# Patient Record
Sex: Female | Born: 1950
Health system: Southern US, Community
[De-identification: ages and names within clinical notes are randomized; demographics above are authoritative.]

## PROBLEM LIST (undated history)

## (undated) DIAGNOSIS — F419 Anxiety disorder, unspecified: Secondary | ICD-10-CM

## (undated) DIAGNOSIS — I639 Cerebral infarction, unspecified: Secondary | ICD-10-CM

## (undated) DIAGNOSIS — M1990 Unspecified osteoarthritis, unspecified site: Secondary | ICD-10-CM

## (undated) DIAGNOSIS — N6019 Diffuse cystic mastopathy of unspecified breast: Secondary | ICD-10-CM

## (undated) DIAGNOSIS — K219 Gastro-esophageal reflux disease without esophagitis: Secondary | ICD-10-CM

## (undated) DIAGNOSIS — F32A Depression, unspecified: Secondary | ICD-10-CM

## (undated) DIAGNOSIS — E785 Hyperlipidemia, unspecified: Secondary | ICD-10-CM

## (undated) DIAGNOSIS — S82209A Unspecified fracture of shaft of unspecified tibia, initial encounter for closed fracture: Secondary | ICD-10-CM

## (undated) DIAGNOSIS — M8580 Other specified disorders of bone density and structure, unspecified site: Secondary | ICD-10-CM

## (undated) DIAGNOSIS — T7840XA Allergy, unspecified, initial encounter: Secondary | ICD-10-CM

## (undated) DIAGNOSIS — E669 Obesity, unspecified: Secondary | ICD-10-CM

## (undated) HISTORY — DX: Unspecified osteoarthritis, unspecified site: M19.90

## (undated) HISTORY — PX: BREAST CYST ASPIRATION: SHX578

## (undated) HISTORY — DX: Depression, unspecified: F32.A

## (undated) HISTORY — DX: Unspecified fracture of shaft of unspecified tibia, initial encounter for closed fracture: S82.209A

## (undated) HISTORY — DX: Obesity, unspecified: E66.9

## (undated) HISTORY — PX: TUBAL LIGATION: SHX77

## (undated) HISTORY — DX: Diffuse cystic mastopathy of unspecified breast: N60.19

## (undated) HISTORY — DX: Allergy, unspecified, initial encounter: T78.40XA

## (undated) HISTORY — DX: Cerebral infarction, unspecified: I63.9

## (undated) HISTORY — DX: Hyperlipidemia, unspecified: E78.5

## (undated) HISTORY — DX: Anxiety disorder, unspecified: F41.9

## (undated) HISTORY — DX: Other specified disorders of bone density and structure, unspecified site: M85.80

## (undated) HISTORY — DX: Gastro-esophageal reflux disease without esophagitis: K21.9

---
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
## 1974-08-03 HISTORY — PX: TUBAL LIGATION: SHX77

## 2000-10-21 ENCOUNTER — Encounter: Admission: RE | Admit: 2000-10-21 | Discharge: 2000-10-21 | Payer: Self-pay | Admitting: Obstetrics and Gynecology

## 2000-10-21 ENCOUNTER — Encounter: Payer: Self-pay | Admitting: Obstetrics and Gynecology

## 2005-09-16 ENCOUNTER — Ambulatory Visit: Payer: Self-pay | Admitting: Family Medicine

## 2005-10-26 ENCOUNTER — Encounter: Admission: RE | Admit: 2005-10-26 | Discharge: 2005-10-26 | Payer: Self-pay | Admitting: Family Medicine

## 2005-11-24 ENCOUNTER — Ambulatory Visit: Payer: Self-pay | Admitting: Family Medicine

## 2005-11-24 ENCOUNTER — Encounter: Payer: Self-pay | Admitting: Family Medicine

## 2005-11-24 ENCOUNTER — Other Ambulatory Visit: Admission: RE | Admit: 2005-11-24 | Discharge: 2005-11-24 | Payer: Self-pay | Admitting: Family Medicine

## 2005-12-08 ENCOUNTER — Ambulatory Visit: Payer: Self-pay | Admitting: Family Medicine

## 2006-03-25 ENCOUNTER — Ambulatory Visit: Payer: Self-pay | Admitting: Family Medicine

## 2006-08-24 ENCOUNTER — Ambulatory Visit: Payer: Self-pay | Admitting: Family Medicine

## 2006-09-24 ENCOUNTER — Ambulatory Visit: Payer: Self-pay | Admitting: Family Medicine

## 2006-12-13 ENCOUNTER — Encounter: Payer: Self-pay | Admitting: Family Medicine

## 2006-12-13 DIAGNOSIS — E785 Hyperlipidemia, unspecified: Secondary | ICD-10-CM | POA: Insufficient documentation

## 2006-12-13 DIAGNOSIS — J309 Allergic rhinitis, unspecified: Secondary | ICD-10-CM | POA: Insufficient documentation

## 2006-12-13 DIAGNOSIS — K219 Gastro-esophageal reflux disease without esophagitis: Secondary | ICD-10-CM | POA: Insufficient documentation

## 2006-12-13 DIAGNOSIS — M1990 Unspecified osteoarthritis, unspecified site: Secondary | ICD-10-CM | POA: Insufficient documentation

## 2006-12-22 ENCOUNTER — Ambulatory Visit: Payer: Self-pay | Admitting: Family Medicine

## 2006-12-23 ENCOUNTER — Encounter: Payer: Self-pay | Admitting: Family Medicine

## 2006-12-24 LAB — CONVERTED CEMR LAB
ALT: 17 units/L (ref 0–35)
Albumin: 4.5 g/dL (ref 3.5–5.2)
Basophils Absolute: 0.1 10*3/uL (ref 0.0–0.1)
Basophils Relative: 1 % (ref 0–1)
Chloride: 102 meq/L (ref 96–112)
Cholesterol: 194 mg/dL (ref 0–200)
Eosinophils Absolute: 0.1 10*3/uL (ref 0.0–0.7)
HDL: 47 mg/dL (ref >39)
MCHC: 33.8 g/dL (ref 30.0–36.0)
MCV: 90 fL (ref 78.0–100.0)
Neutro Abs: 5 10*3/uL (ref 1.7–7.7)
Neutrophils Relative %: 66 % (ref 43–77)
Platelets: 245 10*3/uL (ref 150–400)
Potassium: 4.3 meq/L (ref 3.5–5.3)
Sodium: 138 meq/L (ref 135–145)
Total CHOL/HDL Ratio: 4.1
Total Protein: 7.5 g/dL (ref 6.0–8.3)
Triglycerides: 83 mg/dL (ref <150)
VLDL: 17 mg/dL (ref 0–40)

## 2006-12-25 LAB — CONVERTED CEMR LAB: OCCULT 2: NEGATIVE

## 2007-01-03 ENCOUNTER — Ambulatory Visit: Payer: Self-pay | Admitting: Family Medicine

## 2007-01-05 ENCOUNTER — Encounter (INDEPENDENT_AMBULATORY_CARE_PROVIDER_SITE_OTHER): Payer: Self-pay | Admitting: *Deleted

## 2007-01-07 ENCOUNTER — Encounter: Admission: RE | Admit: 2007-01-07 | Discharge: 2007-01-07 | Payer: Self-pay | Admitting: Family Medicine

## 2007-01-14 ENCOUNTER — Encounter (INDEPENDENT_AMBULATORY_CARE_PROVIDER_SITE_OTHER): Payer: Self-pay | Admitting: *Deleted

## 2007-06-28 ENCOUNTER — Ambulatory Visit: Payer: Self-pay | Admitting: Family Medicine

## 2007-09-30 ENCOUNTER — Ambulatory Visit: Payer: Self-pay | Admitting: Family Medicine

## 2007-12-27 ENCOUNTER — Ambulatory Visit: Payer: Self-pay | Admitting: Family Medicine

## 2007-12-28 LAB — CONVERTED CEMR LAB
ALT: 14 units/L (ref 0–35)
Alkaline Phosphatase: 82 units/L (ref 39–117)
BUN: 17 mg/dL (ref 6–23)
Basophils Relative: 1 % (ref 0–1)
Cholesterol: 186 mg/dL (ref 0–200)
Creatinine, Ser: 0.8 mg/dL (ref 0.40–1.20)
Eosinophils Absolute: 0.1 10*3/uL (ref 0.0–0.7)
Indirect Bilirubin: 0.3 mg/dL (ref 0.0–0.9)
MCHC: 34 g/dL (ref 30.0–36.0)
MCV: 90.1 fL (ref 78.0–100.0)
Monocytes Absolute: 0.4 10*3/uL (ref 0.1–1.0)
Monocytes Relative: 5 % (ref 3–12)
Neutrophils Relative %: 70 % (ref 43–77)
Potassium: 4.9 meq/L (ref 3.5–5.3)
RBC: 4.76 M/uL (ref 3.87–5.11)
RDW: 13.5 % (ref 11.5–15.5)
Total Protein: 7.1 g/dL (ref 6.0–8.3)
Triglycerides: 56 mg/dL (ref <150)

## 2008-01-11 ENCOUNTER — Encounter: Admission: RE | Admit: 2008-01-11 | Discharge: 2008-01-11 | Payer: Self-pay | Admitting: Family Medicine

## 2008-01-13 ENCOUNTER — Encounter (INDEPENDENT_AMBULATORY_CARE_PROVIDER_SITE_OTHER): Payer: Self-pay | Admitting: *Deleted

## 2008-01-16 ENCOUNTER — Encounter (INDEPENDENT_AMBULATORY_CARE_PROVIDER_SITE_OTHER): Payer: Self-pay | Admitting: *Deleted

## 2008-01-20 ENCOUNTER — Ambulatory Visit: Payer: Self-pay | Admitting: Family Medicine

## 2008-07-17 ENCOUNTER — Ambulatory Visit: Payer: Self-pay | Admitting: Family Medicine

## 2008-07-17 LAB — CONVERTED CEMR LAB
Bilirubin Urine: NEGATIVE
Epithelial cells, urine: 1 /lpf
Ketones, urine, test strip: NEGATIVE
Protein, U semiquant: NEGATIVE
RBC / HPF: 0
Urobilinogen, UA: 0.2
WBC, UA: 0 cells/hpf

## 2008-07-19 ENCOUNTER — Encounter: Admission: RE | Admit: 2008-07-19 | Discharge: 2008-07-19 | Payer: Self-pay | Admitting: Family Medicine

## 2008-07-20 ENCOUNTER — Telehealth (INDEPENDENT_AMBULATORY_CARE_PROVIDER_SITE_OTHER): Payer: Self-pay | Admitting: *Deleted

## 2009-02-08 ENCOUNTER — Encounter: Admission: RE | Admit: 2009-02-08 | Discharge: 2009-02-08 | Payer: Self-pay | Admitting: Family Medicine

## 2009-02-11 ENCOUNTER — Encounter (INDEPENDENT_AMBULATORY_CARE_PROVIDER_SITE_OTHER): Payer: Self-pay | Admitting: *Deleted

## 2009-03-12 ENCOUNTER — Encounter: Payer: Self-pay | Admitting: Family Medicine

## 2009-03-12 ENCOUNTER — Other Ambulatory Visit: Admission: RE | Admit: 2009-03-12 | Discharge: 2009-03-12 | Payer: Self-pay | Admitting: Family Medicine

## 2009-03-12 ENCOUNTER — Ambulatory Visit: Payer: Self-pay | Admitting: Family Medicine

## 2009-03-12 DIAGNOSIS — M8580 Other specified disorders of bone density and structure, unspecified site: Secondary | ICD-10-CM | POA: Insufficient documentation

## 2009-03-12 LAB — HM PAP SMEAR

## 2009-03-14 LAB — CONVERTED CEMR LAB
AST: 17 units/L (ref 0–37)
BUN: 16 mg/dL (ref 6–23)
CO2: 24 meq/L (ref 19–32)
Calcium: 9.4 mg/dL (ref 8.4–10.5)
Chloride: 106 meq/L (ref 96–112)
Cholesterol: 202 mg/dL — ABNORMAL HIGH (ref 0–200)
Creatinine, Ser: 0.86 mg/dL (ref 0.40–1.20)
Eosinophils Absolute: 0.1 10*3/uL (ref 0.0–0.7)
Eosinophils Relative: 2 % (ref 0–5)
Glucose, Bld: 95 mg/dL (ref 70–99)
HCT: 41.3 % (ref 36.0–46.0)
HDL: 49 mg/dL (ref >39)
Lymphs Abs: 1.8 10*3/uL (ref 0.7–4.0)
MCV: 89.4 fL (ref 78.0–100.0)
Monocytes Absolute: 0.4 10*3/uL (ref 0.1–1.0)
Platelets: 238 10*3/uL (ref 150–400)
RDW: 13.2 % (ref 11.5–15.5)
Total CHOL/HDL Ratio: 4.1
Triglycerides: 88 mg/dL (ref <150)
WBC: 7 10*3/uL (ref 4.0–10.5)

## 2009-03-15 ENCOUNTER — Encounter (INDEPENDENT_AMBULATORY_CARE_PROVIDER_SITE_OTHER): Payer: Self-pay | Admitting: *Deleted

## 2009-03-21 ENCOUNTER — Encounter (INDEPENDENT_AMBULATORY_CARE_PROVIDER_SITE_OTHER): Payer: Self-pay | Admitting: *Deleted

## 2009-03-27 ENCOUNTER — Ambulatory Visit: Payer: Self-pay | Admitting: Family Medicine

## 2009-03-27 LAB — CONVERTED CEMR LAB
OCCULT 2: NEGATIVE
OCCULT 3: NEGATIVE

## 2009-03-28 ENCOUNTER — Encounter (INDEPENDENT_AMBULATORY_CARE_PROVIDER_SITE_OTHER): Payer: Self-pay | Admitting: *Deleted

## 2009-03-28 LAB — FECAL OCCULT BLOOD, GUAIAC: Fecal Occult Blood: NEGATIVE

## 2010-02-14 ENCOUNTER — Encounter: Admission: RE | Admit: 2010-02-14 | Discharge: 2010-02-14 | Payer: Self-pay | Admitting: Family Medicine

## 2010-02-19 ENCOUNTER — Encounter: Payer: Self-pay | Admitting: Family Medicine

## 2010-03-13 ENCOUNTER — Ambulatory Visit: Payer: Self-pay | Admitting: Family Medicine

## 2010-03-14 ENCOUNTER — Telehealth (INDEPENDENT_AMBULATORY_CARE_PROVIDER_SITE_OTHER): Payer: Self-pay | Admitting: *Deleted

## 2010-03-14 LAB — CONVERTED CEMR LAB
CO2: 26 meq/L (ref 19–32)
Calcium: 9.7 mg/dL (ref 8.4–10.5)
Chloride: 105 meq/L (ref 96–112)
Cholesterol: 189 mg/dL (ref 0–200)
Creatinine, Ser: 0.82 mg/dL (ref 0.40–1.20)
Eosinophils Relative: 2 % (ref 0–5)
Glucose, Bld: 92 mg/dL (ref 70–99)
HCT: 41.7 % (ref 36.0–46.0)
Hemoglobin: 14.2 g/dL (ref 12.0–15.0)
Lymphocytes Relative: 33 % (ref 12–46)
Lymphs Abs: 2.2 10*3/uL (ref 0.7–4.0)
Monocytes Absolute: 0.5 10*3/uL (ref 0.1–1.0)
Neutro Abs: 3.8 10*3/uL (ref 1.7–7.7)
RBC: 4.61 M/uL (ref 3.87–5.11)
Total Bilirubin: 0.6 mg/dL (ref 0.3–1.2)
Total CHOL/HDL Ratio: 3.7
Total Protein: 6.5 g/dL (ref 6.0–8.3)
Triglycerides: 63 mg/dL (ref <150)
VLDL: 13 mg/dL (ref 0–40)
Vit D, 25-Hydroxy: 42 ng/mL (ref 30–89)
WBC: 6.7 10*3/uL (ref 4.0–10.5)

## 2010-03-24 ENCOUNTER — Ambulatory Visit: Payer: Self-pay | Admitting: Family Medicine

## 2010-03-26 ENCOUNTER — Encounter: Payer: Self-pay | Admitting: Family Medicine

## 2010-03-31 ENCOUNTER — Ambulatory Visit: Payer: Self-pay | Admitting: Family Medicine

## 2010-03-31 ENCOUNTER — Encounter: Admission: RE | Admit: 2010-03-31 | Discharge: 2010-03-31 | Payer: Self-pay | Admitting: Family Medicine

## 2010-04-01 ENCOUNTER — Encounter (INDEPENDENT_AMBULATORY_CARE_PROVIDER_SITE_OTHER): Payer: Self-pay | Admitting: *Deleted

## 2010-04-01 LAB — CONVERTED CEMR LAB: Fecal Occult Bld: NEGATIVE

## 2010-07-02 ENCOUNTER — Ambulatory Visit: Payer: Self-pay | Admitting: Family Medicine

## 2010-08-03 DIAGNOSIS — S82209A Unspecified fracture of shaft of unspecified tibia, initial encounter for closed fracture: Secondary | ICD-10-CM

## 2010-08-03 HISTORY — DX: Unspecified fracture of shaft of unspecified tibia, initial encounter for closed fracture: S82.209A

## 2010-08-03 HISTORY — PX: COLONOSCOPY: SHX174

## 2010-09-02 NOTE — Letter (Signed)
Summary: [facility]: Immunoassay Fecal Occult Blood (iFOB) Order Form  [facility]  [address] [place], [place] [address]   Phone: [phone]  Fax: [phone]      [facility]: Immunoassay Fecal Occult Blood (iFOB) Order Form   March 24, 2010 MRN: [id number]   Brianna Espinoza 15-Jul-1951   Physicican Name:______Tower___________________  Diagnosis Code:__________V76.51________________      Judith Part MD

## 2010-09-02 NOTE — Assessment & Plan Note (Signed)
Summary: F/U DISCUSS BONE DENSITY RESULTS/RI   Vital Signs:  Patient profile:   60 year old female Height:      61 inches Weight:      168.25 pounds BMI:     31.91 Temp:     98.3 degrees F oral Pulse rate:   68 / minute Pulse rhythm:   regular BP sitting:   132 / 84  (left arm) Cuff size:   regular  Vitals Entered By: Lewanda Rife LPN (July 02, 2010 8:05 AM) CC: discuss bone density   History of Present Illness: here to disc dexa showing osteopenia  LS and FN both showed T scores of -1.6   does have OP in family- her mother   she herself broke wrist on ice in 1985  no other breaks   exercises every day -- which is good     on ca and D vit D level is 42-- went up from year before  4 calcium gummies per day -- ? mg    suspect 1200 mg ca per day and also vit D 2000 international units   she thinks her insurance would cover fosamax    bp is labile at home but mostly below 140/90 disc checking only when relaxed- not just after exercise    Allergies: 1)  ! * Estrogen Cream 2)  * Tetanus 3)  Tylenol  Past History:  Past Medical History: Last updated: 03/24/2010 Allergic rhinitis GERD Hyperlipidemia Osteoarthritis fibrocystic breasts  obesity osteopenia    dermPurcell Nails   Past Surgical History: Last updated: 09/30/2007 Tubal ligation/ Lap (1976) Right hernia repair - very young Aspriated breast lump (1980"s)  Family History: Last updated: 03/24/2010 Father: CVA, prostate cancer, CAD- died of MI Mother: Arthritis, increased chol, HTN, OP (died of pneumonia) Siblings:  both grandparents lived to be 67  Social History: Last updated: 12/27/2007 Marital Status: Married Children: 1 Occupation: works at [employer] for exercise, also walks and gardens   Risk Factors: Smoking Status: never (12/13/2006)  Review of Systems General:  Denies fatigue, loss of appetite, and malaise. Eyes:  Denies blurring and eye irritation. CV:  Denies  chest pain or discomfort, lightheadness, palpitations, and shortness of breath with exertion. Resp:  Denies cough. MS:  Denies joint pain, joint redness, joint swelling, and stiffness. Derm:  Denies itching, lesion(s), poor wound healing, and rash. Neuro:  Denies numbness and tingling. Psych:  mood is ok . Endo:  Denies cold intolerance, excessive thirst, excessive urination, and heat intolerance. Heme:  Denies abnormal bruising and bleeding.  Physical Exam  General:  overweight but generally well appearing  Head:  normocephalic, atraumatic, and no abnormalities observed.   Mouth:  pharynx pink and moist.   Neck:  supple with full rom and no masses or thyromegally, no JVD or carotid bruit  Lungs:  Normal respiratory effort, chest expands symmetrically. Lungs are clear to auscultation, no crackles or wheezes. Heart:  Normal rate and regular rhythm. S1 and S2 normal without gallop, murmur, click, rub or other extra sounds. Msk:  no kyphosis no acute joint changes  petite frame Extremities:  No clubbing, cyanosis, edema, or deformity noted with normal full range of motion of all joints.   Skin:  Intact without suspicious lesions or rashes Cervical Nodes:  No lymphadenopathy noted Psych:  normal affect, talkative and pleasant    Impression & Recommendations:  Problem # 1:  OSTEOPENIA (ICD-733.90) Assessment Deteriorated  this has worsened in light of petite frame/ mother with  OP and also T score under -1.5 will tx with bisphosphenate  if side eff- primarily GI- will call and update disc imp of exercise and ca and d-- and will re check dexa in 2 y recent dexa rev in detail Her updated medication list for this problem includes:    Fosamax 70 Mg Tabs (Alendronate sodium) .Marland Kitchen... 1 by mouth once daily  Orders: Prescription Created Electronically [phone])  Complete Medication List: 1)  Multiple Vitamins Tabs (Multiple vitamin) .... Take one by mouth as directed 2)  Prilosec 20 Mg Cpdr  (Omeprazole) .Marland Kitchen.. 1 by mouth every day as needed 3)  Vitamin D 1200 Mg  .... Take 1 tablet by mouth once a day 4)  Calcium With Vitamin D Gummies  .... Chew four gummies a day 5)  Chlor-trimeton 4 Mg Tabs (Chlorpheniramine maleate) .... Otc as directed. 6)  Fosamax 70 Mg Tabs (Alendronate sodium) .Marland Kitchen.. 1 by mouth once daily  Patient Instructions: 1)  the current recommendation for calcium intake is 1200-1500 mg daily with 2000  IU of vitamin D  2)  try the fosamax once weekly 3)  if any side effects like reflux/ chest pain or problems swallowing  4)  continue the exercise  5)  we will plan next dexa in 2 years Prescriptions: FOSAMAX 70 MG TABS (ALENDRONATE SODIUM) 1 by mouth once daily  #4 x 11   Entered and Authorized by:   Judith Part MD   Signed by:   Judith Part MD on 07/02/2010   Method used:   Electronically to        [facility] [phone]* (retail)       [address] [place], [place]  [address]       Ph: [phone]       Fax: [phone]   RxID:   [id number]    Orders Added: 1)  Prescription Created Electronically [G8553] 2)  Est. Patient Level III [40347]    Current Allergies (reviewed today): ! * ESTROGEN CREAM * TETANUS TYLENOL

## 2010-09-02 NOTE — Miscellaneous (Signed)
Summary: mammogram screening  Clinical Lists Changes  Observations: Added new observation of MAMMO DUE: 02/2011 (02/19/2010 16:55) Added new observation of MAMMOGRAM: normal (02/14/2010 16:55)      Preventive Care Screening  Mammogram:    Date:  02/14/2010    Next Due:  02/2011    Results:  normal

## 2010-09-02 NOTE — Progress Notes (Signed)
----   Converted from flag ---- ---- 03/14/2010 8:54 AM, Colon Flattery Tower MD wrote: please check lipid, wellness and vit D v70.0 and 733.0  ---- 03/12/2010 12:58 PM, Liane Comber CMA (AAMA) wrote: Pt is scheduled for cpx labs tomorrow, what labs to draw and dx codes? Thanks Tasha ------------------------------

## 2010-09-02 NOTE — Letter (Signed)
Summary: Results Follow up Letter  [facility]  [address] [place], [place] [address]   Phone: [phone]  Fax: [phone]    02/19/2010 MRN: [id number]    [facility] [place] [address] [place], [place]  [address]    Dear Ms. Coupland,  The following are the results of your recent test(s):  Test         Result    Pap Smear:        Normal _____  Not Normal _____ Comments: ______________________________________________________ Cholesterol: LDL(Bad cholesterol):         Your goal is less than:         HDL (Good cholesterol):       Your goal is more than: Comments:  ______________________________________________________ Mammogram:        Normal __X___  Not Normal _____ Comments:Repeat in one year.   ___________________________________________________________________ Hemoccult:        Normal _____  Not normal _______ Comments:    _____________________________________________________________________ Other Tests:    We routinely do not discuss normal results over the telephone.  If you desire a copy of the results, or you have any questions about this information we can discuss them at your next office visit.   Sincerely,    Idamae Schuller Dakoda [place],MD  MT/ri

## 2010-09-02 NOTE — Letter (Signed)
Summary: Results Follow up Letter  [facility]  [address] [place], [place] [address]   Phone: [phone]  Fax: [phone]    04/01/2010 MRN: [id number]    [facility] [place] [address] [place], [place]  [address]    Dear Ms. Forry,  The following are the results of your recent test(s):  Test         Result    Pap Smear:        Normal _____  Not Normal _____ Comments: ______________________________________________________ Cholesterol: LDL(Bad cholesterol):         Your goal is less than:         HDL (Good cholesterol):       Your goal is more than: Comments:  ______________________________________________________ Mammogram:        Normal _____  Not Normal _____ Comments:  ___________________________________________________________________ Hemoccult:        Normal __X___  Not normal _______ Comments:    _____________________________________________________________________ Other Tests:    We routinely do not discuss normal results over the telephone.  If you desire a copy of the results, or you have any questions about this information we can discuss them at your next office visit.   Sincerely,    Dwana Curd. Para March, M.D.  [facility]

## 2010-09-02 NOTE — Assessment & Plan Note (Signed)
Summary: CPX/CLE  R/S FROM 03/20/10   Vital Signs:  Patient profile:   60 year old female Height:      61 inches Weight:      168.25 pounds BMI:     31.91 Temp:     98.1 degrees F oral Pulse rate:   76 / minute Pulse rhythm:   regular BP sitting:   160 / 84  (left arm) Cuff size:   regular  Vitals Entered By: Lewanda Rife LPN (March 24, 2010 11:39 AM)  Serial Vital Signs/Assessments:  Time      Position  BP       Pulse  Resp  Temp     By                     130/80                         Judith Part MD  CC: 30 minute check up   History of Present Illness: here for wellness exam and to review chronic health problems feels good overall   had a rough year this year -- lost her mother - fall and pneumonia  hard on an only child  was in a funk- now getting back to nl  used hospice counselor  now has estate issues to deal with   bp is high today --160/84 has not checked outside the office - no symptoms at all  walks 5 days per week/ golf 3 d per week is thinking about retiring   wt is up 4lb (gained 13 and lost some)   lipids are ok trig 63 and HDL 51 and LDL 125- this is down a bit from last year  hx of fibrocystic breasts  mam was 7/11 self exam -- no new lumps or changes   hx of osteopenia  due dexa in this summer  on ca and vit D D level nl at 42 this check  Td allergic   colonosc - prev declined -- cannot tolerate prep    pap was 8/10 - normal  no hx of abn paps no symptoms or new partners       Allergies: 1)  ! * Estrogen Cream 2)  * Tetanus 3)  Tylenol  Past History:  Past Surgical History: Last updated: 09/30/2007 Tubal ligation/ Lap (1976) Right hernia repair - very young Aspriated breast lump (1980"s)  Family History: Last updated: 03/24/2010 Father: CVA, prostate cancer, CAD- died of MI Mother: Arthritis, increased chol, HTN, OP (died of pneumonia) Siblings:  both grandparents lived to be 4  Social History: Last updated:  12/27/2007 Marital Status: Married Children: 1 Occupation: works at [employer] for exercise, also walks and gardens   Risk Factors: Smoking Status: never (12/13/2006)  Past Medical History: Allergic rhinitis GERD Hyperlipidemia Osteoarthritis fibrocystic breasts  obesity osteopenia    dermPurcell Nails   Family History: Father: CVA, prostate cancer, CAD- died of MI Mother: Arthritis, increased chol, HTN, OP (died of pneumonia) Siblings:  both grandparents lived to be 76  Review of Systems General:  Complains of fatigue; denies fever, loss of appetite, and malaise. Eyes:  Denies blurring and eye irritation. CV:  Denies chest pain or discomfort, lightheadness, palpitations, and shortness of breath with exertion. Resp:  Denies cough and wheezing. GI:  Denies abdominal pain, change in bowel habits, indigestion, and nausea. GU:  Denies abnormal vaginal bleeding, discharge, dysuria, and urinary frequency. MS:  Denies  joint pain, muscle aches, and cramps. Derm:  Denies itching, lesion(s), poor wound healing, and rash. Neuro:  Denies numbness and tingling. Psych:  Denies panic attacks, sense of great danger, and suicidal thoughts/plans; still grieving- doing ok . Endo:  Denies cold intolerance, excessive thirst, excessive urination, and heat intolerance. Heme:  Denies abnormal bruising and bleeding.  Physical Exam  General:  overweight but generally well appearing  Head:  normocephalic, atraumatic, and no abnormalities observed.   Eyes:  vision grossly intact, pupils equal, pupils round, and pupils reactive to light.  no conjunctival pallor, injection or icterus  Ears:  R ear normal and L ear normal.   Nose:  no nasal discharge.   Mouth:  pharynx pink and moist.   Neck:  supple with full rom and no masses or thyromegally, no JVD or carotid bruit  Chest Wall:  No deformities, masses, or tenderness noted. Breasts:  No mass, nodules, thickening, tenderness, bulging,  retraction, inflamation, nipple discharge or skin changes noted.  (overall dense breast tissue) Lungs:  Normal respiratory effort, chest expands symmetrically. Lungs are clear to auscultation, no crackles or wheezes. Heart:  Normal rate and regular rhythm. S1 and S2 normal without gallop, murmur, click, rub or other extra sounds. Abdomen:  Bowel sounds positive,abdomen soft and non-tender without masses, organomegaly or hernias noted. no renal bruits  Msk:  No deformity or scoliosis noted of thoracic or lumbar spine.  no acute joint changes no kyphosis Pulses:  R and L carotid,radial,femoral,dorsalis pedis and posterior tibial pulses are full and equal bilaterally Extremities:  No clubbing, cyanosis, edema, or deformity noted with normal full range of motion of all joints.   Neurologic:  sensation intact to light touch, gait normal, and DTRs symmetrical and normal.   Skin:  Intact without suspicious lesions or rashes Cervical Nodes:  No lymphadenopathy noted Axillary Nodes:  No palpable lymphadenopathy Inguinal Nodes:  No significant adenopathy Psych:  normal affect, talkative and pleasant    Impression & Recommendations:  Problem # 1:  HEALTH MAINTENANCE EXAM (ICD-V70.0) Assessment Comment Only reviewed health habits including diet, exercise and skin cancer prevention reviewed health maintenance list and family history rev labs in detail today declines colonosc- stool immunoassay card given  Problem # 2:  OSTEOPENIA (ICD-733.90) Assessment: Unchanged  schedule 2 year dexa nl vit D level  continue ca and D and exercise   Orders: Radiology Referral (Radiology)  Problem # 3:  HYPERLIPIDEMIA (ICD-272.4) Assessment: Improved  this is improved with LDL of 125 -- commended disc good health habits - low sat fat diet   Labs Reviewed: SGOT: 17 (03/13/2010)   SGPT: 15 (03/13/2010)   HDL:51 (03/13/2010), 49 (03/12/2009)  LDL:125 (03/13/2010), 135 (03/12/2009)  Chol:189 (03/13/2010),  202 (03/12/2009)  Trig:63 (03/13/2010), 88 (03/12/2009)  Complete Medication List: 1)  Multiple Vitamins Tabs (Multiple vitamin) .... Take one by mouth as directed 2)  Prilosec 20 Mg Cpdr (Omeprazole) .Marland Kitchen.. 1 by mouth every day as needed 3)  Vitamin D 1200 Mg  .... Take 1 tablet by mouth once a day 4)  Calcium With Vitamin D Gummies  .... Chew four gummies a day  Other Orders: Prescription Created Electronically [phone])  Patient Instructions: 1)  we will schedule dexa at check out  2)  continue ca and vit D 3)  check blood pressure occasionally at home- it was better today on second check  Prescriptions: PRILOSEC 20 MG CPDR (OMEPRAZOLE) 1 by mouth every day as needed  #30 x 11   Entered  and Authorized by:   Judith Part MD   Signed by:   Judith Part MD on 03/24/2010   Method used:   Electronically to        [facility] [phone]* (retail)       [address] [place], [place]  [address]       Ph: [phone]       Fax: [phone]   RxID:   [id number]   Current Allergies (reviewed today): ! * ESTROGEN CREAM * TETANUS TYLENOL   Preventive Care Screening  Contraindications of Treatment or Deferment of Test/Procedure:    Test/Procedure: Colonoscopy    Reason for deferment: patient declined

## 2011-02-11 ENCOUNTER — Other Ambulatory Visit: Payer: Self-pay | Admitting: Family Medicine

## 2011-02-11 DIAGNOSIS — Z1231 Encounter for screening mammogram for malignant neoplasm of breast: Secondary | ICD-10-CM

## 2011-03-06 ENCOUNTER — Ambulatory Visit
Admission: RE | Admit: 2011-03-06 | Discharge: 2011-03-06 | Disposition: A | Discharge status: Home / Self Care | Payer: PRIVATE HEALTH INSURANCE | Source: Ambulatory Visit | Attending: Family Medicine | Admitting: Family Medicine

## 2011-03-06 DIAGNOSIS — Z1231 Encounter for screening mammogram for malignant neoplasm of breast: Secondary | ICD-10-CM

## 2011-03-07 IMAGING — MG MM SCREEN MAMMOGRAM BILATERAL
4 series · 4 of 4 positions shown · non-contrast
Comparison: none

Addendum Begins
Addendum
CORRECTION:  This exam was inadvertently entered under the wrong interpreting radiologist and is 
corrected to state Dr. Yuan Santo as the correct interpreting radiologist.
ANALYZED BY COMPUTER AIDED DETECTION. , THIS PROCEDURE WAS A DIGITAL MAMMOGRAM.
Addendum Ends
DG SCREEN MAMMOGRAM BILATERAL
Bilateral CC and MLO view(s) were taken.

DIGITAL SCREENING MAMMOGRAM WITH CAD:
There are scattered fibroglandular densities.  No masses or malignant type calcifications are 
identified.  Compared with prior studies.
Images were processed with CAD.

[R CC]
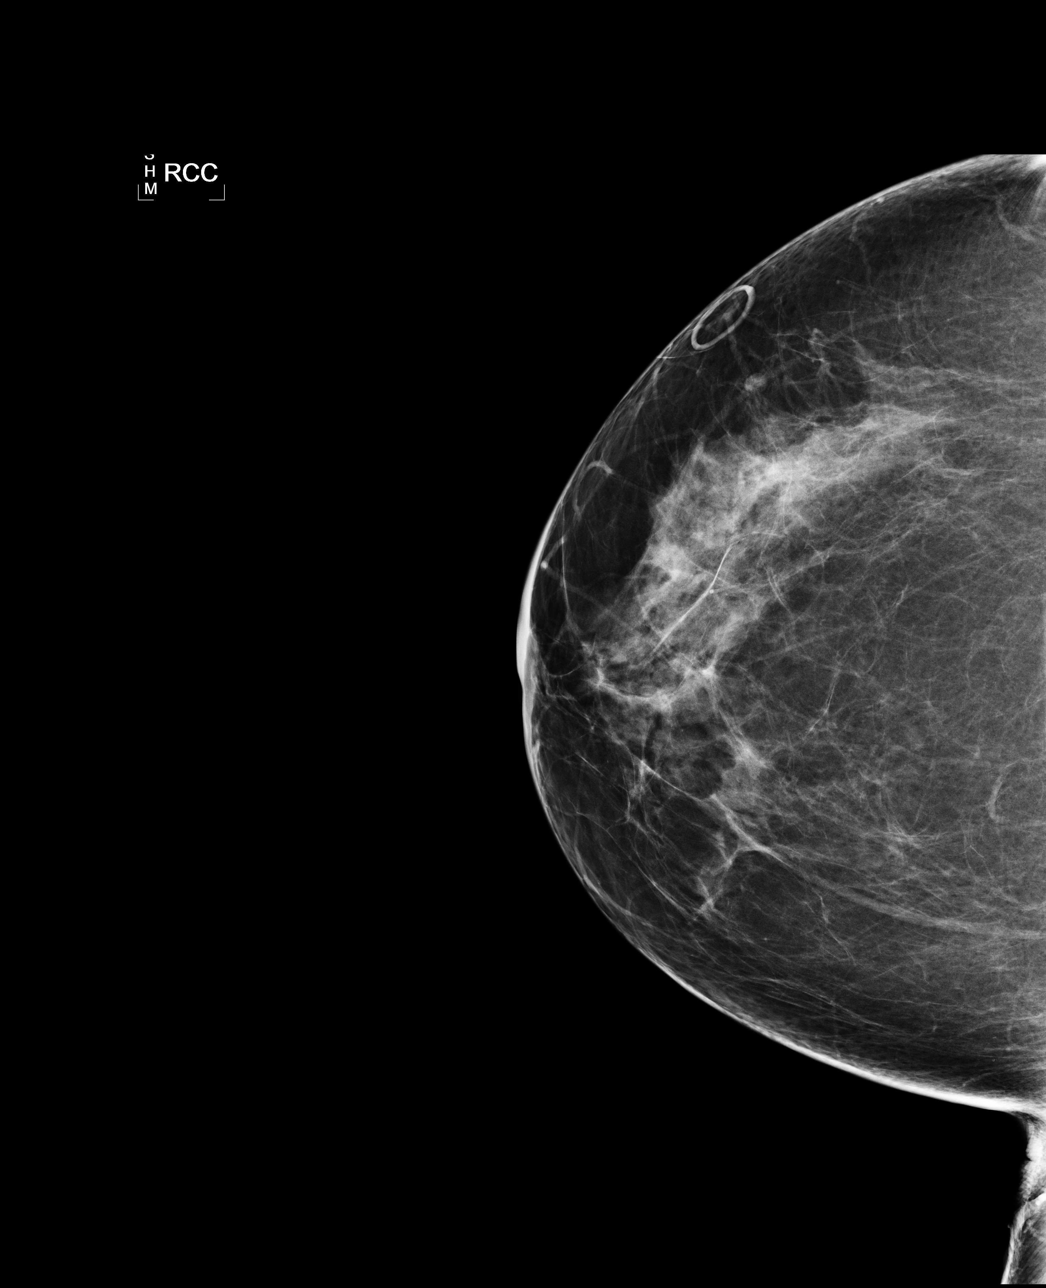

[L CC]
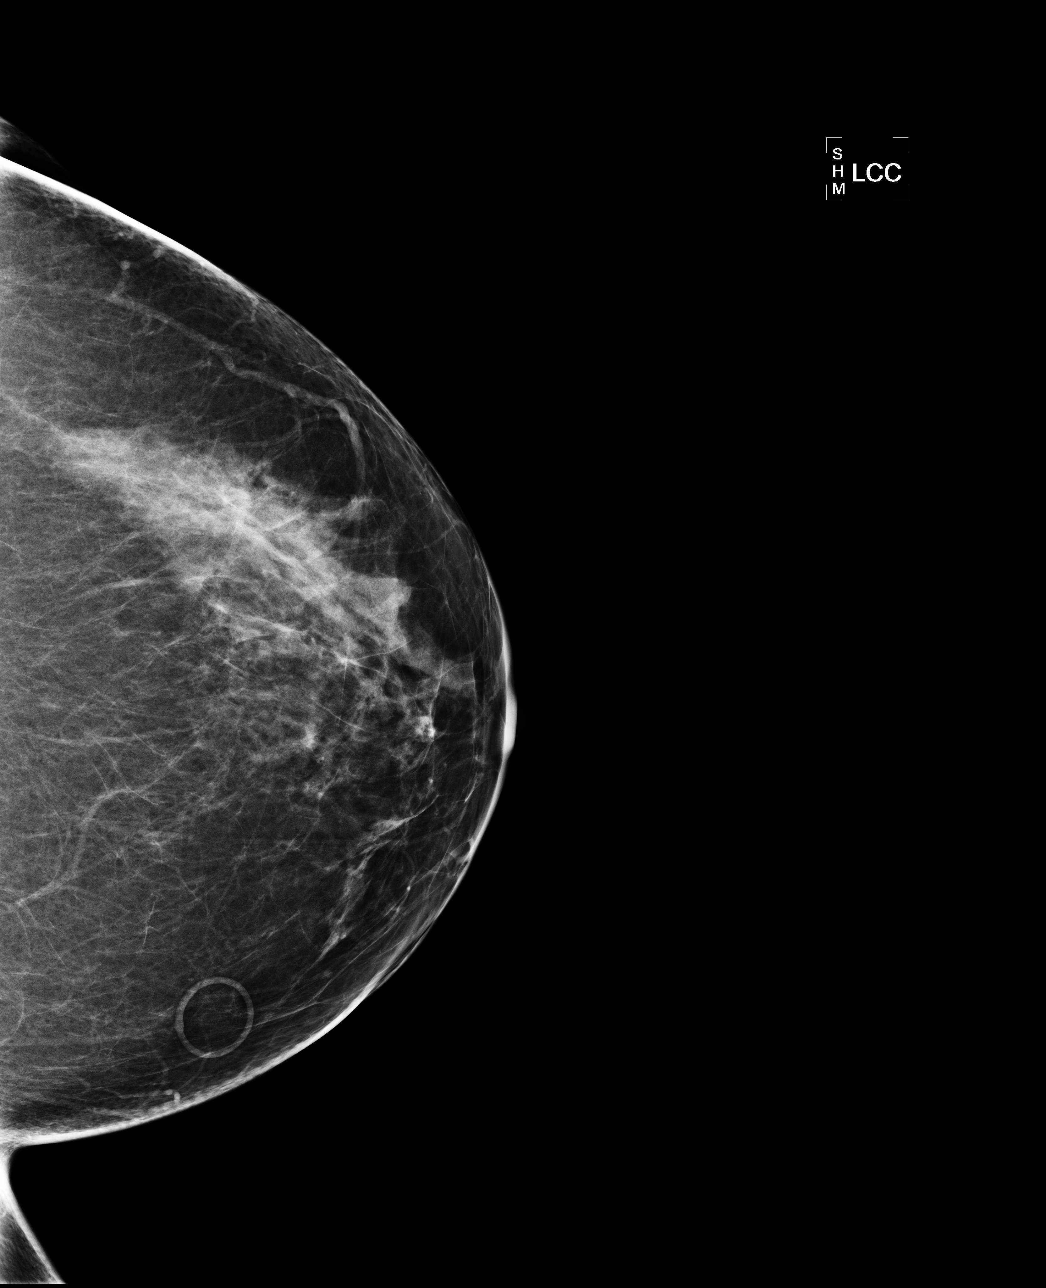

[L MLO]
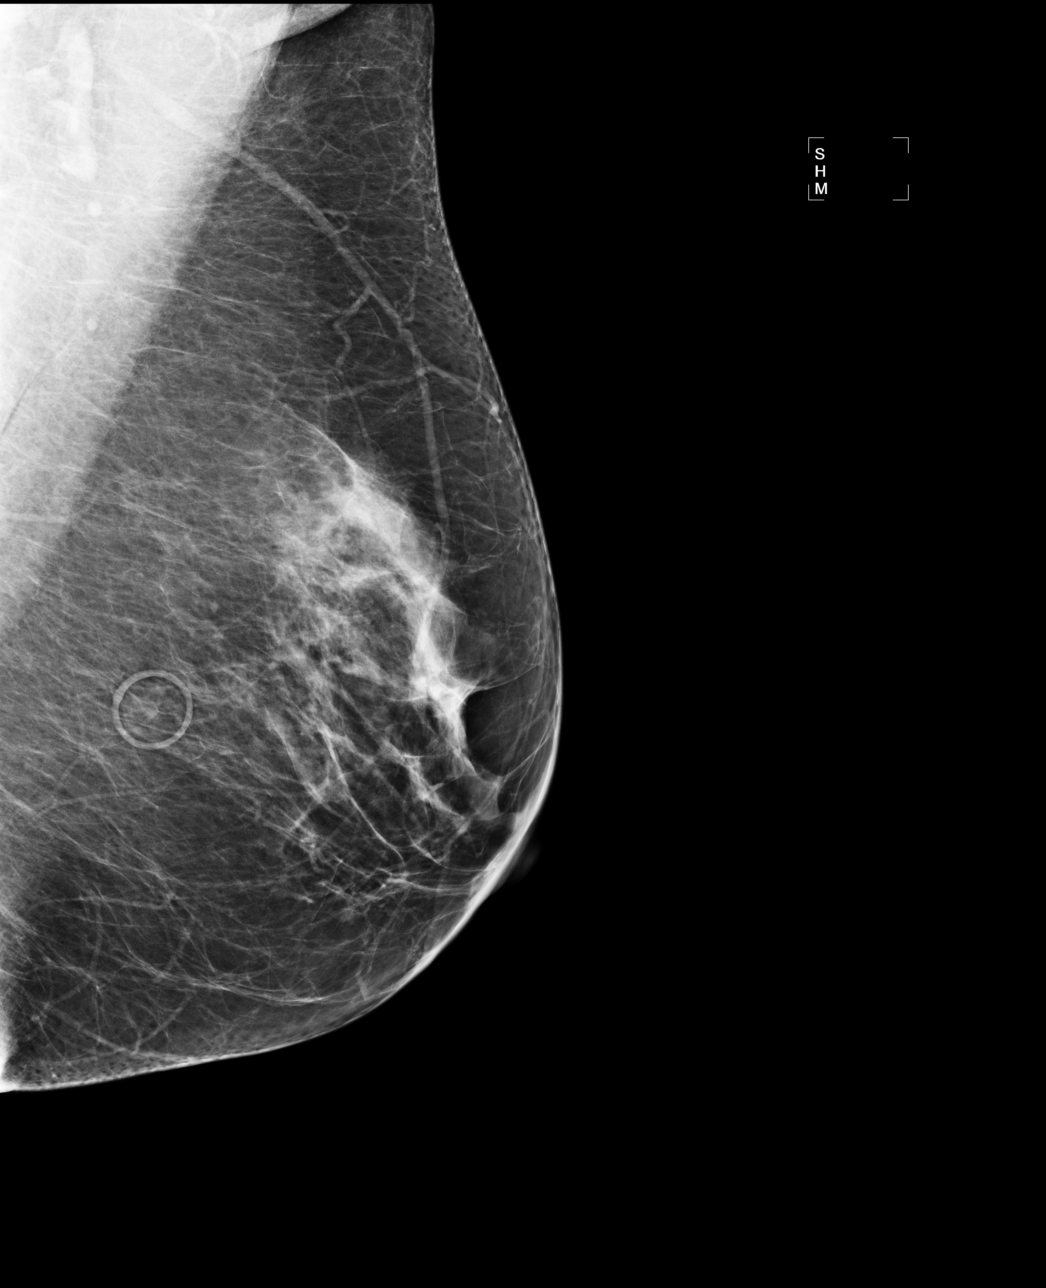

[R MLO]
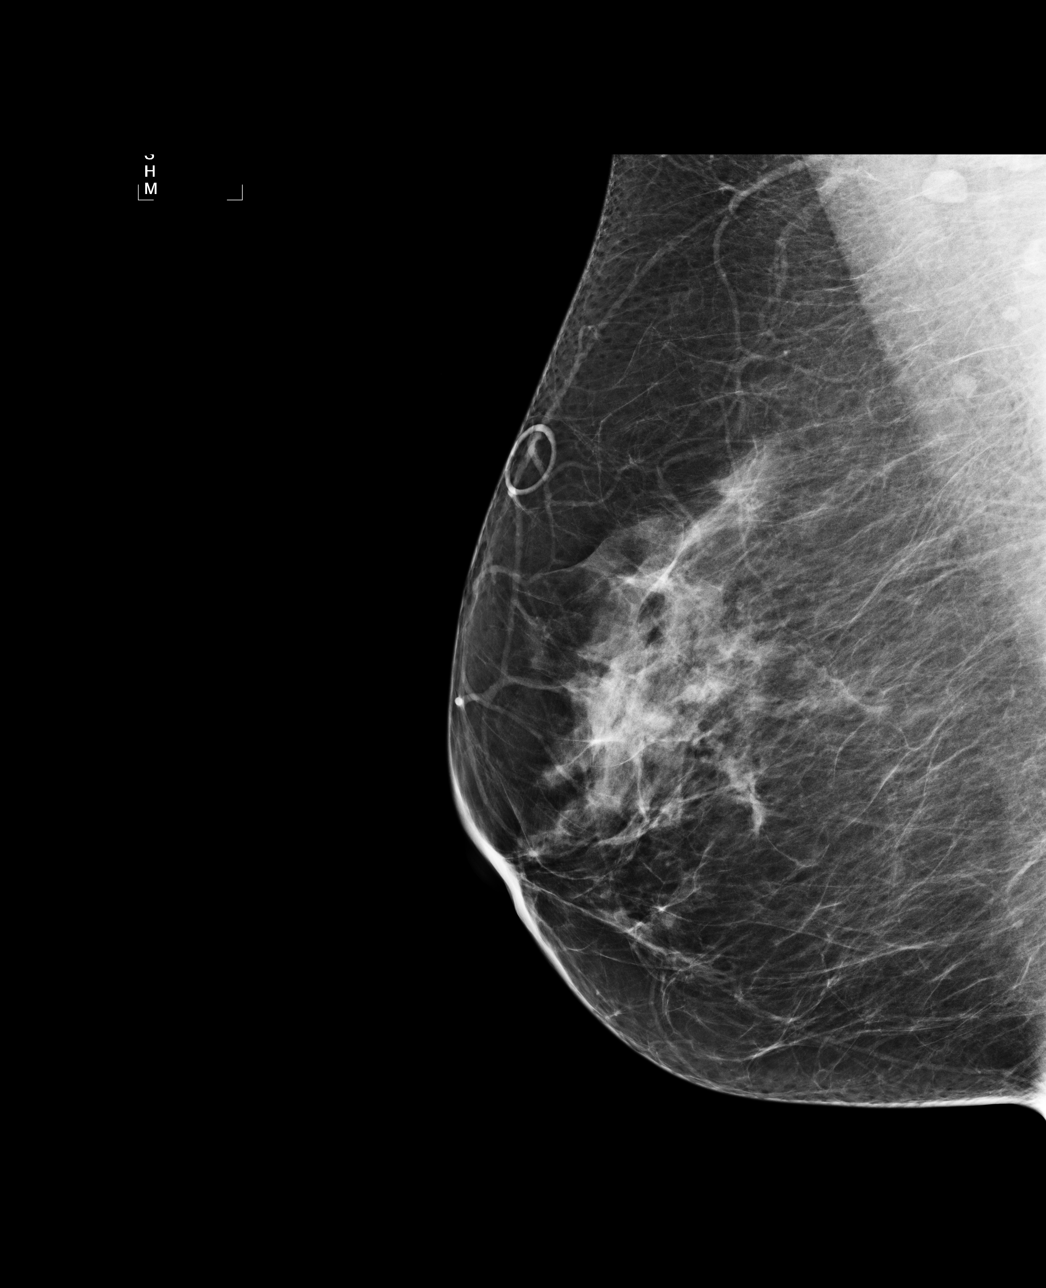

[4 of 4 positions shown; findings below may reference images not displayed]

IMPRESSION: No specific mammographic evidence of malignancy.  Next screening mammogram is recommended in one 
year.

A result letter of this screening mammogram will be mailed directly to the patient.

ASSESSMENT: Negative - BI-RADS 1

Screening mammogram in 1 year.
ANALYZED BY COMPUTER AIDED DETECTION. , THIS PROCEDURE WAS A DIGITAL MAMMOGRAM.

## 2011-04-13 ENCOUNTER — Telehealth: Payer: Self-pay | Admitting: *Deleted

## 2011-04-13 NOTE — Telephone Encounter (Signed)
Pt has appt to see you on 9/26 and she is asking for order to have labs done prior.  She can get the labs at work for free.  Please call when order is ready.

## 2011-04-14 NOTE — Telephone Encounter (Signed)
Orders done in IN box

## 2011-04-14 NOTE — Telephone Encounter (Signed)
Patient notified as instructed by telephone.lab order left at front desk.

## 2011-04-22 ENCOUNTER — Encounter: Payer: Self-pay | Admitting: Family Medicine

## 2011-04-27 ENCOUNTER — Encounter: Payer: Self-pay | Admitting: Family Medicine

## 2011-04-29 ENCOUNTER — Encounter: Payer: Self-pay | Admitting: Family Medicine

## 2011-04-29 ENCOUNTER — Encounter: Payer: Self-pay | Admitting: Gastroenterology

## 2011-04-29 ENCOUNTER — Ambulatory Visit (INDEPENDENT_AMBULATORY_CARE_PROVIDER_SITE_OTHER): Payer: PRIVATE HEALTH INSURANCE | Admitting: Family Medicine

## 2011-04-29 VITALS — BP 140/90 | HR 76 | Temp 98.3°F | Ht 61.0 in | Wt 167.5 lb

## 2011-04-29 DIAGNOSIS — Z23 Encounter for immunization: Secondary | ICD-10-CM

## 2011-04-29 DIAGNOSIS — M25559 Pain in unspecified hip: Secondary | ICD-10-CM

## 2011-04-29 DIAGNOSIS — M25551 Pain in right hip: Secondary | ICD-10-CM

## 2011-04-29 DIAGNOSIS — E785 Hyperlipidemia, unspecified: Secondary | ICD-10-CM

## 2011-04-29 DIAGNOSIS — Z1211 Encounter for screening for malignant neoplasm of colon: Secondary | ICD-10-CM

## 2011-04-29 DIAGNOSIS — M899 Disorder of bone, unspecified: Secondary | ICD-10-CM

## 2011-04-29 DIAGNOSIS — M949 Disorder of cartilage, unspecified: Secondary | ICD-10-CM

## 2011-04-29 DIAGNOSIS — Z0000 Encounter for general adult medical examination without abnormal findings: Secondary | ICD-10-CM

## 2011-04-29 MED ORDER — ALENDRONATE SODIUM 70 MG PO TABS: 70.0000 mg | ORAL_TABLET | ORAL | Status: AC

## 2011-04-29 MED ORDER — ESTROGENS, CONJUGATED 0.625 MG/GM VA CREA: TOPICAL_CREAM | VAGINAL | Status: DC

## 2011-04-29 MED ORDER — OMEPRAZOLE 20 MG PO CPDR: 20.0000 mg | DELAYED_RELEASE_CAPSULE | Freq: Every day | ORAL | Status: DC

## 2011-04-29 NOTE — Assessment & Plan Note (Signed)
Pain in R groin with activity Suspect hip OA Will f/u 2 wk for hip film and adv

## 2011-04-29 NOTE — Progress Notes (Signed)
Subjective:    Patient ID: Brianna Espinoza, female    DOB: Oct 12, 1950, 60 y.o.   MRN: [id number]  HPI Here for annual health mt exam and to disc chronic health problems  Is doing well in general   Thinks she pulled a muscle in her R side -- after lifting heavy boxes  Burning pain in R groin and at times sharp  Is worse if she is doing something  Is intermittent  Does have and old hip problem  Never had x ray of hip   Fractured tibia in spring -was playing golf- stepped in a hole  Saw ortho in [place] - and then dx   Did retire in march- loves it and keeps busy Trying to settle her mother's estate   [place] is stable with 31 bmi  Lipids were fair with trig of 65 and HDL of 56 and LDL 127 Other labs ok  Is eating a very healthy diet   Exercise - plays golf and walks on her treadmill (as soon as she got out of a cast)   Pap nl 8/10 Hx - no abn paps , no new sexual partner   Tdap- allergic Flu shot - decided to get  Zoster status- never had it   Mam 8/12 - normal  Self exam- no lumps  Hx fibrocystic breasts   Had her derm check up - some AKs frozen Had annual eye exam   bp today 140/90  Osteopenia 8/11 dexa with T -1.6 fx of tibia- ? If considered a fragility fracture  Vit D level is 49 Was on fosamax - and stopped it after her fx -- (on since aug of 2010)   Patient Active Problem List  Diagnoses  . HYPERLIPIDEMIA  . ALLERGIC RHINITIS  . GERD  . OSTEOARTHRITIS  . OSTEOPENIA  . Routine general medical examination at a health care facility  . Right hip pain  . Special screening for malignant neoplasms, colon   Past Medical History  Diagnosis Date  . Allergy     allergic rhinitis  . GERD (gastroesophageal reflux disease)   . Hyperlipidemia   . Arthritis     OA  . Obesity   . Fibrocystic breast   . Osteopenia    Past Surgical History  Procedure Date  . Tubal ligation 1976    lap  . Hernia repair     right  . Breast surgery 1980s    aspirated breast lump    History  Substance Use Topics  . Smoking status: Never Smoker   . Smokeless tobacco: Not on file  . Alcohol Use: Not on file   Family History  Problem Relation Age of Onset  . Arthritis Mother   . Hyperlipidemia Mother   . Hypertension Mother   . Stroke Father   . Cancer Father     prostate CA  . Heart disease Father    Allergies  Allergen Reactions  . Acetaminophen     REACTION: reaction not known  . Tetanus Toxoid     REACTION: reaction not known   No current outpatient prescriptions on file prior to visit.         Review of Systems Review of Systems  Constitutional: Negative for fever, appetite change, fatigue and unexpected weight change.  Eyes: Negative for pain and visual disturbance.  Respiratory: Negative for cough and shortness of breath.   Cardiovascular: Negative for cp or palpitations    Gastrointestinal: Negative for nausea, diarrhea and constipation.  Genitourinary: Negative  for urgency and frequency.  Skin: Negative for pallor or rash   MSK pos for groin/ hip pain  Neurological: Negative for weakness, light-headedness, numbness and headaches.  Hematological: Negative for adenopathy. Does not bruise/bleed easily.  Psychiatric/Behavioral: Negative for dysphoric mood. The patient is not nervous/anxious.          Objective:   Physical Exam  Constitutional: She appears well-developed and well-nourished. No distress.       overwt and well appearing   HENT:  Head: Normocephalic and atraumatic.  Right Ear: External ear normal.  Left Ear: External ear normal.  Nose: Nose normal.  Mouth/Throat: Oropharynx is clear and moist.  Eyes: Conjunctivae and EOM are normal. Pupils are equal, round, and reactive to light.  Neck: Normal range of motion. Neck supple. No JVD present. Carotid bruit is not present. No thyromegaly present.  Cardiovascular: Normal rate, regular rhythm, normal heart sounds and intact distal pulses.   Pulmonary/Chest: Effort normal  and breath sounds normal. No respiratory distress. She has no wheezes. She exhibits no tenderness.  Abdominal: Soft. Bowel sounds are normal. She exhibits no distension, no abdominal bruit and no mass. There is no tenderness.  Genitourinary: No breast swelling, tenderness, discharge or bleeding.  Musculoskeletal: Normal range of motion. She exhibits no edema and no tenderness.       No kyphosis Some pain with int/ext rot of R hip  Lymphadenopathy:    She has no cervical adenopathy.  Neurological: She is alert. She has normal reflexes. No cranial nerve deficit. Coordination normal.  Skin: Skin is warm and dry. No rash noted. No erythema. No pallor.  Psychiatric: She has a normal mood and affect.          Assessment & Plan:

## 2011-04-29 NOTE — Assessment & Plan Note (Signed)
Reviewed health habits including diet and exercise and skin cancer prevention Also reviewed health mt list, fam hx and immunizations  Rev wellness lab in detail Flu shot today Consider zoster vaccine  bp borderline- will watch that

## 2011-04-29 NOTE — Patient Instructions (Addendum)
Flu shot today If you are interested in shingles vaccine in future - call your insurance company to see how coverage is and call us to schedule in a month or more  Start back on fosamax Take your calcium and vitamin D Keep exercising  Follow up for R hip x ray in 2 weeks - please schedule

## 2011-04-29 NOTE — Assessment & Plan Note (Signed)
This is controlled by diet with HDL in 50s and LDL 120s Rev lab with pt  Rev low sat fat diet

## 2011-04-29 NOTE — Assessment & Plan Note (Signed)
Given poss fragility fx - will go back on fosamax Px this  Rev ca and D tx D level Good exercise Disc safety

## 2011-04-30 ENCOUNTER — Encounter: Payer: Self-pay | Admitting: Family Medicine

## 2011-05-18 ENCOUNTER — Ambulatory Visit (INDEPENDENT_AMBULATORY_CARE_PROVIDER_SITE_OTHER)
Admission: RE | Admit: 2011-05-18 | Discharge: 2011-05-18 | Disposition: A | Discharge status: Home / Self Care | Payer: PRIVATE HEALTH INSURANCE | Source: Ambulatory Visit | Attending: Family Medicine | Admitting: Family Medicine

## 2011-05-18 DIAGNOSIS — M25551 Pain in right hip: Secondary | ICD-10-CM

## 2011-05-18 DIAGNOSIS — M25559 Pain in unspecified hip: Secondary | ICD-10-CM

## 2011-06-12 ENCOUNTER — Ambulatory Visit (AMBULATORY_SURGERY_CENTER): Payer: PRIVATE HEALTH INSURANCE | Admitting: *Deleted

## 2011-06-12 VITALS — Ht 62.0 in | Wt 168.0 lb

## 2011-06-12 DIAGNOSIS — Z1211 Encounter for screening for malignant neoplasm of colon: Secondary | ICD-10-CM

## 2011-06-12 MED ORDER — PEG-KCL-NACL-NASULF-NA ASC-C 100 G PO SOLR: ORAL | Status: DC

## 2011-06-22 ENCOUNTER — Encounter: Payer: Self-pay | Admitting: *Deleted

## 2011-06-22 ENCOUNTER — Ambulatory Visit (AMBULATORY_SURGERY_CENTER): Payer: PRIVATE HEALTH INSURANCE | Admitting: Gastroenterology

## 2011-06-22 ENCOUNTER — Encounter: Payer: Self-pay | Admitting: Gastroenterology

## 2011-06-22 VITALS — BP 137/94 | HR 83 | Temp 97.5°F | Resp 19 | Ht 62.0 in | Wt 168.0 lb

## 2011-06-22 DIAGNOSIS — Z1211 Encounter for screening for malignant neoplasm of colon: Secondary | ICD-10-CM

## 2011-06-22 MED ORDER — SODIUM CHLORIDE 0.9 % IV SOLN: 500.0000 mL | INTRAVENOUS | Status: DC

## 2011-06-22 NOTE — Progress Notes (Signed)
Patient did not experience any of the following events: a burn prior to discharge; a fall within the facility; wrong site/side/patient/procedure/implant event; or a hospital transfer or hospital admission upon discharge from the facility. (G8907) Patient did not have preoperative order for IV antibiotic SSI prophylaxis. (G8918)  

## 2011-06-22 NOTE — Patient Instructions (Signed)
NORMAL EXAM TODAY. REPEAT  COLONOSCOPY IN 10 YEARS. PLEASE FOLLOW DISCHARGE INSTRUCTIONS GIVEN TODAY. SEE HANDOUTS. RESUME YOUR CURRENT MEDICATIONS TODAY. CALL us WITH ANY QUESTIONS OR CONCERNS. THANK YOU!!

## 2011-06-22 NOTE — Progress Notes (Signed)
Dr. Christella Hartigan made aware of B/P. Hr=69 skin warm and dry.

## 2011-06-23 ENCOUNTER — Telehealth: Payer: Self-pay

## 2011-06-23 NOTE — Telephone Encounter (Signed)

## 2011-09-21 ENCOUNTER — Encounter: Payer: Self-pay | Admitting: Family Medicine

## 2011-09-21 ENCOUNTER — Ambulatory Visit (INDEPENDENT_AMBULATORY_CARE_PROVIDER_SITE_OTHER): Payer: PRIVATE HEALTH INSURANCE | Admitting: Family Medicine

## 2011-09-21 VITALS — BP 140/82 | HR 80 | Temp 98.2°F | Ht 62.0 in | Wt 167.5 lb

## 2011-09-21 DIAGNOSIS — M542 Cervicalgia: Secondary | ICD-10-CM

## 2011-09-21 DIAGNOSIS — E669 Obesity, unspecified: Secondary | ICD-10-CM | POA: Insufficient documentation

## 2011-09-21 DIAGNOSIS — IMO0001 Reserved for inherently not codable concepts without codable children: Secondary | ICD-10-CM

## 2011-09-21 DIAGNOSIS — R030 Elevated blood-pressure reading, without diagnosis of hypertension: Secondary | ICD-10-CM

## 2011-09-21 MED ORDER — CYCLOBENZAPRINE HCL 10 MG PO TABS: 10.0000 mg | ORAL_TABLET | Freq: Three times a day (TID) | ORAL | Status: AC | PRN

## 2011-09-21 NOTE — Assessment & Plan Note (Signed)
Muscle spasm from working above her head Disc use of moist heat/ stretches and cervical support pillow  Given px flexeril to use with care Avoid nsaids (inc bp) and avoid tylenol(allergic) Update if not starting to improve in a week or if worsening

## 2011-09-21 NOTE — Assessment & Plan Note (Signed)
Without HTN at this time Inaccurate cuff/ use of nsaids and checking at the wrong time See inst for new directions Update if above 140/80 Disc lifestyle change and wt loss

## 2011-09-21 NOTE — Assessment & Plan Note (Signed)
Discussed how this problem influences overall health and the risks it imposes  Reviewed plan for weight loss with lower calorie diet (via better food choices and also portion control or program like weight watchers) and exercise building up to or more than 30 minutes 5 days per week including some aerobic activity    

## 2011-09-21 NOTE — Patient Instructions (Addendum)
Get a new bp cuff OMRON for the arm size regular  Stop anti inflammatories Only check bp after you have been resting or relaxed for at least 1 hour  Try flexeril when needed for muscle soreness - watch out for sedation Try a cervical support pillow made of foam Use heat on neck or other are sore Do stretching/ massage/ yoga Update if not starting to improve in a week or if worsening

## 2011-09-21 NOTE — Progress Notes (Signed)
Subjective:    Patient ID: Brianna Espinoza, female    DOB: 1951/05/27, 61 y.o.   MRN: [id number]  HPI Here for labile bp and also pain in R neck  140/82 here today (157/122 by her cuff)  bp at home were good since retired - then some are up -- a few 154/93/ 131/82/117/77-- all over the place   Never had HTN   Chemistry      Component Value Date/Time   NA 141 03/13/2010 2257   K 4.6 03/13/2010 2257   CL 105 03/13/2010 2257   CO2 26 03/13/2010 2257   BUN 16 03/13/2010 2257   CREATININE 0.82 03/13/2010 2257      Component Value Date/Time   CALCIUM 9.7 03/13/2010 2257   ALKPHOS 70 03/13/2010 2257   AST 17 03/13/2010 2257   ALT 15 03/13/2010 2257   BILITOT 0.6 03/13/2010 2257     some stress- is in midst of remodel project  Is sore / and also allergies worse On naproxen  No headache or leg swelling No cp or sob   R neck pain  Thinks it is from working overhead Sharp/ dull pain at occiput- and radiates down  Still has nl rom -- but some crepitus/ crunching  Moist heat relieves pain  Gets very tight No numb or weak   Is aware she needs to work on wt loss  Patient Active Problem List  Diagnoses  . HYPERLIPIDEMIA  . ALLERGIC RHINITIS  . GERD  . OSTEOARTHRITIS  . OSTEOPENIA  . Routine general medical examination at a health care facility  . Right hip pain  . Special screening for malignant neoplasms, colon  . Elevated BP  . Neck pain on right side  . Obesity   Past Medical History  Diagnosis Date  . Allergy     allergic rhinitis  . GERD (gastroesophageal reflux disease)   . Hyperlipidemia   . Arthritis     OA  . Obesity   . Fibrocystic breast   . Osteopenia   . Tibia fracture 2012    playing golf    Past Surgical History  Procedure Date  . Tubal ligation 1976    lap  . Hernia repair     right  . Breast surgery 1980s    aspirated breast lump   History  Substance Use Topics  . Smoking status: Never Smoker   . Smokeless tobacco: Never Used  . Alcohol Use:  1.8 oz/week    3 Glasses of wine per week   Family History  Problem Relation Age of Onset  . Arthritis Mother   . Hyperlipidemia Mother   . Hypertension Mother   . Stroke Father   . Cancer Father     prostate CA  . Heart disease Father   . Pancreatic cancer Maternal Grandfather   . Colon cancer Neg Hx    Allergies  Allergen Reactions  . Acetaminophen     REACTION: reaction not known  . Tetanus Toxoid     REACTION: reaction not known   Current Outpatient Prescriptions on File Prior to Visit  Medication Sig Dispense Refill  . alendronate (FOSAMAX) 70 MG tablet Take 1 tablet (70 mg total) by mouth every 7 (seven) days. Take with a full glass of water on an empty stomach.  4 tablet  11  . Calcium Carbonate-Vit D-Min 600-400 MG-UNIT TABS Take 2 tablets by mouth daily.        . chlorpheniramine (CHLOR-TRIMETON) 4 MG tablet Take 4  mg by mouth 2 (two) times daily as needed.        . conjugated estrogens (PREMARIN) vaginal cream Use small amount (about 1-2 cm) in applicator vaginally twice a week  30 g  5  . Multiple Vitamin (MULTIVITAMIN) tablet Take 1 tablet by mouth daily.        . naproxen sodium (ANAPROX) 220 MG tablet Take 220 mg by mouth 2 (two) times daily as needed.        Marland Kitchen omeprazole (PRILOSEC) 20 MG capsule Take 1 capsule (20 mg total) by mouth daily.  30 capsule  11      Review of Systems Review of Systems  Constitutional: Negative for fever, appetite change, fatigue and unexpected weight change.  Eyes: Negative for pain and visual disturbance.  Respiratory: Negative for cough and shortness of breath.   Cardiovascular: Negative for cp or palpitations    Gastrointestinal: Negative for nausea, diarrhea and constipation.  Genitourinary: Negative for urgency and frequency.  Skin: Negative for pallor or rash   MSK pos for neck pain / neg for swollen or red joints  Neurological: Negative for weakness, light-headedness, numbness and headaches.  Hematological: Negative for  adenopathy. Does not bruise/bleed easily.  Psychiatric/Behavioral: Negative for dysphoric mood. The patient is not nervous/anxious.         Objective:   Physical Exam  Constitutional: She appears well-developed and well-nourished. No distress.       overwt and well appearing   HENT:  Head: Normocephalic and atraumatic.  Mouth/Throat: Oropharynx is clear and moist.  Eyes: Conjunctivae and EOM are normal. Pupils are equal, round, and reactive to light.  Neck: Normal range of motion. Neck supple. No JVD present. Carotid bruit is not present. No thyromegaly present.       Nl rom neck passive Some pain on full ext active  Tender in R para cervical musculature and trapezius  No crepitus No bony tenderness  Cardiovascular: Normal rate, regular rhythm, normal heart sounds and intact distal pulses.  Exam reveals no gallop.   Pulmonary/Chest: Effort normal. No respiratory distress. She has no wheezes.  Abdominal: Soft. Bowel sounds are normal. She exhibits no distension and no abdominal bruit. There is no tenderness.  Musculoskeletal: She exhibits tenderness. She exhibits no edema.  Lymphadenopathy:    She has no cervical adenopathy.  Neurological: She is alert. She has normal strength and normal reflexes. No cranial nerve deficit or sensory deficit. She exhibits normal muscle tone. Coordination normal.  Skin: Skin is warm and dry. No rash noted. No erythema. No pallor.  Psychiatric: She has a normal mood and affect.          Assessment & Plan:

## 2012-03-16 ENCOUNTER — Other Ambulatory Visit: Payer: Self-pay | Admitting: Family Medicine

## 2012-03-16 DIAGNOSIS — Z1231 Encounter for screening mammogram for malignant neoplasm of breast: Secondary | ICD-10-CM

## 2012-04-12 ENCOUNTER — Ambulatory Visit
Admission: RE | Admit: 2012-04-12 | Discharge: 2012-04-12 | Disposition: A | Discharge status: Home / Self Care | Payer: PRIVATE HEALTH INSURANCE | Source: Ambulatory Visit | Attending: Family Medicine | Admitting: Family Medicine

## 2012-04-12 DIAGNOSIS — Z1231 Encounter for screening mammogram for malignant neoplasm of breast: Secondary | ICD-10-CM

## 2012-04-13 ENCOUNTER — Other Ambulatory Visit: Payer: Self-pay | Admitting: Family Medicine

## 2012-04-13 DIAGNOSIS — R928 Other abnormal and inconclusive findings on diagnostic imaging of breast: Secondary | ICD-10-CM

## 2012-04-15 ENCOUNTER — Ambulatory Visit
Admission: RE | Admit: 2012-04-15 | Discharge: 2012-04-15 | Disposition: A | Discharge status: Home / Self Care | Payer: PRIVATE HEALTH INSURANCE | Source: Ambulatory Visit | Attending: Family Medicine | Admitting: Family Medicine

## 2012-04-15 DIAGNOSIS — R928 Other abnormal and inconclusive findings on diagnostic imaging of breast: Secondary | ICD-10-CM

## 2012-05-06 ENCOUNTER — Other Ambulatory Visit: Payer: Self-pay | Admitting: *Deleted

## 2012-05-06 NOTE — Telephone Encounter (Signed)
Ok to refill? Received fax refill request but med was not on med list when first accessed chart

## 2012-05-07 MED ORDER — ALENDRONATE SODIUM 70 MG PO TABS: 70.0000 mg | ORAL_TABLET | ORAL | Status: DC

## 2012-05-07 NOTE — Telephone Encounter (Signed)
Will refill electronically  

## 2012-05-10 ENCOUNTER — Other Ambulatory Visit: Payer: Self-pay | Admitting: *Deleted

## 2012-05-10 MED ORDER — OMEPRAZOLE 20 MG PO CPDR: 20.0000 mg | DELAYED_RELEASE_CAPSULE | Freq: Every day | ORAL | Status: DC

## 2012-07-20 ENCOUNTER — Encounter: Payer: Self-pay | Admitting: Family Medicine

## 2012-07-20 ENCOUNTER — Ambulatory Visit (INDEPENDENT_AMBULATORY_CARE_PROVIDER_SITE_OTHER): Payer: PRIVATE HEALTH INSURANCE | Admitting: Family Medicine

## 2012-07-20 ENCOUNTER — Other Ambulatory Visit (HOSPITAL_COMMUNITY)
Admission: RE | Admit: 2012-07-20 | Discharge: 2012-07-20 | Disposition: A | Discharge status: Home / Self Care | Payer: PRIVATE HEALTH INSURANCE | Source: Ambulatory Visit | Attending: Family Medicine | Admitting: Family Medicine

## 2012-07-20 VITALS — BP 154/88 | HR 83 | Temp 98.5°F | Ht 60.75 in | Wt 165.8 lb

## 2012-07-20 DIAGNOSIS — Z1151 Encounter for screening for human papillomavirus (HPV): Secondary | ICD-10-CM | POA: Insufficient documentation

## 2012-07-20 DIAGNOSIS — Z0000 Encounter for general adult medical examination without abnormal findings: Secondary | ICD-10-CM

## 2012-07-20 DIAGNOSIS — Z01419 Encounter for gynecological examination (general) (routine) without abnormal findings: Secondary | ICD-10-CM

## 2012-07-20 DIAGNOSIS — R030 Elevated blood-pressure reading, without diagnosis of hypertension: Secondary | ICD-10-CM

## 2012-07-20 DIAGNOSIS — M949 Disorder of cartilage, unspecified: Secondary | ICD-10-CM

## 2012-07-20 DIAGNOSIS — IMO0001 Reserved for inherently not codable concepts without codable children: Secondary | ICD-10-CM

## 2012-07-20 DIAGNOSIS — E785 Hyperlipidemia, unspecified: Secondary | ICD-10-CM

## 2012-07-20 DIAGNOSIS — E669 Obesity, unspecified: Secondary | ICD-10-CM

## 2012-07-20 DIAGNOSIS — M899 Disorder of bone, unspecified: Secondary | ICD-10-CM

## 2012-07-20 DIAGNOSIS — Z23 Encounter for immunization: Secondary | ICD-10-CM

## 2012-07-20 MED ORDER — ALENDRONATE SODIUM 70 MG PO TABS: 70.0000 mg | ORAL_TABLET | ORAL | Status: DC

## 2012-07-20 MED ORDER — FLUTICASONE PROPIONATE 50 MCG/ACT NA SUSP: 2.0000 | Freq: Every day | NASAL | Status: DC

## 2012-07-20 MED ORDER — OMEPRAZOLE 20 MG PO CPDR: 20.0000 mg | DELAYED_RELEASE_CAPSULE | Freq: Every day | ORAL | Status: DC

## 2012-07-20 NOTE — Progress Notes (Signed)
Subjective:    Patient ID: Brianna Espinoza, female    DOB: 09/25/1950, 61 y.o.   MRN: [id number]  HPI Here for health maintenance exam and to review chronic medical problems    Has been feeling good - having a really good year overall  Joined the gym in October - and is working with a trainer  This has been great - wants to keep doing that  Has lost some inches-happy with that    Wt is down 2 lb with bmi of 31 Is eating very healthy as well- making a good effort    Zoster status- her insurance does not pay for that  She thinks medicare will pay some after the age 63   Pap 8/10 Does not go to gyn  Will do 3 year exam today  mammo 9/13- went back for an ultrasound (she gets the 3 D mam)- was nothing -- has f/u planned in march  Self exam -no lumps or changes   Flu vaccine-did not get yet  Wants to get today   colonosc 11/12  Cannot have tetanus shot due to allergy   bp is up  Hx of inc bp without HTN  BP Readings from Last 3 Encounters:  07/20/12 154/88  09/21/11 140/82  06/22/11 137/94   at home - has been very good - usually 130s/70s reliably- OMRON cuff - following it closely   Hyperlipidemia-needs her labs - can get them from employee health Diet controlled Lab Results  Component Value Date   CHOL 189 03/13/2010   CHOL 202* 03/12/2009   CHOL 186 12/27/2007   Lab Results  Component Value Date   HDL 51 03/13/2010   HDL 49 11/09/8117   HDL 59 1/47/8295   Lab Results  Component Value Date   LDLCALC 125* 03/13/2010   LDLCALC 135* 03/12/2009   LDLCALC 116* 12/27/2007   Lab Results  Component Value Date   TRIG 63 03/13/2010   TRIG 88 03/12/2009   TRIG 56 12/27/2007   Lab Results  Component Value Date   CHOLHDL 3.7 Ratio 03/13/2010   CHOLHDL 4.1 Ratio 03/12/2009   CHOLHDL 3.2 Ratio 12/27/2007   No results found for this basename: LDLDIRECT    Osteopenia  On fosamax- just over a year and no problems  Hx of ankle fracture  dexa 8/11    Patient Active Problem  List  Diagnosis  . HYPERLIPIDEMIA  . ALLERGIC RHINITIS  . GERD  . OSTEOARTHRITIS  . OSTEOPENIA  . Routine general medical examination at a health care facility  . Right hip pain  . Special screening for malignant neoplasms, colon  . Elevated BP  . Neck pain on right side  . Obesity   Past Medical History  Diagnosis Date  . Allergy     allergic rhinitis  . GERD (gastroesophageal reflux disease)   . Hyperlipidemia   . Arthritis     OA  . Obesity   . Fibrocystic breast   . Osteopenia   . Tibia fracture 2012    playing golf    Past Surgical History  Procedure Date  . Tubal ligation 1976    lap  . Hernia repair     right  . Breast surgery 1980s    aspirated breast lump   History  Substance Use Topics  . Smoking status: Never Smoker   . Smokeless tobacco: Never Used  . Alcohol Use: 1.8 oz/week    3 Glasses of wine per week   Family History  Problem Relation Age of Onset  . Arthritis Mother   . Hyperlipidemia Mother   . Hypertension Mother   . Stroke Father   . Cancer Father     prostate CA  . Heart disease Father   . Pancreatic cancer Maternal Grandfather   . Colon cancer Neg Hx    Allergies  Allergen Reactions  . Acetaminophen     REACTION: reaction not known  . Tetanus Toxoid     REACTION: reaction not known  . Septra (Sulfamethoxazole W-Trimethoprim) Rash    Body rash   Current Outpatient Prescriptions on File Prior to Visit  Medication Sig Dispense Refill  . alendronate (FOSAMAX) 70 MG tablet Take 1 tablet (70 mg total) by mouth every 7 (seven) days. Take with a full glass of water on an empty stomach.  4 tablet  11  . Calcium Carbonate-Vit D-Min 600-400 MG-UNIT TABS Take 2 tablets by mouth daily.        . chlorpheniramine (CHLOR-TRIMETON) 4 MG tablet Take 4 mg by mouth 2 (two) times daily as needed.        . conjugated estrogens (PREMARIN) vaginal cream Use small amount (about 1-2 cm) in applicator vaginally twice a week  30 g  5  . fexofenadine  (ALLEGRA) 180 MG tablet Take 180 mg by mouth daily.      . fluticasone (FLONASE) 50 MCG/ACT nasal spray Place 2 sprays into the nose daily.      . Multiple Vitamin (MULTIVITAMIN) tablet Take 1 tablet by mouth daily.        Marland Kitchen omeprazole (PRILOSEC) 20 MG capsule Take 1 capsule (20 mg total) by mouth daily.  30 capsule  2       Review of Systems Review of Systems  Constitutional: Negative for fever, appetite change, fatigue and unexpected weight change.  Eyes: Negative for pain and visual disturbance.  Respiratory: Negative for cough and shortness of breath.   Cardiovascular: Negative for cp or palpitations    Gastrointestinal: Negative for nausea, diarrhea and constipation.  Genitourinary: Negative for urgency and frequency.  Skin: Negative for pallor or rash   Neurological: Negative for weakness, light-headedness, numbness and headaches.  Hematological: Negative for adenopathy. Does not bruise/bleed easily.  Psychiatric/Behavioral: Negative for dysphoric mood. The patient is not nervous/anxious.         Objective:   Physical Exam  Constitutional: She appears well-developed and well-nourished. No distress.       obese and well appearing   HENT:  Head: Normocephalic and atraumatic.  Right Ear: External ear normal.  Left Ear: External ear normal.  Nose: Nose normal.  Mouth/Throat: Oropharynx is clear and moist. No oropharyngeal exudate.  Eyes: Conjunctivae normal and EOM are normal. Pupils are equal, round, and reactive to light. Right eye exhibits no discharge. Left eye exhibits no discharge. No scleral icterus.  Neck: Normal range of motion. Neck supple. No JVD present. Carotid bruit is not present. No thyromegaly present.  Cardiovascular: Normal rate, regular rhythm, normal heart sounds and intact distal pulses.  Exam reveals no gallop.   Pulmonary/Chest: Effort normal and breath sounds normal. No respiratory distress. She has no wheezes.  Abdominal: Soft. Bowel sounds are normal.  She exhibits no distension, no abdominal bruit and no mass. There is no tenderness.  Genitourinary: Vagina normal and uterus normal. No breast swelling, tenderness, discharge or bleeding. There is no rash, tenderness or lesion on the right labia. There is no rash, tenderness or lesion on the left labia. Uterus is  not enlarged and not tender. Cervix exhibits no motion tenderness, no discharge and no friability. Right adnexum displays no mass, no tenderness and no fullness. Left adnexum displays no mass, no tenderness and no fullness. No bleeding around the vagina. No vaginal discharge found.       Breast exam: No mass, nodules, thickening, tenderness, bulging, retraction, inflamation, nipple discharge or skin changes noted.  No axillary or clavicular LA.  Chaperoned exam.    Musculoskeletal: Normal range of motion. She exhibits no edema and no tenderness.  Lymphadenopathy:    She has no cervical adenopathy.  Neurological: She is alert. She has normal reflexes. No cranial nerve deficit. She exhibits normal muscle tone. Coordination normal.  Skin: Skin is warm and dry. No rash noted. No erythema.  Psychiatric: She has a normal mood and affect.          Assessment & Plan:

## 2012-07-20 NOTE — Assessment & Plan Note (Signed)
Reviewed health habits including diet and exercise and skin cancer prevention Also reviewed health mt list, fam hx and immunizations   Lab order done for wellness labs  dexa scheduled Flu shot given

## 2012-07-20 NOTE — Assessment & Plan Note (Signed)
3 year exam and pap done No complaints or problems utd mammogram  Nl breast exam and enc to continue self exams

## 2012-07-20 NOTE — Assessment & Plan Note (Signed)
Order for lipid check through [facility] at her job done Rev low sat fat diet  Thrilled she is exercising and watching her diet

## 2012-07-20 NOTE — Assessment & Plan Note (Signed)
Discussed how this problem influences overall health and the risks it imposes  Reviewed plan for weight loss with lower calorie diet (via better food choices and also portion control or program like weight watchers) and exercise building up to or more than 30 minutes 5 days per week including some aerobic activity   Pt is making a good effort- commended on this !

## 2012-07-20 NOTE — Patient Instructions (Addendum)
Flu shot today Keep exercising! Keep watching blood pressure at home Here are your lab orders We will set up bone density test at check out

## 2012-07-20 NOTE — Assessment & Plan Note (Signed)
Scheduled dexa- due for 2 year re check  On fosamax Will check D level  Hx of one fx No falls this year

## 2012-07-20 NOTE — Assessment & Plan Note (Signed)
Some whitecoat component- watching carefully bp at home have been ok  Will continue to follow Enc further exercise and wt loss effort

## 2012-07-26 ENCOUNTER — Encounter: Payer: Self-pay | Admitting: *Deleted

## 2012-07-28 ENCOUNTER — Ambulatory Visit
Admission: RE | Admit: 2012-07-28 | Discharge: 2012-07-28 | Disposition: A | Discharge status: Home / Self Care | Payer: PRIVATE HEALTH INSURANCE | Source: Ambulatory Visit | Attending: Family Medicine | Admitting: Family Medicine

## 2012-07-28 DIAGNOSIS — M899 Disorder of bone, unspecified: Secondary | ICD-10-CM

## 2012-07-28 LAB — HM DEXA SCAN

## 2012-08-04 ENCOUNTER — Encounter: Payer: Self-pay | Admitting: Family Medicine

## 2012-08-10 ENCOUNTER — Encounter: Payer: Self-pay | Admitting: *Deleted

## 2012-08-19 ENCOUNTER — Encounter: Payer: Self-pay | Admitting: Family Medicine

## 2012-09-05 ENCOUNTER — Other Ambulatory Visit: Payer: Self-pay | Admitting: Family Medicine

## 2012-09-05 DIAGNOSIS — N6009 Solitary cyst of unspecified breast: Secondary | ICD-10-CM

## 2012-10-14 ENCOUNTER — Ambulatory Visit
Admission: RE | Admit: 2012-10-14 | Discharge: 2012-10-14 | Disposition: A | Discharge status: Home / Self Care | Payer: PRIVATE HEALTH INSURANCE | Source: Ambulatory Visit | Attending: Family Medicine | Admitting: Family Medicine

## 2012-10-14 DIAGNOSIS — N6009 Solitary cyst of unspecified breast: Secondary | ICD-10-CM

## 2012-10-16 ENCOUNTER — Encounter: Payer: Self-pay | Admitting: Family Medicine

## 2013-03-20 ENCOUNTER — Other Ambulatory Visit: Payer: Self-pay | Admitting: Family Medicine

## 2013-03-20 DIAGNOSIS — N63 Unspecified lump in unspecified breast: Secondary | ICD-10-CM

## 2013-04-27 ENCOUNTER — Ambulatory Visit
Admission: RE | Admit: 2013-04-27 | Discharge: 2013-04-27 | Disposition: A | Discharge status: Home / Self Care | Payer: PRIVATE HEALTH INSURANCE | Source: Ambulatory Visit | Attending: Family Medicine | Admitting: Family Medicine

## 2013-04-27 DIAGNOSIS — N63 Unspecified lump in unspecified breast: Secondary | ICD-10-CM

## 2013-04-28 ENCOUNTER — Other Ambulatory Visit: Payer: PRIVATE HEALTH INSURANCE

## 2013-06-06 ENCOUNTER — Telehealth: Payer: Self-pay | Admitting: Family Medicine

## 2013-06-06 NOTE — Telephone Encounter (Signed)
Pt has a CPE scheduled for 07/21/2013.  Her MCR is going to be effective 09/03/2013 so she wants to know should she cancel the 12/19 appointment and schedule a [facility] apptmt (which currently your 1st available is not until 11/01/2013) next year or should she come one to the CPE in December? Thank you.

## 2013-06-06 NOTE — Telephone Encounter (Signed)
I'm fine with waiting until spring if that is what she wants to do-thanks

## 2013-06-07 ENCOUNTER — Other Ambulatory Visit: Payer: Self-pay | Admitting: *Deleted

## 2013-06-07 MED ORDER — ALENDRONATE SODIUM 70 MG PO TABS: 70.0000 mg | ORAL_TABLET | ORAL | Status: DC

## 2013-06-08 ENCOUNTER — Other Ambulatory Visit: Payer: Self-pay

## 2013-06-08 NOTE — Telephone Encounter (Signed)
Pt scheduled for 11/13/2012

## 2013-06-24 ENCOUNTER — Encounter: Payer: Self-pay | Admitting: Family Medicine

## 2013-07-21 ENCOUNTER — Encounter: Payer: PRIVATE HEALTH INSURANCE | Admitting: Family Medicine

## 2013-11-07 ENCOUNTER — Telehealth: Payer: Self-pay | Admitting: Family Medicine

## 2013-11-07 DIAGNOSIS — Z0000 Encounter for general adult medical examination without abnormal findings: Secondary | ICD-10-CM | POA: Insufficient documentation

## 2013-11-07 DIAGNOSIS — M899 Disorder of bone, unspecified: Secondary | ICD-10-CM

## 2013-11-07 DIAGNOSIS — M949 Disorder of cartilage, unspecified: Principal | ICD-10-CM

## 2013-11-07 DIAGNOSIS — E785 Hyperlipidemia, unspecified: Secondary | ICD-10-CM

## 2013-11-07 NOTE — Telephone Encounter (Signed)
Message copied by Abner Greenspan on Tue Nov 07, 2013 12:07 PM ------      Message from: Ellamae Sia      Created: Thu Nov 02, 2013 10:49 AM      Regarding: Lab orders for Thursday, 4.9.15       Patient is scheduled for CPX labs, please order future labs, Thanks , Terri       ------

## 2013-11-09 ENCOUNTER — Other Ambulatory Visit (INDEPENDENT_AMBULATORY_CARE_PROVIDER_SITE_OTHER): Payer: Medicare Other

## 2013-11-09 DIAGNOSIS — Z0000 Encounter for general adult medical examination without abnormal findings: Secondary | ICD-10-CM

## 2013-11-09 DIAGNOSIS — IMO0001 Reserved for inherently not codable concepts without codable children: Secondary | ICD-10-CM

## 2013-11-09 DIAGNOSIS — M949 Disorder of cartilage, unspecified: Secondary | ICD-10-CM

## 2013-11-09 DIAGNOSIS — E785 Hyperlipidemia, unspecified: Secondary | ICD-10-CM

## 2013-11-09 DIAGNOSIS — M899 Disorder of bone, unspecified: Secondary | ICD-10-CM

## 2013-11-09 DIAGNOSIS — R030 Elevated blood-pressure reading, without diagnosis of hypertension: Secondary | ICD-10-CM

## 2013-11-09 LAB — CBC WITH DIFFERENTIAL/PLATELET
Basophils Absolute: 0.1 10*3/uL (ref 0.0–0.1)
Basophils Relative: 0.8 % (ref 0.0–3.0)
EOS PCT: 2.1 % (ref 0.0–5.0)
Eosinophils Absolute: 0.2 10*3/uL (ref 0.0–0.7)
HEMATOCRIT: 42.2 % (ref 36.0–46.0)
HEMOGLOBIN: 14.2 g/dL (ref 12.0–15.0)
LYMPHS ABS: 2.4 10*3/uL (ref 0.7–4.0)
LYMPHS PCT: 29.3 % (ref 12.0–46.0)
MCHC: 33.7 g/dL (ref 30.0–36.0)
MCV: 91.9 fl (ref 78.0–100.0)
MONOS PCT: 5.5 % (ref 3.0–12.0)
Monocytes Absolute: 0.4 10*3/uL (ref 0.1–1.0)
NEUTROS ABS: 5.1 10*3/uL (ref 1.4–7.7)
Neutrophils Relative %: 62.3 % (ref 43.0–77.0)
Platelets: 248 10*3/uL (ref 150.0–400.0)
RBC: 4.59 Mil/uL (ref 3.87–5.11)
RDW: 13.8 % (ref 11.5–14.6)
WBC: 8.1 10*3/uL (ref 4.5–10.5)

## 2013-11-09 LAB — COMPREHENSIVE METABOLIC PANEL
ALBUMIN: 4 g/dL (ref 3.5–5.2)
ALT: 16 U/L (ref 0–35)
AST: 20 U/L (ref 0–37)
Alkaline Phosphatase: 59 U/L (ref 39–117)
BUN: 15 mg/dL (ref 6–23)
CALCIUM: 9.4 mg/dL (ref 8.4–10.5)
CHLORIDE: 106 meq/L (ref 96–112)
CO2: 32 meq/L (ref 19–32)
CREATININE: 0.8 mg/dL (ref 0.4–1.2)
GFR: 81.14 mL/min (ref >60.00)
Glucose, Bld: 90 mg/dL (ref 70–99)
POTASSIUM: 4 meq/L (ref 3.5–5.1)
SODIUM: 141 meq/L (ref 135–145)
TOTAL PROTEIN: 7 g/dL (ref 6.0–8.3)
Total Bilirubin: 0.7 mg/dL (ref 0.3–1.2)

## 2013-11-09 LAB — LIPID PANEL
CHOL/HDL RATIO: 4
Cholesterol: 195 mg/dL (ref 0–200)
HDL: 51.2 mg/dL (ref >39.00)
LDL Cholesterol: 128 mg/dL — ABNORMAL HIGH (ref 0–99)
Triglycerides: 79 mg/dL (ref 0.0–149.0)
VLDL: 15.8 mg/dL (ref 0.0–40.0)

## 2013-11-09 LAB — TSH: TSH: 1.31 u[IU]/mL (ref 0.35–5.50)

## 2013-11-10 LAB — VITAMIN D 25 HYDROXY (VIT D DEFICIENCY, FRACTURES): Vit D, 25-Hydroxy: 53 ng/mL (ref 30–89)

## 2013-11-13 ENCOUNTER — Ambulatory Visit (INDEPENDENT_AMBULATORY_CARE_PROVIDER_SITE_OTHER): Payer: Medicare Other | Admitting: Family Medicine

## 2013-11-13 ENCOUNTER — Encounter: Payer: Self-pay | Admitting: Family Medicine

## 2013-11-13 VITALS — BP 106/68 | HR 88 | Temp 98.3°F | Ht 60.75 in | Wt 164.8 lb

## 2013-11-13 DIAGNOSIS — Z0000 Encounter for general adult medical examination without abnormal findings: Secondary | ICD-10-CM

## 2013-11-13 DIAGNOSIS — M899 Disorder of bone, unspecified: Secondary | ICD-10-CM

## 2013-11-13 DIAGNOSIS — J309 Allergic rhinitis, unspecified: Secondary | ICD-10-CM

## 2013-11-13 DIAGNOSIS — M949 Disorder of cartilage, unspecified: Secondary | ICD-10-CM

## 2013-11-13 DIAGNOSIS — E785 Hyperlipidemia, unspecified: Secondary | ICD-10-CM

## 2013-11-13 MED ORDER — ZOSTER VACCINE LIVE 19400 UNT/0.65ML ~~LOC~~ SOLR: 0.6500 mL | Freq: Once | SUBCUTANEOUS | Status: DC

## 2013-11-13 MED ORDER — OMEPRAZOLE 20 MG PO CPDR: 20.0000 mg | DELAYED_RELEASE_CAPSULE | Freq: Every day | ORAL | Status: DC

## 2013-11-13 MED ORDER — ALENDRONATE SODIUM 70 MG PO TABS: 70.0000 mg | ORAL_TABLET | ORAL | Status: DC

## 2013-11-13 MED ORDER — FLUTICASONE PROPIONATE 50 MCG/ACT NA SUSP: 2.0000 | Freq: Every day | NASAL | Status: DC

## 2013-11-13 NOTE — Assessment & Plan Note (Signed)
Disc goals for lipids and reasons to control them Rev labs with pt Rev low sat fat diet in detail   

## 2013-11-13 NOTE — Assessment & Plan Note (Signed)
Disc opt for tx otc She will switch from allegra to zyrtec Continue flonase ? Consider adding singulaire if no imp

## 2013-11-13 NOTE — Assessment & Plan Note (Addendum)
Reviewed health habits including diet and exercise and skin cancer prevention Reviewed appropriate screening tests for age  Also reviewed health mt list, fam hx and immunization status , as well as social and family history   Enc to keep up exercise Labs reviewed-will continue to work on cholesterol control  Given px for zostavax Will come back this summer for pneumovax

## 2013-11-13 NOTE — Patient Instructions (Signed)
Try zyrtec 10 mg daily instead of allegra After your trip - call to make a nurse appt for pneumonia vaccine  Here is a px for shingles vaccine to take to your pharmacy Keep taking good care of yourself

## 2013-11-13 NOTE — Progress Notes (Signed)
Subjective:    Patient ID: Brianna Espinoza, female    DOB: [birth year], 63 y.o.   MRN: [id number]  HPI I have personally reviewed the Medicare Annual Wellness questionnaire and have noted 1. The patient's medical and social history 2. Their use of alcohol, tobacco or illicit drugs 3. Their current medications and supplements 4. The patient's functional ability including ADL's, fall risks, home safety risks and hearing or visual             impairment. 5. Diet and physical activities 6. Evidence for depression or mood disorders  The patients weight, height, BMI have been recorded in the chart and visual acuity is per eye clinic.  I have made referrals, counseling and provided education to the patient based review of the above and I have provided the pt with a written personalized care plan for preventive services.  Is doing well overall  Suffering pollen allergies - seasonal  She takes allegra otc - may need to switch it  She also uses flonase    See scanned forms.  Routine anticipatory guidance given to patient.  See health maintenance. Colon cancer screening -colonosc 11/12 Breast cancer screening mammogram 9/14  Self breast exam-no lumps  Flu vaccine 10/14  Tetanus vaccine -cannot get due to allergy  Pneumovax - will get that after coming home from a trip Pap nl 12/13  Zoster vaccine- she wants to get that - needs written px for that -ins will cover at pharmacy  Advance directive-- has a living will  Cognitive function addressed- see scanned forms- and if abnormal then additional documentation follows. - no problems at all   PMH and SH reviewed  Meds, vitals, and allergies reviewed.   ROS: See HPI.  Otherwise negative.      hyperlipidemia Lab Results  Component Value Date   CHOL 195 11/09/2013   HDL 51.20 11/09/2013   LDLCALC 128* 11/09/2013   TRIG 79.0 11/09/2013   CHOLHDL 4 11/09/2013  diet is overall pretty good Stays away from fats  Also sweets and bread Goes to the  gym 3 times per week - lifts weights and does cardio on machines - feels really good  zumba was too hard on ankles     Osteopenia  On fosamax  dexa 12/13 D level 53 No fractures   Patient Active Problem List   Diagnosis Date Noted  . Encounter for Medicare annual wellness exam 11/07/2013  . Routine gynecological examination 07/20/2012  . Elevated BP 09/21/2011  . Neck pain on right side 09/21/2011  . Obesity 09/21/2011  . Routine general medical examination at a health care facility 04/29/2011  . Right hip pain 04/29/2011  . Special screening for malignant neoplasms, colon 04/29/2011  . OSTEOPENIA 03/12/2009  . HYPERLIPIDEMIA 12/13/2006  . ALLERGIC RHINITIS 12/13/2006  . GERD 12/13/2006  . OSTEOARTHRITIS 12/13/2006   Past Medical History  Diagnosis Date  . Allergy     allergic rhinitis  . GERD (gastroesophageal reflux disease)   . Hyperlipidemia   . Arthritis     OA  . Obesity   . Fibrocystic breast   . Osteopenia   . Tibia fracture 2012    playing golf    Past Surgical History  Procedure Laterality Date  . Tubal ligation  1976    lap  . Hernia repair      right  . Breast surgery  1980s    aspirated breast lump   History  Substance Use Topics  . Smoking status: Never  Smoker   . Smokeless tobacco: Never Used  . Alcohol Use: 1.8 oz/week    3 Glasses of wine per week     Comment: occ   Family History  Problem Relation Age of Onset  . Arthritis Mother   . Hyperlipidemia Mother   . Hypertension Mother   . Stroke Father   . Cancer Father     prostate CA  . Heart disease Father   . Pancreatic cancer Maternal Grandfather   . Colon cancer Neg Hx    Allergies  Allergen Reactions  . Acetaminophen     REACTION: reaction not known  . Tetanus Toxoid     REACTION: reaction not known  . Septra [Sulfamethoxazole-Trimethoprim] Rash    Body rash   Current Outpatient Prescriptions on File Prior to Visit  Medication Sig Dispense Refill  . alendronate  (FOSAMAX) 70 MG tablet Take 1 tablet (70 mg total) by mouth every 7 (seven) days. Take with a full glass of water on an empty stomach.  4 tablet  5  . Calcium Carbonate-Vit D-Min 600-400 MG-UNIT TABS Take 2 tablets by mouth daily.        . fexofenadine (ALLEGRA) 180 MG tablet Take 180 mg by mouth daily.      . fluticasone (FLONASE) 50 MCG/ACT nasal spray Place 2 sprays into the nose daily.  16 g  11  . Multiple Vitamin (MULTIVITAMIN) tablet Take 1 tablet by mouth daily.        Marland Kitchen omeprazole (PRILOSEC) 20 MG capsule Take 1 capsule (20 mg total) by mouth daily.  30 capsule  11   No current facility-administered medications on file prior to visit.    Review of Systems    Review of Systems  Constitutional: Negative for fever, appetite change, fatigue and unexpected weight change.  ENT pos for sneezing and runny nose with pollen outdoors  Eyes: Negative for pain and visual disturbance.  Respiratory: Negative for cough and shortness of breath.   Cardiovascular: Negative for cp or palpitations    Gastrointestinal: Negative for nausea, diarrhea and constipation.  Genitourinary: Negative for urgency and frequency.  Skin: Negative for pallor or rash   Neurological: Negative for weakness, light-headedness, numbness and headaches.  Hematological: Negative for adenopathy. Does not bruise/bleed easily.  Psychiatric/Behavioral: Negative for dysphoric mood. The patient is not nervous/anxious.      Objective:   Physical Exam  Constitutional: She appears well-developed and well-nourished. No distress.  obese and well appearing   HENT:  Head: Normocephalic and atraumatic.  Right Ear: External ear normal.  Left Ear: External ear normal.  Mouth/Throat: Oropharynx is clear and moist.  Eyes: Conjunctivae and EOM are normal. Pupils are equal, round, and reactive to light. No scleral icterus.  Neck: Normal range of motion. Neck supple. No JVD present. Carotid bruit is not present. No thyromegaly present.    Cardiovascular: Normal rate, regular rhythm, normal heart sounds and intact distal pulses.  Exam reveals no gallop.   Pulmonary/Chest: Effort normal and breath sounds normal. No respiratory distress. She has no wheezes. She exhibits no tenderness.  Abdominal: Soft. Bowel sounds are normal. She exhibits no distension, no abdominal bruit and no mass. There is no tenderness.  Genitourinary: No breast swelling, tenderness, discharge or bleeding.  Breast exam: No mass, nodules, thickening, tenderness, bulging, retraction, inflamation, nipple discharge or skin changes noted.  No axillary or clavicular LA.      Musculoskeletal: Normal range of motion. She exhibits no edema and no tenderness.  Lymphadenopathy:  She has no cervical adenopathy.  Neurological: She is alert. She has normal reflexes. No cranial nerve deficit. She exhibits normal muscle tone. Coordination normal.  Skin: Skin is warm and dry. No rash noted. No erythema. No pallor.  Solar lentigos diffusely Scar from recently removed nevus on mid back  Psychiatric: She has a normal mood and affect.          Assessment & Plan:

## 2013-11-13 NOTE — Assessment & Plan Note (Signed)
utd dexa No fx Continue exercise Disc need for calcium/ vitamin D/ wt bearing exercise and bone density test every 2 y to monitor Disc safety/ fracture risk in detail

## 2013-11-13 NOTE — Progress Notes (Signed)
Pre visit review using our clinic review tool, if applicable. No additional management support is needed unless otherwise documented below in the visit note. 

## 2014-02-22 ENCOUNTER — Encounter: Payer: Self-pay | Admitting: Family Medicine

## 2014-04-05 ENCOUNTER — Other Ambulatory Visit: Payer: Self-pay

## 2014-04-05 DIAGNOSIS — Z1231 Encounter for screening mammogram for malignant neoplasm of breast: Secondary | ICD-10-CM

## 2014-04-30 ENCOUNTER — Ambulatory Visit
Admission: RE | Admit: 2014-04-30 | Discharge: 2014-04-30 | Disposition: A | Discharge status: Home / Self Care | Payer: Medicare Other | Source: Ambulatory Visit

## 2014-04-30 DIAGNOSIS — Z1231 Encounter for screening mammogram for malignant neoplasm of breast: Secondary | ICD-10-CM

## 2014-05-07 ENCOUNTER — Encounter: Payer: Self-pay | Admitting: Family Medicine

## 2014-05-07 ENCOUNTER — Ambulatory Visit (INDEPENDENT_AMBULATORY_CARE_PROVIDER_SITE_OTHER): Payer: Medicare Other | Admitting: Family Medicine

## 2014-05-07 VITALS — BP 146/70 | HR 87 | Temp 98.8°F | Ht 60.75 in | Wt 167.0 lb

## 2014-05-07 DIAGNOSIS — R109 Unspecified abdominal pain: Secondary | ICD-10-CM

## 2014-05-07 DIAGNOSIS — R1011 Right upper quadrant pain: Secondary | ICD-10-CM

## 2014-05-07 LAB — POCT URINALYSIS DIPSTICK
BILIRUBIN UA: NEGATIVE
GLUCOSE UA: NEGATIVE
Ketones, UA: NEGATIVE
NITRITE UA: NEGATIVE
Protein, UA: NEGATIVE
SPEC GRAV UA: 1.02
Urobilinogen, UA: 0.2
pH, UA: 6

## 2014-05-07 NOTE — Patient Instructions (Signed)
Stop at check out to schedule an abdominal ultrasound  Try to eat a diet as low fat as possible  If symptoms worsen please let me know

## 2014-05-07 NOTE — Assessment & Plan Note (Signed)
Suspect gallbladder source - worse with some foods  inst to avoid all fatty foods  Schedule abd [place] and update  inst to call or seek care if symptoms suddenly worsen or change

## 2014-05-07 NOTE — Progress Notes (Signed)
Subjective:    Patient ID: Brianna Espinoza, female    DOB: 12-Feb-1951, 63 y.o.   MRN: [id number]  HPI Here for R side pain that comes and goes for about 6 weeks  Worse with some foods -- coffee/ice cream/ peanut butter  She points to R upper abd  Sharp "sticking" -not severe but aggrivating  occ relieved by passing gas or BM Not positional or exercise related  Heat seems to help it  No nausea  Has occasional loose stool    Takes fosamax  Takes PPI   Never had gallbladder out   Had virus 6 wk ago with diarrhea -that got better (had nausea but no vomiting)   Patient Active Problem List   Diagnosis Date Noted  . Encounter for Medicare annual wellness exam 11/07/2013  . Routine gynecological examination 07/20/2012  . Obesity 09/21/2011  . Special screening for malignant neoplasms, colon 04/29/2011  . OSTEOPENIA 03/12/2009  . HYPERLIPIDEMIA 12/13/2006  . ALLERGIC RHINITIS 12/13/2006  . GERD 12/13/2006  . OSTEOARTHRITIS 12/13/2006   Past Medical History  Diagnosis Date  . Allergy     allergic rhinitis  . GERD (gastroesophageal reflux disease)   . Hyperlipidemia   . Arthritis     OA  . Obesity   . Fibrocystic breast   . Osteopenia   . Tibia fracture 2012    playing golf    Past Surgical History  Procedure Laterality Date  . Tubal ligation  1976    lap  . Hernia repair      right  . Breast surgery  1980s    aspirated breast lump   History  Substance Use Topics  . Smoking status: Never Smoker   . Smokeless tobacco: Never Used  . Alcohol Use: 1.8 oz/week    3 Glasses of wine per week     Comment: occ   Family History  Problem Relation Age of Onset  . Arthritis Mother   . Hyperlipidemia Mother   . Hypertension Mother   . Stroke Father   . Cancer Father     prostate CA  . Heart disease Father   . Pancreatic cancer Maternal Grandfather   . Colon cancer Neg Hx    Allergies  Allergen Reactions  . Acetaminophen     REACTION: reaction not known  .  Tetanus Toxoid     REACTION: reaction not known  . Septra [Sulfamethoxazole-Trimethoprim] Rash    Body rash   Current Outpatient Prescriptions on File Prior to Visit  Medication Sig Dispense Refill  . alendronate (FOSAMAX) 70 MG tablet Take 1 tablet (70 mg total) by mouth every 7 (seven) days. Take with a full glass of water on an empty stomach.  12 tablet  3  . Calcium Carbonate-Vit D-Min 600-400 MG-UNIT TABS Take 2 tablets by mouth daily.        . fluticasone (FLONASE) 50 MCG/ACT nasal spray Place 2 sprays into both nostrils daily.  48 g  3  . ibuprofen (ADVIL,MOTRIN) 200 MG tablet Take 400 mg by mouth as needed.      . Multiple Vitamin (MULTIVITAMIN) tablet Take 1 tablet by mouth daily.        . Multiple Vitamins-Minerals (HAIR/SKIN/NAILS PO) Take 2 capsules by mouth daily.      Marland Kitchen omeprazole (PRILOSEC) 20 MG capsule Take 1 capsule (20 mg total) by mouth daily.  90 capsule  3   No current facility-administered medications on file prior to visit.      Review  of Systems Review of Systems  Constitutional: Negative for fever, appetite change, fatigue and unexpected weight change.  Eyes: Negative for pain and visual disturbance.  Respiratory: Negative for cough and shortness of breath.   Cardiovascular: Negative for cp or palpitations    Gastrointestinal: Negative for nausea, and constipation. neg for blood in stool or dark stool or heartburn or trouble swallowing  Genitourinary: Negative for urgency and frequency.  Skin: Negative for pallor or rash   Neurological: Negative for weakness, light-headedness, numbness and headaches.  Hematological: Negative for adenopathy. Does not bruise/bleed easily.  Psychiatric/Behavioral: Negative for dysphoric mood. The patient is not nervous/anxious.         Objective:   Physical Exam  Constitutional: She appears well-developed and well-nourished. No distress.  obese and well appearing   HENT:  Head: Normocephalic and atraumatic.    Mouth/Throat: Oropharynx is clear and moist.  Eyes: Conjunctivae and EOM are normal. Pupils are equal, round, and reactive to light. No scleral icterus.  Neck: Normal range of motion. Neck supple. No JVD present.  Cardiovascular: Normal rate, regular rhythm, normal heart sounds and intact distal pulses.  Exam reveals no gallop.   Pulmonary/Chest: Effort normal and breath sounds normal. No respiratory distress. She has no wheezes. She has no rales.  Abdominal: Soft. Bowel sounds are normal. She exhibits no distension, no pulsatile liver, no abdominal bruit, no ascites, no pulsatile midline mass and no mass. There is no hepatosplenomegaly. There is tenderness in the right upper quadrant. There is no rigidity, no rebound, no guarding, no CVA tenderness, no tenderness at McBurney's point and negative Murphy's sign.  Musculoskeletal: She exhibits no edema.  Lymphadenopathy:    She has no cervical adenopathy.  Neurological: She is alert.  Skin: Skin is warm and dry. No rash noted. No erythema. No pallor.  No jaundice   Psychiatric: She has a normal mood and affect.          Assessment & Plan:   Problem List Items Addressed This Visit     Other   Abdominal pain, right upper quadrant - Primary     Suspect gallbladder source - worse with some foods  inst to avoid all fatty foods  Schedule abd [place] and update  inst to call or seek care if symptoms suddenly worsen or change     Relevant Orders      US Abdomen Complete    Other Visit Diagnoses   Flank pain        Relevant Orders       POCT urinalysis dipstick (Completed)

## 2014-05-07 NOTE — Progress Notes (Signed)
Pre visit review using our clinic review tool, if applicable. No additional management support is needed unless otherwise documented below in the visit note. 

## 2014-05-14 ENCOUNTER — Ambulatory Visit
Admission: RE | Admit: 2014-05-14 | Discharge: 2014-05-14 | Disposition: A | Discharge status: Home / Self Care | Payer: Medicare Other | Source: Ambulatory Visit | Attending: Family Medicine | Admitting: Family Medicine

## 2014-05-14 DIAGNOSIS — R1011 Right upper quadrant pain: Secondary | ICD-10-CM

## 2014-05-24 ENCOUNTER — Encounter: Payer: Self-pay | Admitting: Family Medicine

## 2014-05-30 ENCOUNTER — Ambulatory Visit (INDEPENDENT_AMBULATORY_CARE_PROVIDER_SITE_OTHER): Payer: Medicare Other

## 2014-05-30 DIAGNOSIS — Z23 Encounter for immunization: Secondary | ICD-10-CM

## 2014-07-19 ENCOUNTER — Telehealth: Payer: Self-pay | Admitting: Family Medicine

## 2014-07-19 ENCOUNTER — Encounter: Payer: Self-pay | Admitting: Family Medicine

## 2014-07-19 DIAGNOSIS — R1011 Right upper quadrant pain: Secondary | ICD-10-CM

## 2014-07-19 NOTE — Telephone Encounter (Signed)
Future labs for abd pain Pt will call to make lab appt

## 2014-07-20 ENCOUNTER — Other Ambulatory Visit (INDEPENDENT_AMBULATORY_CARE_PROVIDER_SITE_OTHER): Payer: Medicare Other

## 2014-07-20 DIAGNOSIS — R1011 Right upper quadrant pain: Secondary | ICD-10-CM

## 2014-07-20 LAB — CBC WITH DIFFERENTIAL/PLATELET
BASOS PCT: 0.5 % (ref 0.0–3.0)
Basophils Absolute: 0.1 10*3/uL (ref 0.0–0.1)
EOS ABS: 0.2 10*3/uL (ref 0.0–0.7)
EOS PCT: 1.7 % (ref 0.0–5.0)
HCT: 41.6 % (ref 36.0–46.0)
HEMOGLOBIN: 13.5 g/dL (ref 12.0–15.0)
LYMPHS PCT: 19 % (ref 12.0–46.0)
Lymphs Abs: 2.1 10*3/uL (ref 0.7–4.0)
MCHC: 32.4 g/dL (ref 30.0–36.0)
MCV: 91.6 fl (ref 78.0–100.0)
Monocytes Absolute: 0.7 10*3/uL (ref 0.1–1.0)
Monocytes Relative: 5.8 % (ref 3.0–12.0)
NEUTROS ABS: 8.2 10*3/uL — AB (ref 1.4–7.7)
NEUTROS PCT: 73 % (ref 43.0–77.0)
Platelets: 256 10*3/uL (ref 150.0–400.0)
RBC: 4.54 Mil/uL (ref 3.87–5.11)
RDW: 14 % (ref 11.5–15.5)
WBC: 11.3 10*3/uL — AB (ref 4.0–10.5)

## 2014-07-20 LAB — HEPATIC FUNCTION PANEL
ALBUMIN: 3.9 g/dL (ref 3.5–5.2)
ALK PHOS: 69 U/L (ref 39–117)
ALT: 18 U/L (ref 0–35)
AST: 21 U/L (ref 0–37)
BILIRUBIN DIRECT: 0.1 mg/dL (ref 0.0–0.3)
TOTAL PROTEIN: 6.7 g/dL (ref 6.0–8.3)
Total Bilirubin: 0.5 mg/dL (ref 0.2–1.2)

## 2014-07-20 LAB — BASIC METABOLIC PANEL
BUN: 18 mg/dL (ref 6–23)
CALCIUM: 9.4 mg/dL (ref 8.4–10.5)
CHLORIDE: 103 meq/L (ref 96–112)
CO2: 28 mEq/L (ref 19–32)
CREATININE: 0.8 mg/dL (ref 0.4–1.2)
GFR: 76.32 mL/min (ref >60.00)
Glucose, Bld: 89 mg/dL (ref 70–99)
Potassium: 4.4 mEq/L (ref 3.5–5.1)
Sodium: 137 mEq/L (ref 135–145)

## 2014-07-20 LAB — LIPASE: LIPASE: 39 U/L (ref 11.0–59.0)

## 2014-07-20 LAB — AMYLASE: AMYLASE: 79 U/L (ref 27–131)

## 2014-07-22 ENCOUNTER — Encounter: Payer: Self-pay | Admitting: Family Medicine

## 2014-07-23 ENCOUNTER — Telehealth: Payer: Self-pay | Admitting: Family Medicine

## 2014-07-23 DIAGNOSIS — R1011 Right upper quadrant pain: Secondary | ICD-10-CM

## 2014-07-23 NOTE — Telephone Encounter (Signed)
Ref to GI for abd pain

## 2014-09-14 ENCOUNTER — Encounter: Payer: Self-pay | Admitting: Gastroenterology

## 2014-09-14 ENCOUNTER — Ambulatory Visit (INDEPENDENT_AMBULATORY_CARE_PROVIDER_SITE_OTHER): Payer: Medicare Other | Admitting: Gastroenterology

## 2014-09-14 VITALS — BP 140/80 | HR 80 | Ht 60.5 in | Wt 164.2 lb

## 2014-09-14 DIAGNOSIS — R1011 Right upper quadrant pain: Secondary | ICD-10-CM

## 2014-09-14 NOTE — Progress Notes (Signed)
Review of pertinent gastrointestinal problems: 1. Routine risk for colon cancer: Colonoscopy, Dr. Ardis Hughs, 06/2011. Done for routine cancer screening. The examination was normal. She was recommended to have repeat colon cancer screening with colonoscopy at 10 year interval.   HPI: This is a   very pleasant 64 year old woman whom I last saw the time of a screening colonoscopy about 3-1/2 years ago. See those results summarized above.  She is here today to discuss a different problem.   For at least a year she has had sight sided abdominal pains.  Had acute pain, diarrhea many months ago (july 2015).  Then fine until 04/2014.  STarted to have intermittent pains, usually after eating certain foods. Any meat except chicken, worst is beef.  Very shortly after eating she will have RUQ pains that radiates around to right back.   Will last 30-60 min.  Heating pads usually helps.  Pains will occur 3-4 times per week.  Has really limited her diet.  heatingpads help.  Walking helps. Passing gas either above or below will help.  Overall weight down 5 pounds  To gym 3-4 times per week.  Takes no NSAIDs.   Labs December 2015; CBC, complete metabolic profile, amylase and lipase were all essentially normal Ultrasound October 2015; gallbladder was normal without stones, the liver appeared normal, common bile duct was normal. She was found to have some kidney cysts.    Review of systems: Pertinent positive and negative review of systems were noted in the above HPI section. Complete review of systems was performed and was otherwise normal.    Past Medical History  Diagnosis Date  . Allergy     allergic rhinitis  . GERD (gastroesophageal reflux disease)   . Hyperlipidemia   . Arthritis     OA  . Obesity   . Fibrocystic breast   . Osteopenia   . Tibia fracture 2012    playing golf     Past Surgical History  Procedure Laterality Date  . Tubal ligation  1976    lap  . Inguinal hernia  repair Right   . Breast cyst aspiration  1980s    aspirated breast lump    Current Outpatient Prescriptions  Medication Sig Dispense Refill  . alendronate (FOSAMAX) 70 MG tablet Take 1 tablet (70 mg total) by mouth every 7 (seven) days. Take with a full glass of water on an empty stomach. 12 tablet 3  . Calcium Carbonate-Vit D-Min 600-400 MG-UNIT TABS Take 2 tablets by mouth daily.      . fluticasone (FLONASE) 50 MCG/ACT nasal spray Place 2 sprays into both nostrils daily. (Patient taking differently: Place 2 sprays into both nostrils as needed. ) 48 g 3  . ibuprofen (ADVIL,MOTRIN) 200 MG tablet Take 400 mg by mouth as needed.    . loratadine (CLARITIN) 10 MG tablet Take 10 mg by mouth daily as needed for allergies.    . Multiple Vitamin (MULTIVITAMIN) tablet Take 1 tablet by mouth daily.      . Multiple Vitamins-Minerals (HAIR/SKIN/NAILS PO) Take 2 capsules by mouth daily.    Marland Kitchen omeprazole (PRILOSEC) 20 MG capsule Take 1 capsule (20 mg total) by mouth daily. 90 capsule 3   No current facility-administered medications for this visit.    Allergies as of 09/14/2014 - Review Complete 09/14/2014  Allergen Reaction Noted  . Acetaminophen  12/13/2006  . Tetanus toxoid  12/13/2006  . Septra [sulfamethoxazole-trimethoprim] Rash 07/20/2012    Family History  Problem Relation Age of Onset  .  Arthritis Mother   . Hyperlipidemia Mother   . Hypertension Mother   . Stroke Father   . Heart disease Father   . Pancreatic cancer Maternal Grandfather   . Colon cancer Neg Hx   . COPD Mother   . Asthma Mother   . Arthritis Maternal Grandmother   . Hypertension Maternal Grandmother     History   Social History  . Marital Status: Married    Spouse Name: N/A  . Number of Children: 1  . Years of Education: N/A   Occupational History  . retired    Social History Main Topics  . Smoking status: Never Smoker   . Smokeless tobacco: Never Used  . Alcohol Use: 1.8 oz/week    3 Glasses of wine  per week     Comment: occ-wine  . Drug Use: No  . Sexual Activity: Not on file   Other Topics Concern  . Not on file   Social History Narrative       Physical Exam: BP 140/80 mmHg  Pulse 80  Ht 5' 0.5" (1.537 m)  Wt 164 lb 4 oz (74.503 kg)  BMI 31.54 kg/m2 Constitutional: generally well-appearing Psychiatric: alert and oriented x3 Eyes: extraocular movements intact Mouth: oral pharynx moist, no lesions Neck: supple no lymphadenopathy Cardiovascular: heart regular rate and rhythm Lungs: clear to auscultation bilaterally Abdomen: soft, nontender, nondistended, no obvious ascites, no peritoneal signs, normal bowel sounds Extremities: no lower extremity edema bilaterally Skin: no lesions on visible extremities    Assessment and plan: 64 y.o. female with  intermittent right-sided abdominal pains  She has no gallstones in her gallbladder on ultrasound and her pains are not classic biliary pains in that they are often relieved with heating pad and passing gas. I would like to proceed with EGD first to rule out gastritis, peptic ulcer disease, H. pylori infection. If that test is unrevealing then I will arrange HIDA scan to estimate gallbladder ejection fraction, check for biliary dyskinesia.

## 2014-09-14 NOTE — Patient Instructions (Signed)
You will be set up for an upper endoscopy for intermittent abd pains, epigastric. If that is unhelpful, then you will need further GB testing.

## 2014-09-18 ENCOUNTER — Ambulatory Visit (AMBULATORY_SURGERY_CENTER): Payer: Medicare Other | Admitting: Gastroenterology

## 2014-09-18 ENCOUNTER — Encounter: Payer: Self-pay | Admitting: Gastroenterology

## 2014-09-18 VITALS — BP 123/90 | HR 65 | Temp 97.5°F | Resp 18 | Ht 60.5 in | Wt 164.0 lb

## 2014-09-18 DIAGNOSIS — K2990 Gastroduodenitis, unspecified, without bleeding: Secondary | ICD-10-CM

## 2014-09-18 DIAGNOSIS — K2970 Gastritis, unspecified, without bleeding: Secondary | ICD-10-CM

## 2014-09-18 DIAGNOSIS — R1011 Right upper quadrant pain: Secondary | ICD-10-CM

## 2014-09-18 DIAGNOSIS — K2950 Unspecified chronic gastritis without bleeding: Secondary | ICD-10-CM

## 2014-09-18 HISTORY — PX: ESOPHAGOGASTRODUODENOSCOPY ENDOSCOPY: SHX5814

## 2014-09-18 MED ORDER — SODIUM CHLORIDE 0.9 % IV SOLN: 500.0000 mL | INTRAVENOUS | Status: DC

## 2014-09-18 NOTE — Progress Notes (Signed)
Patient awakening,vss,report to rn 

## 2014-09-18 NOTE — Op Note (Signed)
[name]  [address]. [place], [id number]   ENDOSCOPY PROCEDURE REPORT  PATIENT: Brianna Espinoza, Brianna Espinoza  MR#: [id number] BIRTHDATE: 12/27/1950 , 63  yrs. old GENDER: female ENDOSCOPIST: Milus Banister, MD PROCEDURE DATE:  09/18/2014 PROCEDURE:  EGD w/ biopsy ASA CLASS:     Class II INDICATIONS:  Intermittent post prandial RUQ abd pain, [place] was normal. MEDICATIONS: Monitored anesthesia care and Propofol 150 mg IV TOPICAL ANESTHETIC: Cetacaine Spray  DESCRIPTION OF PROCEDURE: After the risks benefits and alternatives of the procedure were thoroughly explained, informed consent was obtained.  The [id number] endoscope was introduced through the mouth and advanced to the second portion of the duodenum , Without limitations.  The instrument was slowly withdrawn as the mucosa was fully examined.   There was mild, non-specific distal gastritis.  This was biopsied and sent to pathology.  The examination was otherwise normal. Retroflexed views revealed no abnormalities.     The scope was then withdrawn from the patient and the procedure completed.  COMPLICATIONS: There were no immediate complications.  ENDOSCOPIC IMPRESSION: There was mild, non-specific distal gastritis.  This was biopsied and sent to pathology.  The examination was otherwise normal  RECOMMENDATIONS: If biopsies show H.  pylori, you will be treated with appropriate antibiotics.  If negative for H.  pylori, will arrange HIDA scan to estimate gallbladder function.   eSigned:  Milus Banister, MD 09/18/2014 10:23 AM    CC: Loura Pardon, MD

## 2014-09-18 NOTE — Progress Notes (Signed)
Called to room to assist during endoscopic procedure.  Patient ID and intended procedure confirmed with present staff. Received instructions for my participation in the procedure from the performing physician.  

## 2014-09-18 NOTE — Patient Instructions (Signed)
YOU HAD AN ENDOSCOPIC PROCEDURE TODAY AT THE [facility]: Refer to the procedure report that was given to you for any specific questions about what was found during the examination.  If the procedure report does not answer your questions, please call your gastroenterologist to clarify.  If you requested that your care partner not be given the details of your procedure findings, then the procedure report has been included in a sealed envelope for you to review at your convenience later.  YOU SHOULD EXPECT: Some feelings of bloating in the abdomen. Passage of more gas than usual.  Walking can help get rid of the air that was put into your GI tract during the procedure and reduce the bloating. If you had a lower endoscopy (such as a colonoscopy or flexible sigmoidoscopy) you may notice spotting of blood in your stool or on the toilet paper. If you underwent a bowel prep for your procedure, then you may not have a normal bowel movement for a few days.  DIET: Your first meal following the procedure should be a light meal and then it is ok to progress to your normal diet.  A half-sandwich or bowl of soup is an example of a good first meal.  Heavy or fried foods are harder to digest and may make you feel nauseous or bloated.  Likewise meals heavy in dairy and vegetables can cause extra gas to form and this can also increase the bloating.  Drink plenty of fluids but you should avoid alcoholic beverages for 24 hours.  ACTIVITY: Your care partner should take you home directly after the procedure.  You should plan to take it easy, moving slowly for the rest of the day.  You can resume normal activity the day after the procedure however you should NOT DRIVE or use heavy machinery for 24 hours (because of the sedation medicines used during the test).    SYMPTOMS TO REPORT IMMEDIATELY: A gastroenterologist can be reached at any hour.  During normal business hours, 8:30 AM to 5:00 PM Monday through Friday,  call [phone].  After hours and on weekends, please call the GI answering service at [phone] who will take a message and have the physician on call contact you.    Following upper endoscopy (EGD)  Vomiting of blood or coffee ground material  New chest pain or pain under the shoulder blades  Painful or persistently difficult swallowing  New shortness of breath  Fever of 100F or higher  Black, tarry-looking stools  FOLLOW UP: If any biopsies were taken you will be contacted by phone or by letter within the next 1-3 weeks.  Call your gastroenterologist if you have not heard about the biopsies in 3 weeks.  Our staff will call the home number listed on your records the next business day following your procedure to check on you and address any questions or concerns that you may have at that time regarding the information given to you following your procedure. This is a courtesy call and so if there is no answer at the home number and we have not heard from you through the emergency physician on call, we will assume that you have returned to your regular daily activities without incident.  SIGNATURES/CONFIDENTIALITY: You and/or your care partner have signed paperwork which will be entered into your electronic medical record.  These signatures attest to the fact that that the information above on your After Visit Summary has been reviewed and is understood.  Full   responsibility of the confidentiality of this discharge information lies with you and/or your care-partner.  Recommendations Discharge instructions given to patient and/or care partner. Gastritis handout provided.

## 2014-09-19 ENCOUNTER — Telehealth: Payer: Self-pay | Admitting: *Deleted

## 2014-09-19 NOTE — Telephone Encounter (Signed)
  Follow up Call-  Call back number 09/18/2014  Post procedure Call Back phone  # [phone] cell  Permission to leave phone message Yes     Patient questions:  Do you have a fever, pain , or abdominal swelling? No. Pain Score  0 *  Have you tolerated food without any problems? Yes.    Have you been able to return to your normal activities? Yes.    Do you have any questions about your discharge instructions: Diet   No Medications  No. Follow up visit  No.  Do you have questions or concerns about your Care? No.  Actions: * If pain score is 4 or above: No action needed, pain <4.

## 2014-09-26 ENCOUNTER — Other Ambulatory Visit: Payer: Self-pay

## 2014-09-26 ENCOUNTER — Telehealth: Payer: Self-pay | Admitting: Gastroenterology

## 2014-09-26 ENCOUNTER — Inpatient Hospital Stay: Payer: Self-pay | Admitting: Internal Medicine

## 2014-09-26 DIAGNOSIS — R1011 Right upper quadrant pain: Secondary | ICD-10-CM

## 2014-09-26 NOTE — Telephone Encounter (Signed)
See result note.  

## 2014-10-03 ENCOUNTER — Encounter: Payer: Self-pay | Admitting: Family Medicine

## 2014-10-03 ENCOUNTER — Ambulatory Visit (INDEPENDENT_AMBULATORY_CARE_PROVIDER_SITE_OTHER): Payer: Medicare Other | Admitting: Family Medicine

## 2014-10-03 VITALS — BP 154/82 | HR 75 | Temp 98.6°F | Ht 61.0 in | Wt 161.1 lb

## 2014-10-03 DIAGNOSIS — I639 Cerebral infarction, unspecified: Secondary | ICD-10-CM

## 2014-10-03 DIAGNOSIS — Z8673 Personal history of transient ischemic attack (TIA), and cerebral infarction without residual deficits: Secondary | ICD-10-CM | POA: Insufficient documentation

## 2014-10-03 DIAGNOSIS — I10 Essential (primary) hypertension: Secondary | ICD-10-CM

## 2014-10-03 DIAGNOSIS — E785 Hyperlipidemia, unspecified: Secondary | ICD-10-CM

## 2014-10-03 MED ORDER — ATORVASTATIN CALCIUM 20 MG PO TABS: 20.0000 mg | ORAL_TABLET | Freq: Every day | ORAL | Status: DC

## 2014-10-03 MED ORDER — HYDROCHLOROTHIAZIDE 25 MG PO TABS: 25.0000 mg | ORAL_TABLET | Freq: Every day | ORAL | Status: DC

## 2014-10-03 NOTE — Progress Notes (Signed)
Pre visit review using our clinic review tool, if applicable. No additional management support is needed unless otherwise documented below in the visit note. 

## 2014-10-03 NOTE — Assessment & Plan Note (Signed)
Mild Recent CVA with improved symptoms Start hctz 25 mg daily  F/u 2-3 wk Disc poss side eff Disc DASH diet-handout given

## 2014-10-03 NOTE — Progress Notes (Signed)
Subjective:    Patient ID: Brianna Espinoza, female    DOB: 19-Jan-1951, 64 y.o.   MRN: [id number]  HPI hosp 2/24-2/25 at [facility] for acute R thalamic infarct with L face/arm/leg symptoms   Symptoms improved beginning that evening - starting with her leg  By am arm improved Now her hand is better - a little tingling in thumb and index finger  Face is almost back to normal --waiting for lips to stop tingling   Put on asa full strength - for 7 days and then 81 mg after that daily  Also atorvastatin - tolerates well without any side effects  Cholesterol HDL 48 , LDL 110    Was not a candidate for thromolytics   2D echo - that was normal  No dysrhythmia  CT ok MRI 1 cm lat R thalamic infarct  Carotid dopplers - minimal stenosis   No source found   They had PT eval her - and she had no problems with function overall Is doing some home exercises with her hand  Also writing more   bp here BP Readings from Last 3 Encounters:  10/03/14 154/82  09/18/14 123/90  09/14/14 140/80   bp at home ranges from one teens /70s to 140s /90s Varies stays very very active  Not on any bp medicines   Patient Active Problem List   Diagnosis Date Noted  . CVA (cerebral infarction) 10/03/2014  . Essential hypertension 10/03/2014  . Abdominal pain, right upper quadrant 05/07/2014  . Obesity 09/21/2011  . OSTEOPENIA 03/12/2009  . Hyperlipidemia 12/13/2006  . ALLERGIC RHINITIS 12/13/2006  . GERD 12/13/2006  . OSTEOARTHRITIS 12/13/2006   Past Medical History  Diagnosis Date  . Allergy     allergic rhinitis  . GERD (gastroesophageal reflux disease)   . Hyperlipidemia   . Arthritis     OA  . Obesity   . Fibrocystic breast   . Osteopenia   . Tibia fracture 2012    playing golf    Past Surgical History  Procedure Laterality Date  . Tubal ligation  1976    lap  . Inguinal hernia repair Right   . Breast cyst aspiration  1980s    aspirated breast lump   History  Substance Use Topics  .  Smoking status: Never Smoker   . Smokeless tobacco: Never Used  . Alcohol Use: 1.8 oz/week    3 Glasses of wine per week     Comment: occ-wine   Family History  Problem Relation Age of Onset  . Arthritis Mother   . Hyperlipidemia Mother   . Hypertension Mother   . COPD Mother   . Asthma Mother   . Stroke Father   . Heart disease Father   . Pancreatic cancer Maternal Grandfather   . Colon cancer Neg Hx   . Esophageal cancer Neg Hx   . Rectal cancer Neg Hx   . Stomach cancer Neg Hx   . Arthritis Maternal Grandmother   . Hypertension Maternal Grandmother    Allergies  Allergen Reactions  . Tetanus Toxoid Hives  . Acetaminophen Rash  . Septra [Sulfamethoxazole-Trimethoprim] Rash    Body rash   Current Outpatient Prescriptions on File Prior to Visit  Medication Sig Dispense Refill  . alendronate (FOSAMAX) 70 MG tablet Take 1 tablet (70 mg total) by mouth every 7 (seven) days. Take with a full glass of water on an empty stomach. 12 tablet 3  . Calcium Carbonate-Vit D-Min 600-400 MG-UNIT TABS Take 2 tablets  by mouth daily.      . fluticasone (FLONASE) 50 MCG/ACT nasal spray Place 2 sprays into both nostrils daily. (Patient taking differently: Place 2 sprays into both nostrils as needed. ) 48 g 3  . ibuprofen (ADVIL,MOTRIN) 200 MG tablet Take 400 mg by mouth as needed.    . loratadine (CLARITIN) 10 MG tablet Take 10 mg by mouth daily as needed for allergies.    . Multiple Vitamin (MULTIVITAMIN) tablet Take 1 tablet by mouth daily.      . Multiple Vitamins-Minerals (HAIR/SKIN/NAILS PO) Take 2 capsules by mouth daily.    Marland Kitchen omeprazole (PRILOSEC) 20 MG capsule Take 1 capsule (20 mg total) by mouth daily. 90 capsule 3   No current facility-administered medications on file prior to visit.     Review of Systems Review of Systems  Constitutional: Negative for fever, appetite change, fatigue and unexpected weight change.  Eyes: Negative for pain and visual disturbance.  Respiratory:  Negative for cough and shortness of breath.   Cardiovascular: Negative for cp or palpitations    Gastrointestinal: Negative for nausea, diarrhea and constipation.  Genitourinary: Negative for urgency and frequency.  Skin: Negative for pallor or rash   Neurological: Negative for weakness, light-headedness, numbness and headaches. pos for "funny feeling" in L hand that is improved  Hematological: Negative for adenopathy. Does not bruise/bleed easily.  Psychiatric/Behavioral: Negative for dysphoric mood. The patient is not nervous/anxious.         Objective:   Physical Exam  Constitutional: She appears well-developed and well-nourished. No distress.  overwt and well appearing   HENT:  Head: Normocephalic and atraumatic.  Mouth/Throat: Oropharynx is clear and moist.  Eyes: Conjunctivae and EOM are normal. Pupils are equal, round, and reactive to light. Right eye exhibits no discharge. Left eye exhibits no discharge. No scleral icterus.  Neck: Normal range of motion. Neck supple. No JVD present. Carotid bruit is not present. Erythema present. No thyromegaly present.  Cardiovascular: Normal rate, regular rhythm, normal heart sounds and intact distal pulses.  Exam reveals no gallop.   Pulmonary/Chest: Effort normal and breath sounds normal. No respiratory distress. She has no wheezes. She has no rales.  Abdominal: Soft. Bowel sounds are normal. She exhibits no distension and no mass. There is no tenderness.  Musculoskeletal: She exhibits no edema or tenderness.  Lymphadenopathy:    She has no cervical adenopathy.  Neurological: She is alert. She has normal reflexes. She displays no atrophy and no tremor. No cranial nerve deficit or sensory deficit. She exhibits normal muscle tone. She displays a negative Romberg sign. Coordination and gait normal.  Skin: Skin is warm and dry. No rash noted. No erythema. No pallor.  Psychiatric: She has a normal mood and affect.          Assessment & Plan:    Problem List Items Addressed This Visit      Cardiovascular and Mediastinum   Essential hypertension    Mild Recent CVA with improved symptoms Start hctz 25 mg daily  F/u 2-3 wk Disc poss side eff Disc DASH diet-handout given       Relevant Medications   aspirin 325 MG EC tablet   hydrochlorothiazide tablet   atorvastatin (LIPITOR) tablet     Nervous and Auditory   CVA (cerebral infarction) - Primary    Rev hosp records/labs and studies in detail with pt today  No embolic source found  Will refer to neurology Continue the aspirin at 81 mg daily Aggressive cholesterol control  with statin  Imp bp control with hctz F/u planned  Disc s/s of cva-pt understands to call 911 if re occurrence       Relevant Orders   Ambulatory referral to Neurology     Other   Hyperlipidemia    Tolerating lipitor Disc goal of aggressive control for hx of cva  Plan labs at next visit Rev low sat fat diet       Relevant Medications   aspirin 325 MG EC tablet   hydrochlorothiazide tablet   atorvastatin (LIPITOR) tablet

## 2014-10-03 NOTE — Patient Instructions (Signed)
Start hctz 25 mg once daily in am for blood pressure  Continue other medicines  Follow up with me for appt in 2-3 weeks  (fast for that) -will also do labs that day Keep watching bp - if low (90/50) -stop med and let me know   If any stroke symptoms call 911  Stop at check out for ref to neurology

## 2014-10-04 NOTE — Assessment & Plan Note (Signed)
Rev hosp records/labs and studies in detail with pt today  No embolic source found  Will refer to neurology Continue the aspirin at 81 mg daily Aggressive cholesterol control with statin  Imp bp control with hctz F/u planned  Disc s/s of cva-pt understands to call 911 if re occurrence

## 2014-10-04 NOTE — Assessment & Plan Note (Signed)
Tolerating lipitor Disc goal of aggressive control for hx of cva  Plan labs at next visit Rev low sat fat diet

## 2014-10-05 ENCOUNTER — Telehealth: Payer: Self-pay | Admitting: Family Medicine

## 2014-10-05 NOTE — Telephone Encounter (Signed)
emmi emailed °

## 2014-10-09 ENCOUNTER — Telehealth: Payer: Self-pay | Admitting: Gastroenterology

## 2014-10-09 ENCOUNTER — Ambulatory Visit (HOSPITAL_COMMUNITY): Payer: Medicare Other

## 2014-10-09 NOTE — Telephone Encounter (Signed)
FYI Dr Nicholaus Corolla

## 2014-10-09 NOTE — Telephone Encounter (Signed)
Pt aware and will call when she is ready

## 2014-10-09 NOTE — Telephone Encounter (Signed)
Sorry to hear that, please have her call back if/when she is ready to focus on GI symptoms, workup again.

## 2014-10-09 NOTE — Telephone Encounter (Signed)
Error wrong chart

## 2014-10-11 ENCOUNTER — Encounter: Payer: Self-pay | Admitting: Neurology

## 2014-10-11 ENCOUNTER — Ambulatory Visit (INDEPENDENT_AMBULATORY_CARE_PROVIDER_SITE_OTHER): Payer: Medicare Other | Admitting: Neurology

## 2014-10-11 VITALS — BP 135/86 | HR 83 | Ht 62.5 in | Wt 161.4 lb

## 2014-10-11 DIAGNOSIS — E785 Hyperlipidemia, unspecified: Secondary | ICD-10-CM | POA: Diagnosis not present

## 2014-10-11 DIAGNOSIS — I10 Essential (primary) hypertension: Secondary | ICD-10-CM | POA: Diagnosis not present

## 2014-10-11 DIAGNOSIS — I63311 Cerebral infarction due to thrombosis of right middle cerebral artery: Secondary | ICD-10-CM

## 2014-10-11 MED ORDER — CLOPIDOGREL BISULFATE 75 MG PO TABS: 75.0000 mg | ORAL_TABLET | Freq: Every day | ORAL | Status: DC

## 2014-10-11 NOTE — Patient Instructions (Signed)
-   change ASA to plavix for stroke prevention. - continue lipitor for stroke prevention - check BP at home and record and bring over the Dr. Glori Bickers - Follow up with your primary care physician for stroke risk factor modification. Recommend maintain blood pressure goal <130/80, diabetes with hemoglobin A1c goal below 6.5% and lipids with LDL cholesterol goal below 70 mg/dL.  - will do MRA to evaluate brain vessel.  - follow up in 2 months

## 2014-10-11 NOTE — Progress Notes (Addendum)
NEUROLOGY CLINIC NEW PATIENT NOTE  NAME: Brianna Espinoza DOB: 06/30/1951 REFERRING PHYSICIAN: Tower, Wynelle Fanny, MD  I saw Baltazar Najjar as a new consult in the neurovascular clinic today regarding  Chief Complaint  Patient presents with  . New Evaluation    RM 1  . Cerebrovascular Accident  .  HPI: Brianna Espinoza is a 64 y.o. female with PMH of osteoporosis who presents as a new patient for a stroke.   Patient stated that about 2 weeks ago she had a sudden onset left face, left arm and left leg numbness and tingling. She denies any weakness, vision changes, slurry speech. She called EMS, and was sent to [facility] where she had a CT head showed no acute changes however MRI showed right thalamus lacunar stroke. Her stroke workup there including carotid Doppler and 2-D echo were unremarkable. Her LDL 110. She was discharged with aspirin and Lipitor. She follow-up with Dr. Glori Bickers and was put on HCTZ for blood pressure control. She was referred here for further evaluation. For the last 2 weeks, patient stated that her symptoms gradually getting better, so far she only had intermittent left leg and left fingertip numbness tingling whenever she is tired.  She denies any history of hypertension, diabetes or hyperlipidemia. She denies any history of smoking or illicit drug use. She occasionally drink a glass of wine. Family history including mom had COPD and died of age 75. Father had heart attack at 23 and stroke at 56. Grandma had history of stroke.  Past Medical History  Diagnosis Date  . Allergy     allergic rhinitis  . GERD (gastroesophageal reflux disease)   . Hyperlipidemia   . Arthritis     OA  . Obesity   . Fibrocystic breast   . Osteopenia   . Tibia fracture 2012    playing golf    Past Surgical History  Procedure Laterality Date  . Tubal ligation  1976    lap  . Inguinal hernia repair Right 1956  . Breast cyst aspiration  1980s    aspirated breast lump    . Esophagogastroduodenoscopy endoscopy  09/18/14  . Colonoscopy  2012   Family History  Problem Relation Age of Onset  . Arthritis Mother   . Hyperlipidemia Mother   . Hypertension Mother   . COPD Mother   . Asthma Mother   . Stroke Father   . Heart disease Father   . Pancreatic cancer Maternal Grandfather   . Colon cancer Neg Hx   . Esophageal cancer Neg Hx   . Rectal cancer Neg Hx   . Stomach cancer Neg Hx   . Arthritis Maternal Grandmother   . Hypertension Maternal Grandmother   . Heart disease Maternal Grandfather   . Stroke Maternal Grandmother    Current Outpatient Prescriptions  Medication Sig Dispense Refill  . alendronate (FOSAMAX) 70 MG tablet Take 1 tablet (70 mg total) by mouth every 7 (seven) days. Take with a full glass of water on an empty stomach. 12 tablet 3  . atorvastatin (LIPITOR) 20 MG tablet Take 1 tablet (20 mg total) by mouth daily. 90 tablet 3  . Biotin 5 MG CAPS Take 5 mg by mouth daily.    . Calcium Carbonate-Vit D-Min 600-400 MG-UNIT TABS Take 2 tablets by mouth daily.      . fexofenadine (ALLEGRA) 180 MG tablet Take 180 mg by mouth daily.    . fluticasone (FLONASE) 50 MCG/ACT nasal spray Place 2 sprays  into both nostrils daily. (Patient taking differently: Place 2 sprays into both nostrils as needed. ) 48 g 3  . hydrochlorothiazide (HYDRODIURIL) 25 MG tablet Take 1 tablet (25 mg total) by mouth daily. 30 tablet 3  . ibuprofen (ADVIL,MOTRIN) 200 MG tablet Take 400 mg by mouth as needed.    . loratadine (CLARITIN) 10 MG tablet Take 10 mg by mouth daily as needed for allergies.    . Multiple Vitamin (MULTIVITAMIN) tablet Take 1 tablet by mouth daily.      . Multiple Vitamins-Minerals (HAIR/SKIN/NAILS PO) Take 2 capsules by mouth daily.    Marland Kitchen omeprazole (PRILOSEC) 20 MG capsule Take 1 capsule (20 mg total) by mouth daily. 90 capsule 3  . clopidogrel (PLAVIX) 75 MG tablet Take 1 tablet (75 mg total) by mouth daily. 90 tablet 3   No current  facility-administered medications for this visit.   Allergies  Allergen Reactions  . Tetanus Toxoid Hives  . Acetaminophen Rash  . Septra [Sulfamethoxazole-Trimethoprim] Rash    Body rash   History   Social History  . Marital Status: Married    Spouse Name: N/A  . Number of Children: 1  . Years of Education: 14   Occupational History  . retired    Social History Main Topics  . Smoking status: Never Smoker   . Smokeless tobacco: Never Used  . Alcohol Use: 1.8 oz/week    3 Glasses of wine per week     Comment: occ-wine  . Drug Use: No  . Sexual Activity: Not on file   Other Topics Concern  . Not on file   Social History Narrative   Patient is married with one child.   Patient is left handed.   Patient has 14 yrs of education.   Patient drinks 1 and 1/2 cup daily.    Review of Systems Full 14 system review of systems performed and notable only for those listed, all others are neg:  Constitutional:   Cardiovascular:  Ear/Nose/Throat:   Skin:  Eyes:   Respiratory:   Gastroitestinal:   Genitourinary:  Hematology/Lymphatic:   Endocrine:  Musculoskeletal:   Allergy/Immunology:  Allergy Neurological:  Numbness, tingling Psychiatric:  Sleep: Snoring   Physical Exam  Filed Vitals:   10/11/14 1131  BP: 135/86  Pulse: 83    General - Well nourished, well developed, in no apparent distress.  Ophthalmologic - Sharp disc margins OU.  Cardiovascular - Regular rate and rhythm with no murmur. Carotid pulses were 2+ without bruits.   Neck - supple, no nuchal rigidity .  Mental Status -  Level of arousal and orientation to time, place, and person were intact. Language including expression, naming, repetition, comprehension, reading, and writing was assessed and found intact. Attention span and concentration were normal. Recent and remote memory were intact. Fund of Knowledge was assessed and was intact.  Cranial Nerves II - XII - II - Visual field intact  OU. III, IV, VI - Extraocular movements intact. V - Facial sensation intact bilaterally. VII - Facial movement intact bilaterally. VIII - Hearing & vestibular intact bilaterally X - Palate elevates symmetrically. XI - Chin turning & shoulder shrug intact bilaterally. XII - Tongue protrusion intact.  Motor Strength - The patient's strength was normal in all extremities and pronator drift was absent.  Bulk was normal and fasciculations were absent.   Motor Tone - Muscle tone was assessed at the neck and appendages and was normal.  Reflexes - The patient's reflexes were normal in all  extremities and she had no pathological reflexes.  Sensory - Light touch, temperature/pinprick, vibration and proprioception, and Romberg testing were assessed and were normal.    Coordination - The patient had normal movements in the hands and feet with no ataxia or dysmetria.  Tremor was absent.  Gait and Station - The patient's transfers, posture, gait, station, and turns were observed as normal.   Imaging MRI head 09/26/2014 1 cm acute ischemic infarct involving the lateral right thalamus. No associated hemorrhage or significant mass effect.  CT head 09/26/14 Normal for age noncontrast CT appearance of the brain  Carotid Doppler  minimal amount of bilateral intimal wall thickening, left subjectively greater than right, not resulting in the hemodynamically significant stenosis.  2-D echo - Left ventricular ejection fraction is 70-75% - Normal global left ventricular systolic function - Mild mitral valve regurgitation - Mild tricuspid regurgitation  Lab Review Creatinine 0.66, AST 44, ALT 27 cholesterol 171, triglycerides 66, HDL 48, LDL 110  Assessments: NIHSS: 0 Rankin: 1   Assessment and Plan:   In summary, Brianna Espinoza is a 64 y.o. female with PMH of osteoporosis presents as a new patient for recent stroke. She had a right thalamic lacunar infarct about 2 weeks ago resulting intermittent  right leg and fingertip numbness tingling. Her stroke risk factor including age, hyperlipidemia, possible hypertension. Carotid Doppler and a 2-D echo unremarkable. Will need to check MRA and A1c to finish off stroke workup. She will have blood draw in 2 weeks with Dr. Glori Bickers. She is on aspirin and Lipitor now, will switch aspirin to Plavix.  - Switch aspirin to Plavix - Continue Lipitor for stroke prevention - Will do MRA of the head - Check blood pressure at home and record and bring over to PCP - Follow up with your primary care physician for stroke risk factor modification. Recommend maintain blood pressure goal <130/80, diabetes with hemoglobin A1c goal below 6.5% and lipids with LDL cholesterol goal below 70 mg/dL.  - RTC in 2 months  I recommend aggressive blood pressure control with a goal <130/80 mm Hg.  Lipids should be managed intensively, with a goal LDL < 70 mg/dL.  I encouraged the patient to discuss these important issues with her primary care physician.  I counseled the patient on measures to reduce stroke risk, including the importance of medication compliance, risk factor control, exercise, healthy diet, and avoidance of smoking.  I reviewed stroke warning signs and symptoms and appropriate actions to take if such occurs.   Thank you very much for the opportunity to participate in the care of this patient.  Please do not hesitate to call if any questions or concerns arise.  Orders Placed This Encounter  Procedures  . MR MRA HEAD WO CONTRAST    Standing Status: Future     Number of Occurrences:      Standing Expiration Date: 12/13/2015    Order Specific Question:  Reason for Exam (SYMPTOM  OR DIAGNOSIS REQUIRED)    Answer:  stroke    Order Specific Question:  Preferred imaging location?    Answer:  Internal    Order Specific Question:  Does the patient have a pacemaker or implanted devices?    Answer:  No    Order Specific Question:  What is the patient's sedation requirement?     Answer:  No Sedation    Meds ordered this encounter  Medications  . DISCONTD: aspirin 81 MG tablet    Sig: Take 81 mg by mouth  daily.  . fexofenadine (ALLEGRA) 180 MG tablet    Sig: Take 180 mg by mouth daily.  . Biotin 5 MG CAPS    Sig: Take 5 mg by mouth daily.  . clopidogrel (PLAVIX) 75 MG tablet    Sig: Take 1 tablet (75 mg total) by mouth daily.    Dispense:  90 tablet    Refill:  3    Patient Instructions  - change ASA to plavix for stroke prevention. - continue lipitor for stroke prevention - check BP at home and record and bring over the Dr. Glori Bickers - Follow up with your primary care physician for stroke risk factor modification. Recommend maintain blood pressure goal <130/80, diabetes with hemoglobin A1c goal below 6.5% and lipids with LDL cholesterol goal below 70 mg/dL.  - will do MRA to evaluate brain vessel.  - follow up in 2 months   Rosalin Hawking, MD PhD [facility] [address], [place], [place] [address] [phone]

## 2014-10-21 ENCOUNTER — Ambulatory Visit
Admission: RE | Admit: 2014-10-21 | Discharge: 2014-10-21 | Disposition: A | Discharge status: Home / Self Care | Payer: Medicare Other | Source: Ambulatory Visit | Attending: Neurology | Admitting: Neurology

## 2014-10-21 DIAGNOSIS — I63311 Cerebral infarction due to thrombosis of right middle cerebral artery: Secondary | ICD-10-CM

## 2014-10-23 ENCOUNTER — Encounter: Payer: Self-pay | Admitting: Family Medicine

## 2014-10-23 ENCOUNTER — Telehealth: Payer: Self-pay

## 2014-10-23 ENCOUNTER — Ambulatory Visit (INDEPENDENT_AMBULATORY_CARE_PROVIDER_SITE_OTHER): Payer: Medicare Other | Admitting: Family Medicine

## 2014-10-23 VITALS — BP 120/80 | HR 73 | Temp 98.3°F | Ht 60.75 in | Wt 159.4 lb

## 2014-10-23 DIAGNOSIS — I63311 Cerebral infarction due to thrombosis of right middle cerebral artery: Secondary | ICD-10-CM | POA: Diagnosis not present

## 2014-10-23 DIAGNOSIS — I10 Essential (primary) hypertension: Secondary | ICD-10-CM

## 2014-10-23 DIAGNOSIS — E785 Hyperlipidemia, unspecified: Secondary | ICD-10-CM | POA: Diagnosis not present

## 2014-10-23 LAB — BASIC METABOLIC PANEL
BUN: 19 mg/dL (ref 6–23)
CALCIUM: 10 mg/dL (ref 8.4–10.5)
CO2: 33 mEq/L — ABNORMAL HIGH (ref 19–32)
Chloride: 99 mEq/L (ref 96–112)
Creatinine, Ser: 0.94 mg/dL (ref 0.40–1.20)
GFR: 63.31 mL/min (ref >60.00)
Glucose, Bld: 97 mg/dL (ref 70–99)
POTASSIUM: 3.9 meq/L (ref 3.5–5.1)
Sodium: 137 mEq/L (ref 135–145)

## 2014-10-23 LAB — LIPID PANEL
CHOLESTEROL: 140 mg/dL (ref 0–200)
HDL: 43.5 mg/dL (ref >39.00)
LDL Cholesterol: 82 mg/dL (ref 0–99)
NonHDL: 96.5
TRIGLYCERIDES: 71 mg/dL (ref 0.0–149.0)
Total CHOL/HDL Ratio: 3
VLDL: 14.2 mg/dL (ref 0.0–40.0)

## 2014-10-23 LAB — HEMOGLOBIN A1C: HEMOGLOBIN A1C: 6 % (ref 4.6–6.5)

## 2014-10-23 NOTE — Progress Notes (Signed)
Pre visit review using our clinic review tool, if applicable. No additional management support is needed unless otherwise documented below in the visit note. 

## 2014-10-23 NOTE — Patient Instructions (Signed)
Lab today  Cholesterol and A1C and chemistries Keep up the good work with lifestyle change

## 2014-10-23 NOTE — Assessment & Plan Note (Signed)
In cva pt  Improved after addn of hctz and at goal  Bmp today Rev DASH eating plan  Doing well with lifestyle change

## 2014-10-23 NOTE — Telephone Encounter (Signed)
Called patient to give MRA results. Patient was very grateful and verbalized understanding.

## 2014-10-23 NOTE — Assessment & Plan Note (Signed)
In statin  Goal of LDL under 70 in stroke pt Tolerates lipitor 20  Rev low sat fat diet  Lab today

## 2014-10-23 NOTE — Progress Notes (Signed)
Subjective:    Patient ID: Brianna Espinoza, female    DOB: 22-Jul-1951, 64 y.o.   MRN: [id number]  HPI Here for f/u of HTN s/p CVA  Added hctz 25 mg  bp is much better   BP Readings from Last 3 Encounters:  10/23/14 120/80  10/11/14 135/86  10/03/14 154/82   bp at home 102 to 120s/70s Overall very good  No ha or stroke symptoms  Taking care of herself  Wt is down 1 lb with bmi of 30     Saw neuro- had MRA Sunday   Wants A1C done  Due for lipid labs also on statin bmet for the hctz   Going to the gym 4 d per week and feels great  Patient Active Problem List   Diagnosis Date Noted  . CVA (cerebral infarction) 10/03/2014  . Essential hypertension 10/03/2014  . Abdominal pain, right upper quadrant 05/07/2014  . Obesity 09/21/2011  . OSTEOPENIA 03/12/2009  . Hyperlipidemia 12/13/2006  . ALLERGIC RHINITIS 12/13/2006  . GERD 12/13/2006  . OSTEOARTHRITIS 12/13/2006   Past Medical History  Diagnosis Date  . Allergy     allergic rhinitis  . GERD (gastroesophageal reflux disease)   . Hyperlipidemia   . Arthritis     OA  . Obesity   . Fibrocystic breast   . Osteopenia   . Tibia fracture 2012    playing golf    Past Surgical History  Procedure Laterality Date  . Tubal ligation  1976    lap  . Inguinal hernia repair Right 1956  . Breast cyst aspiration  1980s    aspirated breast lump  . Esophagogastroduodenoscopy endoscopy  09/18/14  . Colonoscopy  2012   History  Substance Use Topics  . Smoking status: Never Smoker   . Smokeless tobacco: Never Used  . Alcohol Use: 1.8 oz/week    3 Glasses of wine per week     Comment: occ-wine   Family History  Problem Relation Age of Onset  . Arthritis Mother   . Hyperlipidemia Mother   . Hypertension Mother   . COPD Mother   . Asthma Mother   . Stroke Father   . Heart disease Father   . Pancreatic cancer Maternal Grandfather   . Colon cancer Neg Hx   . Esophageal cancer Neg Hx   . Rectal cancer Neg Hx   .  Stomach cancer Neg Hx   . Arthritis Maternal Grandmother   . Hypertension Maternal Grandmother   . Heart disease Maternal Grandfather   . Stroke Maternal Grandmother    Allergies  Allergen Reactions  . Tetanus Toxoid Hives  . Acetaminophen Rash  . Septra [Sulfamethoxazole-Trimethoprim] Rash    Body rash   Current Outpatient Prescriptions on File Prior to Visit  Medication Sig Dispense Refill  . alendronate (FOSAMAX) 70 MG tablet Take 1 tablet (70 mg total) by mouth every 7 (seven) days. Take with a full glass of water on an empty stomach. 12 tablet 3  . atorvastatin (LIPITOR) 20 MG tablet Take 1 tablet (20 mg total) by mouth daily. 90 tablet 3  . Biotin 5 MG CAPS Take 5 mg by mouth daily.    . Calcium Carbonate-Vit D-Min 600-400 MG-UNIT TABS Take 2 tablets by mouth daily.      . clopidogrel (PLAVIX) 75 MG tablet Take 1 tablet (75 mg total) by mouth daily. 90 tablet 3  . fexofenadine (ALLEGRA) 180 MG tablet Take 180 mg by mouth daily.    Marland Kitchen  fluticasone (FLONASE) 50 MCG/ACT nasal spray Place 2 sprays into both nostrils daily. (Patient taking differently: Place 2 sprays into both nostrils as needed. ) 48 g 3  . hydrochlorothiazide (HYDRODIURIL) 25 MG tablet Take 1 tablet (25 mg total) by mouth daily. 30 tablet 3  . ibuprofen (ADVIL,MOTRIN) 200 MG tablet Take 400 mg by mouth as needed.    . Multiple Vitamin (MULTIVITAMIN) tablet Take 1 tablet by mouth daily.      . Multiple Vitamins-Minerals (HAIR/SKIN/NAILS PO) Take 2 capsules by mouth daily.    Marland Kitchen omeprazole (PRILOSEC) 20 MG capsule Take 1 capsule (20 mg total) by mouth daily. 90 capsule 3   No current facility-administered medications on file prior to visit.      Review of Systems     Objective:   Physical Exam        Assessment & Plan:

## 2014-10-23 NOTE — Telephone Encounter (Signed)
-----   Message from Rosalin Hawking, MD sent at 10/23/2014  3:17 PM EDT ----- Hello, could you please let her know that her MRA brain test was normal. Thanks.   Rosalin Hawking, MD PhD Stroke Neurology 10/23/2014 3:16 PM

## 2014-10-23 NOTE — Assessment & Plan Note (Signed)
Doing well  Pending MRA result  Has seen neurology  Goal LDL 70 or below Check A1C today

## 2014-11-05 ENCOUNTER — Encounter: Payer: Self-pay | Admitting: Family Medicine

## 2014-11-05 ENCOUNTER — Ambulatory Visit (INDEPENDENT_AMBULATORY_CARE_PROVIDER_SITE_OTHER): Payer: Medicare Other | Admitting: Family Medicine

## 2014-11-05 VITALS — BP 138/80 | HR 88 | Temp 98.8°F | Ht 60.75 in | Wt 162.8 lb

## 2014-11-05 DIAGNOSIS — H6691 Otitis media, unspecified, right ear: Secondary | ICD-10-CM | POA: Insufficient documentation

## 2014-11-05 DIAGNOSIS — H6501 Acute serous otitis media, right ear: Secondary | ICD-10-CM

## 2014-11-05 MED ORDER — AMOXICILLIN 500 MG PO CAPS: 500.0000 mg | ORAL_CAPSULE | Freq: Three times a day (TID) | ORAL | Status: DC

## 2014-11-05 NOTE — Progress Notes (Signed)
Subjective:    Patient ID: Brianna Espinoza, female    DOB: 1950/10/04, 64 y.o.   MRN: [id number]  HPI Here for discomfort in R ear  Bad pain yesterday  Some blood in it this am  Hears like she is in a bottle   Some hoarseness  Allergic rhinitis - pollen season (taking allegra) Some sneezing/runny nose   No fever   Took one ibuprofen yesterday   Has flonase for allergies as well   Patient Active Problem List   Diagnosis Date Noted  . CVA (cerebral infarction) 10/03/2014  . Essential hypertension 10/03/2014  . Abdominal pain, right upper quadrant 05/07/2014  . Obesity 09/21/2011  . OSTEOPENIA 03/12/2009  . Hyperlipidemia 12/13/2006  . ALLERGIC RHINITIS 12/13/2006  . GERD 12/13/2006  . OSTEOARTHRITIS 12/13/2006   Past Medical History  Diagnosis Date  . Allergy     allergic rhinitis  . GERD (gastroesophageal reflux disease)   . Hyperlipidemia   . Arthritis     OA  . Obesity   . Fibrocystic breast   . Osteopenia   . Tibia fracture 2012    playing golf    Past Surgical History  Procedure Laterality Date  . Tubal ligation  1976    lap  . Inguinal hernia repair Right 1956  . Breast cyst aspiration  1980s    aspirated breast lump  . Esophagogastroduodenoscopy endoscopy  09/18/14  . Colonoscopy  2012   History  Substance Use Topics  . Smoking status: Never Smoker   . Smokeless tobacco: Never Used  . Alcohol Use: 1.8 oz/week    3 Glasses of wine per week     Comment: occ-wine   Family History  Problem Relation Age of Onset  . Arthritis Mother   . Hyperlipidemia Mother   . Hypertension Mother   . COPD Mother   . Asthma Mother   . Stroke Father   . Heart disease Father   . Pancreatic cancer Maternal Grandfather   . Colon cancer Neg Hx   . Esophageal cancer Neg Hx   . Rectal cancer Neg Hx   . Stomach cancer Neg Hx   . Arthritis Maternal Grandmother   . Hypertension Maternal Grandmother   . Heart disease Maternal Grandfather   . Stroke Maternal  Grandmother    Allergies  Allergen Reactions  . Tetanus Toxoid Hives  . Acetaminophen Rash  . Septra [Sulfamethoxazole-Trimethoprim] Rash    Body rash   Current Outpatient Prescriptions on File Prior to Visit  Medication Sig Dispense Refill  . alendronate (FOSAMAX) 70 MG tablet Take 1 tablet (70 mg total) by mouth every 7 (seven) days. Take with a full glass of water on an empty stomach. 12 tablet 3  . atorvastatin (LIPITOR) 20 MG tablet Take 1 tablet (20 mg total) by mouth daily. 90 tablet 3  . Biotin 5 MG CAPS Take 5 mg by mouth daily.    . Calcium Carbonate-Vit D-Min 600-400 MG-UNIT TABS Take 2 tablets by mouth daily.      . clopidogrel (PLAVIX) 75 MG tablet Take 1 tablet (75 mg total) by mouth daily. 90 tablet 3  . fexofenadine (ALLEGRA) 180 MG tablet Take 180 mg by mouth daily.    . fluticasone (FLONASE) 50 MCG/ACT nasal spray Place 2 sprays into both nostrils daily. (Patient taking differently: Place 2 sprays into both nostrils as needed. ) 48 g 3  . hydrochlorothiazide (HYDRODIURIL) 25 MG tablet Take 1 tablet (25 mg total) by mouth daily. 30 tablet  3  . ibuprofen (ADVIL,MOTRIN) 200 MG tablet Take 400 mg by mouth as needed.    . Multiple Vitamin (MULTIVITAMIN) tablet Take 1 tablet by mouth daily.      . Multiple Vitamins-Minerals (HAIR/SKIN/NAILS PO) Take 2 capsules by mouth daily.    Marland Kitchen omeprazole (PRILOSEC) 20 MG capsule Take 1 capsule (20 mg total) by mouth daily. 90 capsule 3   No current facility-administered medications on file prior to visit.      Review of Systems    Review of Systems  Constitutional: Negative for fever, appetite change, fatigue and unexpected weight change.  ENT pos for ear pain and drainage, pos for mild congestion/allergy and R facial pain Eyes: Negative for pain and visual disturbance.  Respiratory: Negative for cough and shortness of breath.   Cardiovascular: Negative for cp or palpitations    Gastrointestinal: Negative for nausea, diarrhea and  constipation.  Genitourinary: Negative for urgency and frequency.  Skin: Negative for pallor or rash   Neurological: Negative for weakness, light-headedness, numbness and headaches.  Hematological: Negative for adenopathy. Does not bruise/bleed easily.  Psychiatric/Behavioral: Negative for dysphoric mood. The patient is not nervous/anxious.      Objective:   Physical Exam  Constitutional: She appears well-developed and well-nourished. No distress.  HENT:  Head: Normocephalic and atraumatic.  Left Ear: External ear normal.  Mouth/Throat: Oropharynx is clear and moist. No oropharyngeal exudate.  Nares are mildly congested  Mild R maxillary sinus tenderness TM L nl TM R -erythematous (with some dried blood in canal)-no obvious rupture seen today  Eyes: Conjunctivae and EOM are normal. Pupils are equal, round, and reactive to light. Right eye exhibits no discharge. Left eye exhibits no discharge.  Neck: Normal range of motion. Neck supple.  Cardiovascular: Normal rate and regular rhythm.   Pulmonary/Chest: Effort normal and breath sounds normal.  Lymphadenopathy:    She has no cervical adenopathy.  Neurological: She is alert.  Skin: Skin is warm and dry. No rash noted. No erythema.  Psychiatric: She has a normal mood and affect.          Assessment & Plan:   Problem List Items Addressed This Visit      Nervous and Auditory   Otitis media, right - Primary    Suspect it may have ruptured and then sealed back up  Cover with amox  Control allergies with antihistamine and flonase Disc symptomatic care - see instructions on AVS  Update if not starting to improve in a week or if worsening        Relevant Medications   amoxicillin (AMOXIL) capsule

## 2014-11-05 NOTE — Progress Notes (Signed)
Pre visit review using our clinic review tool, if applicable. No additional management support is needed unless otherwise documented below in the visit note. 

## 2014-11-05 NOTE — Assessment & Plan Note (Signed)
Suspect it may have ruptured and then sealed back up  Cover with amox  Control allergies with antihistamine and flonase Disc symptomatic care - see instructions on AVS  Update if not starting to improve in a week or if worsening

## 2014-11-05 NOTE — Patient Instructions (Signed)
Take amoxicillin for ear infection  Continue flonase for allergies  Drink lots of fluids Update if not starting to improve in a week or if worsening

## 2014-11-09 ENCOUNTER — Other Ambulatory Visit: Payer: Medicare Other

## 2014-11-16 ENCOUNTER — Encounter: Payer: Self-pay | Admitting: Family Medicine

## 2014-11-16 ENCOUNTER — Ambulatory Visit (INDEPENDENT_AMBULATORY_CARE_PROVIDER_SITE_OTHER): Payer: Medicare Other | Admitting: Family Medicine

## 2014-11-16 VITALS — BP 137/85 | HR 78 | Temp 98.0°F | Ht 60.75 in | Wt 160.8 lb

## 2014-11-16 DIAGNOSIS — E669 Obesity, unspecified: Secondary | ICD-10-CM

## 2014-11-16 DIAGNOSIS — Z23 Encounter for immunization: Secondary | ICD-10-CM | POA: Diagnosis not present

## 2014-11-16 DIAGNOSIS — Z0000 Encounter for general adult medical examination without abnormal findings: Secondary | ICD-10-CM | POA: Diagnosis not present

## 2014-11-16 DIAGNOSIS — M8580 Other specified disorders of bone density and structure, unspecified site: Secondary | ICD-10-CM

## 2014-11-16 DIAGNOSIS — I10 Essential (primary) hypertension: Secondary | ICD-10-CM

## 2014-11-16 DIAGNOSIS — R739 Hyperglycemia, unspecified: Secondary | ICD-10-CM

## 2014-11-16 DIAGNOSIS — E785 Hyperlipidemia, unspecified: Secondary | ICD-10-CM

## 2014-11-16 DIAGNOSIS — R7303 Prediabetes: Secondary | ICD-10-CM | POA: Insufficient documentation

## 2014-11-16 LAB — CBC WITH DIFFERENTIAL/PLATELET
BASOS PCT: 0.7 % (ref 0.0–3.0)
Basophils Absolute: 0.1 10*3/uL (ref 0.0–0.1)
EOS ABS: 0.1 10*3/uL (ref 0.0–0.7)
Eosinophils Relative: 1 % (ref 0.0–5.0)
HCT: 41.3 % (ref 36.0–46.0)
Hemoglobin: 14.2 g/dL (ref 12.0–15.0)
LYMPHS ABS: 1.7 10*3/uL (ref 0.7–4.0)
LYMPHS PCT: 21.1 % (ref 12.0–46.0)
MCHC: 34.4 g/dL (ref 30.0–36.0)
MCV: 88.4 fl (ref 78.0–100.0)
MONO ABS: 0.5 10*3/uL (ref 0.1–1.0)
Monocytes Relative: 5.7 % (ref 3.0–12.0)
NEUTROS ABS: 5.9 10*3/uL (ref 1.4–7.7)
Neutrophils Relative %: 71.5 % (ref 43.0–77.0)
PLATELETS: 294 10*3/uL (ref 150.0–400.0)
RBC: 4.67 Mil/uL (ref 3.87–5.11)
RDW: 13.4 % (ref 11.5–15.5)
WBC: 8.2 10*3/uL (ref 4.0–10.5)

## 2014-11-16 LAB — TSH: TSH: 1.56 u[IU]/mL (ref 0.35–4.50)

## 2014-11-16 MED ORDER — FLUTICASONE PROPIONATE 50 MCG/ACT NA SUSP: 2.0000 | Freq: Every day | NASAL | Status: DC

## 2014-11-16 MED ORDER — ALENDRONATE SODIUM 70 MG PO TABS: 70.0000 mg | ORAL_TABLET | ORAL | Status: DC

## 2014-11-16 MED ORDER — OMEPRAZOLE 20 MG PO CPDR: 20.0000 mg | DELAYED_RELEASE_CAPSULE | Freq: Every day | ORAL | Status: DC

## 2014-11-16 NOTE — Progress Notes (Signed)
Subjective:    Patient ID: Brianna Espinoza, female    DOB: 1950/10/15, 64 y.o.   MRN: [id number]  HPI Here for annual medicare wellness visit as well as chronic/acute medical problems   I have personally reviewed the Medicare Annual Wellness questionnaire and have noted 1. The patient's medical and social history 2. Their use of alcohol, tobacco or illicit drugs 3. Their current medications and supplements 4. The patient's functional ability including ADL's, fall risks, home safety risks and hearing or visual             impairment. 5. Diet and physical activities 6. Evidence for depression or mood disorders  The patients weight, height, BMI have been recorded in the chart and visual acuity is per eye clinic.  I have made referrals, counseling and provided education to the patient based review of the above and I have provided the pt with a written personalized care plan for preventive services.  Feels fine overall   See scanned forms.  Routine anticipatory guidance given to patient.  See health maintenance. Colon cancer screening 11/12 colonosc nl  Not interested in Hep C screening  Breast cancer screening mammogram 9/15  Self breast exam- no lumps  Flu vaccine 10/15 Tetanus vaccine - declines  Pneumovax-needs prevnar  Zoster vaccine 7/15  dexa 12/13 - she wants to get that done at the same time as her mammogram in the fall  No new fx / last was 2012 , no problems with fosamax , taking her ca and D Advance directive - has a living will and POA  Cognitive function addressed- see scanned forms- and if abnormal then additional documentation follows. -no problems, she has to remember things for everyone else   PMH and SH reviewed  Meds, vitals, and allergies reviewed.   ROS: See HPI.  Otherwise negative.      bp is stable today  No cp or palpitations or headaches or edema  No side effects to medicines  BP Readings from Last 3 Encounters:  11/16/14 137/85  11/05/14 138/80    10/23/14 120/80    Hyperlipidemia Lab Results  Component Value Date   CHOL 140 10/23/2014   CHOL 195 11/09/2013   CHOL 189 03/13/2010   Lab Results  Component Value Date   HDL 43.50 10/23/2014   HDL 51.20 11/09/2013   HDL 51 03/13/2010   Lab Results  Component Value Date   LDLCALC 82 10/23/2014   LDLCALC 128* 11/09/2013   LDLCALC 125* 03/13/2010   Lab Results  Component Value Date   TRIG 71.0 10/23/2014   TRIG 79.0 11/09/2013   TRIG 63 03/13/2010   Lab Results  Component Value Date   CHOLHDL 3 10/23/2014   CHOLHDL 4 11/09/2013   CHOLHDL 3.7 Ratio 03/13/2010   No results found for: LDLDIRECT   Overall improved  lipitor and diet    Wt is down 2 lb with bmi of 30  Eating well  Going to the gym 3 days per week  She wants to try and play golf now (still has some tingling in fingers after stroke)   Otherwise she has recovered well from her stroke   Glucose control  Lab Results  Component Value Date   HGBA1C 6.0 10/23/2014   Recently cutting back on sweets  Working on exercise  Working on weight loss -goal is to get to 155 and go from there  Lab Results  Component Value Date   TSH 1.31 11/09/2013    Lab Results  Component Value Date   WBC 11.3* 07/20/2014   HGB 13.5 07/20/2014   HCT 41.6 07/20/2014   MCV 91.6 07/20/2014   PLT 256.0 07/20/2014      Chemistry      Component Value Date/Time   NA 137 10/23/2014 0938   K 3.9 10/23/2014 0938   CL 99 10/23/2014 0938   CO2 33* 10/23/2014 0938   BUN 19 10/23/2014 0938   CREATININE 0.94 10/23/2014 0938      Component Value Date/Time   CALCIUM 10.0 10/23/2014 0938   ALKPHOS 69 07/20/2014 1354   AST 21 07/20/2014 1354   ALT 18 07/20/2014 1354   BILITOT 0.5 07/20/2014 1354        Patient Active Problem List   Diagnosis Date Noted  . Encounter for Medicare annual wellness exam 11/16/2014  . Hyperglycemia 11/16/2014  . Otitis media, right 11/05/2014  . CVA (cerebral infarction) 10/03/2014  .  Essential hypertension 10/03/2014  . Abdominal pain, right upper quadrant 05/07/2014  . Obesity 09/21/2011  . Osteopenia 03/12/2009  . Hyperlipidemia 12/13/2006  . ALLERGIC RHINITIS 12/13/2006  . GERD 12/13/2006  . OSTEOARTHRITIS 12/13/2006   Past Medical History  Diagnosis Date  . Allergy     allergic rhinitis  . GERD (gastroesophageal reflux disease)   . Hyperlipidemia   . Arthritis     OA  . Obesity   . Fibrocystic breast   . Osteopenia   . Tibia fracture 2012    playing golf    Past Surgical History  Procedure Laterality Date  . Tubal ligation  1976    lap  . Inguinal hernia repair Right 1956  . Breast cyst aspiration  1980s    aspirated breast lump  . Esophagogastroduodenoscopy endoscopy  09/18/14  . Colonoscopy  2012   History  Substance Use Topics  . Smoking status: Never Smoker   . Smokeless tobacco: Never Used  . Alcohol Use: 1.8 oz/week    3 Glasses of wine per week     Comment: occ-wine   Family History  Problem Relation Age of Onset  . Arthritis Mother   . Hyperlipidemia Mother   . Hypertension Mother   . COPD Mother   . Asthma Mother   . Stroke Father   . Heart disease Father   . Pancreatic cancer Maternal Grandfather   . Colon cancer Neg Hx   . Esophageal cancer Neg Hx   . Rectal cancer Neg Hx   . Stomach cancer Neg Hx   . Arthritis Maternal Grandmother   . Hypertension Maternal Grandmother   . Heart disease Maternal Grandfather   . Stroke Maternal Grandmother    Allergies  Allergen Reactions  . Tetanus Toxoid Hives  . Acetaminophen Rash  . Septra [Sulfamethoxazole-Trimethoprim] Rash    Body rash   Current Outpatient Prescriptions on File Prior to Visit  Medication Sig Dispense Refill  . atorvastatin (LIPITOR) 20 MG tablet Take 1 tablet (20 mg total) by mouth daily. 90 tablet 3  . Biotin 5 MG CAPS Take 5 mg by mouth daily.    . Calcium Carbonate-Vit D-Min 600-400 MG-UNIT TABS Take 2 tablets by mouth daily.      . clopidogrel  (PLAVIX) 75 MG tablet Take 1 tablet (75 mg total) by mouth daily. 90 tablet 3  . fexofenadine (ALLEGRA) 180 MG tablet Take 180 mg by mouth daily.    . hydrochlorothiazide (HYDRODIURIL) 25 MG tablet Take 1 tablet (25 mg total) by mouth daily. 30 tablet 3  .  ibuprofen (ADVIL,MOTRIN) 200 MG tablet Take 400 mg by mouth as needed.    . Multiple Vitamin (MULTIVITAMIN) tablet Take 1 tablet by mouth daily.      . Multiple Vitamins-Minerals (HAIR/SKIN/NAILS PO) Take 2 capsules by mouth daily.     No current facility-administered medications on file prior to visit.    Review of Systems Review of Systems  Constitutional: Negative for fever, appetite change, fatigue and unexpected weight change.  Eyes: Negative for pain and visual disturbance.  Respiratory: Negative for cough and shortness of breath.   Cardiovascular: Negative for cp or palpitations    Gastrointestinal: Negative for nausea, diarrhea and constipation.  Genitourinary: Negative for urgency and frequency.  Skin: Negative for pallor or rash   Neurological: Negative for weakness, light-headedness, numbness and headaches.  Hematological: Negative for adenopathy. Does not bruise/bleed easily.  Psychiatric/Behavioral: Negative for dysphoric mood. The patient is not nervous/anxious.         Objective:   Physical Exam  Constitutional: She appears well-developed and well-nourished. No distress.  obese and well appearing   HENT:  Head: Normocephalic and atraumatic.  Right Ear: External ear normal.  Left Ear: External ear normal.  Mouth/Throat: Oropharynx is clear and moist.  Eyes: Conjunctivae and EOM are normal. Pupils are equal, round, and reactive to light. No scleral icterus.  Neck: Normal range of motion. Neck supple. No JVD present. Carotid bruit is not present. No thyromegaly present.  Cardiovascular: Normal rate, regular rhythm, normal heart sounds and intact distal pulses.  Exam reveals no gallop.   Pulmonary/Chest: Effort normal  and breath sounds normal. No respiratory distress. She has no wheezes. She exhibits no tenderness.  Abdominal: Soft. Bowel sounds are normal. She exhibits no distension, no abdominal bruit and no mass. There is no tenderness.  Genitourinary: No breast swelling, tenderness, discharge or bleeding.  Breast exam: No mass, nodules, thickening, tenderness, bulging, retraction, inflamation, nipple discharge or skin changes noted.  No axillary or clavicular LA.      Musculoskeletal: Normal range of motion. She exhibits no edema or tenderness.  No kyphosis   Lymphadenopathy:    She has no cervical adenopathy.  Neurological: She is alert. She has normal reflexes. No cranial nerve deficit. She exhibits normal muscle tone. Coordination normal.  Skin: Skin is warm and dry. No rash noted. No erythema. No pallor.  Psychiatric: She has a normal mood and affect.          Assessment & Plan:   Problem List Items Addressed This Visit      Cardiovascular and Mediastinum   Essential hypertension    bp in fair control at this time  BP Readings from Last 1 Encounters:  11/16/14 137/85   No changes needed Disc lifstyle change with low sodium diet and exercise  Labs reviewed Check tsh and cbc today      Relevant Orders   TSH (Completed)   CBC with Differential/Platelet (Completed)     Musculoskeletal and Integument   Osteopenia    Disc need for calcium/ vitamin D/ wt bearing exercise and bone density test every 2 y to monitor Disc safety/ fracture risk in detail  Doing well on fosamax No more fx (last 2012) She plans to get dexa in the fall with her mammogram         Other   Encounter for Medicare annual wellness exam - Primary    Reviewed health habits including diet and exercise and skin cancer prevention Reviewed appropriate screening tests for age  Also reviewed health mt list, fam hx and immunization status , as well as social and family history   See HPI Labs rev Ordered tsh and cbc  today  prevnar vaccine today  Declines hep C screening         Relevant Orders   Pneumococcal conjugate vaccine 13-valent IM (Completed)   Hyperglycemia    Lab Results  Component Value Date   HGBA1C 6.0 10/23/2014   Rev low glycemic diet and goal for wt loss       Hyperlipidemia    Lab Results  Component Value Date   CHOL 140 10/23/2014   HDL 43.50 10/23/2014   LDLCALC 82 10/23/2014   TRIG 71.0 10/23/2014   CHOLHDL 3 10/23/2014   Doing better on atorvastatin and diet       Obesity    Discussed how this problem influences overall health and the risks it imposes  Reviewed plan for weight loss with lower calorie diet (via better food choices and also portion control or program like weight watchers) and exercise building up to or more than 30 minutes 5 days per week including some aerobic activity   Is loosing wt Commended        Other Visit Diagnoses    Need for prophylactic vaccination against Streptococcus pneumoniae (pneumococcus)        Relevant Orders    Pneumococcal conjugate vaccine 13-valent IM (Completed)

## 2014-11-16 NOTE — Assessment & Plan Note (Signed)
Disc need for calcium/ vitamin D/ wt bearing exercise and bone density test every 2 y to monitor Disc safety/ fracture risk in detail  Doing well on fosamax No more fx (last 2012) She plans to get dexa in the fall with her mammogram

## 2014-11-16 NOTE — Patient Instructions (Signed)
prevnar vaccine today  Labs today  Eat a healthy low fat and low sugar diet and keep exercising

## 2014-11-16 NOTE — Assessment & Plan Note (Signed)
Discussed how this problem influences overall health and the risks it imposes  Reviewed plan for weight loss with lower calorie diet (via better food choices and also portion control or program like weight watchers) and exercise building up to or more than 30 minutes 5 days per week including some aerobic activity   Is loosing wt Commended

## 2014-11-16 NOTE — Assessment & Plan Note (Signed)
Lab Results  Component Value Date   CHOL 140 10/23/2014   HDL 43.50 10/23/2014   LDLCALC 82 10/23/2014   TRIG 71.0 10/23/2014   CHOLHDL 3 10/23/2014   Doing better on atorvastatin and diet

## 2014-11-16 NOTE — Assessment & Plan Note (Signed)
Lab Results  Component Value Date   HGBA1C 6.0 10/23/2014   Rev low glycemic diet and goal for wt loss

## 2014-11-16 NOTE — Progress Notes (Signed)
Pre visit review using our clinic review tool, if applicable. No additional management support is needed unless otherwise documented below in the visit note. 

## 2014-11-16 NOTE — Assessment & Plan Note (Signed)
Reviewed health habits including diet and exercise and skin cancer prevention Reviewed appropriate screening tests for age  Also reviewed health mt list, fam hx and immunization status , as well as social and family history   See HPI Labs rev Ordered tsh and cbc today  prevnar vaccine today  Declines hep C screening

## 2014-11-16 NOTE — Assessment & Plan Note (Signed)
bp in fair control at this time  BP Readings from Last 1 Encounters:  11/16/14 137/85   No changes needed Disc lifstyle change with low sodium diet and exercise  Labs reviewed Check tsh and cbc today

## 2014-12-02 NOTE — Discharge Summary (Signed)
PATIENT NAME:  [name], [name] MR#:  [id number] DATE OF BIRTH:  08-Feb-1951  DATE OF ADMISSION:  09/26/2014 DATE OF DISCHARGE:  09/27/2014  PRESENTING COMPLAINT: Left facial, upper and lower extremity numbness.   DISCHARGE DIAGNOSES: Acute right thalamic infarct.   CONDITION ON DISCHARGE: Fair.   CODE STATUS: Full code.   MEDICATIONS:  1.  Omeprazole 20 mg daily.  2.  Allegra 180 mg daily as needed.  3.  Hair, Skin, Nails 5 mg p.o. daily.  4.  Calcium with vitamin D 2 tablets p.o. daily.  5.  Fluticasone nasal spray once a day as needed.  6.  Alendronate 70 mg once a week on Thursday.  7.  Multivitamin 2 tablets daily.  8.  Atorvastatin 20 mg daily.  9.  Aspirin 325 mg p.o. daily for 1 week and then 81 mg daily.   FOLLOWUP: With Dr. Loura Pardon at [facility] in 1-2 weeks.   LABORATORY AND RADIOGRAPHIC DATA:  1.  Ultrasound Doppler shows minimal amount of bilateral intimal wall thickening, left subjectively greater than right.  2.  Echo Doppler showed EF of 70%-75%, normal left ventricular systolic function, mild mitral valve regurgitation, and mild tricuspid regurgitation.  3.  MRI of the brain shows 1 cm acute infarct involving the lateral right thalamus, no associated hemorrhage or significant mass effect.  4.  Urine negative for UTI.  5.  LDL is 110, HDL is 48.  6.  CBC and basic metabolic panel within normal limits.   HOSPITAL COURSE: Brianna Espinoza is a very pleasant 65 year old Caucasian female with past medical history of acid reflux comes in with:  1.  Acute CVA, which appears to be in a right thalamic infarct. She came in with left arm numbness along with facial numbness. She was started on aspirin and statin therapy. Feels much better. Seen by PT. Carotid Doppler and echocardiogram looks okay. Statin started. Overall, her symptoms were resolving.  2.  GERD. Continue PPI.  3.  DVT prophylaxis. SCDs and TEDs and she ambulatory.   Hospital stay otherwise remained stable.  She will follow up with her primary care physician, which is Dr. Loura Pardon in 1-2 weeks.  TIME SPENT: 40 minutes.    ____________________________ Hart Rochester Posey Pronto, MD sap:bm D: 09/28/2014 15:39:55 ET T: 09/29/2014 02:22:38 ET JOB#: [id number]  cc: Tanza Pellot A. Posey Pronto, MD, <Dictator> Marne A. Glori Bickers, MD Ilda Basset MD ELECTRONICALLY SIGNED 10/02/2014 17:37

## 2014-12-02 NOTE — H&P (Signed)
PATIENT NAME:  Brianna, BARRIERE MR#:  [id number] DATE OF BIRTH:  [birth year]  DATE OF ADMISSION:  09/26/2014  REFERRING PHYSICIAN:  schaveitz   PRIMARY CARE PHYSICIAN:  Marne A. Tower, MD.  CHIEF COMPLAINT: Left arm numbness.    HISTORY OF PRESENT ILLNESS:  This is a 64 year old Caucasian female with a history of gastroesophageal disease without esophagitis, presenting with left arm numbness. She describes acute onset of left arm numbness around 3:25 p.m. on 09/26/2014 of the left face, arm and left leg numbness with associated paresthesias of the left hand.  Symptoms have mostly improved by presentation to the Emergency Department.  She actually underwent an MRI which revealed an acute CVA.  She denies any further symptomatology at this time.   REVIEW OF SYSTEMS:   CONSTITUTIONAL: Denies fevers, chills, fatigue, weakness.  EYES: Denies blurred, double vision or eye pain.   ENT: Denies tinnitus, ear pain or hearing loss.  RESPIRATORY: Denies cough, wheeze, shortness of breath.  CARDIOVASCULAR: Denies chest pain, palpitations or edema.  GASTROINTESTINAL: Denies nausea, vomiting, diarrhea, abdominal pain.   GENITOURINARY: Denies dysuria, hematuria.  ENDOCRINE: Denies nocturia or thyroid problems.  HEMATOLOGIC AND LYMPHATIC: Denies easy bruising or bleeding.  SKIN: Denies rashes or lesions.  MUSCULOSKELETAL: Denies pain in neck, back, shoulder, knees, hips or arthritic symptoms.  NEUROLOGIC: Positive for paresthesias and numbness as described above.  PSYCHIATRIC: Denies anxiety or depressive symptoms.   Otherwise, full review of systems is performed and is negative.   PAST MEDICAL HISTORY: Osteopenia as well as gastroesophageal reflux disease without esophagitis.   SOCIAL HISTORY: Occasional alcohol. Denies any tobacco or drug use.   FAMILY HISTORY: Denies any known cardiovascular or pulmonary disorders  ALLERGIES: SEPTRA AND TETANUS-DIPHTHERIA TOXOIDS AS WELL AS TYLENOL.   HOME  MEDICATIONS: Include Allegra 180 mg p.o. daily as needed for allergies, alendronate 70 mg p.o. weekly, Flonase 50 mcg nasal spray daily as needed for allergies, Prilosec 20 mg p.o. daily, calcium 600+ vitamin D 2 tablets p.o. daily  PHYSICAL EXAMINATION:  VITAL SIGNS: Temperature 98.4, heart rate 89, respirations 16, blood pressure 159/88, saturating 99% on room air. Weight 72.5 kg, BMI 30.2. GENERAL:  Well-nourished, well-developed Caucasian female currently in no acute distress.   HEAD: Normocephalic, atraumatic.  EYES: Pupils equal, round and reactive to light. Extraocular muscles intact. No scleral icterus. MOUTH: Moist mucosal membrane. Dentition intact. No abscess noted.  EARS, NOSE, AND THROAT: Clear without exudates. No external lesions.  NECK: Supple. No thyromegaly. No nodules. No JVD.  PULMONARY: Clear to auscultation bilaterally without wheezes, rales or rhonchi. No use of accessory muscles. Good respiratory effort.  CHEST: Nontender to palpation.  CARDIOVASCULAR: S1, S2. Regular rate and rhythm. No murmurs, rubs or gallops. No edema. Pedal pulses 2+ bilaterally.  GASTROINTESTINAL: Soft, nontender, nondistended.  No masses.  Positive bowel sounds. No hepatosplenomegaly.  MUSCULOSKELETAL: No swelling, clubbing or edema. Range of motion full in all extremities.  NEUROLOGIC: Cranial nerves II through XII intact. No gross focal neurological deficits. Sensation intact. Reflexes intact.  Strength 5/5 in all extremities including proximal and distal flexion and extension.  Pronator drift within normal limits.   SKIN: No ulceration, lesion, rashes or cyanosis. Skin warm, dry. Turgor intact.  PSYCHIATRIC: Mood and affect within normal limits. Patient awake, alert and oriented x3. Insight and judgment intact.   LABORATORY DATA: CT head performed which reveals no acute abnormality. Chest x-ray performed reveals no acute cardiopulmonary process. MRI of brain performed which reveals 1 cm acute,  ischemic  infarct involving the lateral right thalamus.  No associated hemorrhage or mass effect. Remainder of laboratory data: Sodium of 140, potassium 6, chloride 108, bicarbonate 27, BUN 13, glucose of 111, AST of 44; otherwise, LFTs within normal limits. WBC of 7.9, hemoglobin 13.5, platelets of 239,000. Urinalysis within normal limits.   ASSESSMENT AND PLAN: A 64 year old Caucasian female with a history of gastroesophageal reflux disease without esophagitis, presenting with left arm numbness, found to have acute cerebrovascular accident.  1. Acute cerebrovascular accident, unspecified mechanism: Place on telemetry. Initiate aspirin and statin therapy. Neurological checks q. 4 hours, check carotid Dopplers, lipid panel as well as transthoracic echocardiogram.  In searching for further etiology, we will also consult neurology.  2. Hyperkalemia with slight, hemolysis: We will recheck basic metabolic panel searching for potassium level.  3. Gastroesophageal reflux disease without esophagitis. Proton pump inhibitor therapy.  4. Deep venous thrombosis prophylaxis with sequential compression devices.  Avoid heparin first 24-48 hours.    CODE STATUS:  The patient is full code.   TIME SPENT: 45 minutes.      ____________________________ Aaron Mose. Hower, MD dkh:by D: 09/26/2014 22:02:36 ET T: 09/26/2014 22:38:42 ET JOB#: [id number]  cc: Aaron Mose. Hower, MD, <Dictator> DAVID Woodfin Ganja MD ELECTRONICALLY SIGNED 09/27/2014 3:14

## 2014-12-04 ENCOUNTER — Other Ambulatory Visit: Payer: Medicare Other

## 2014-12-17 ENCOUNTER — Encounter: Payer: Self-pay | Admitting: Neurology

## 2014-12-17 ENCOUNTER — Ambulatory Visit (INDEPENDENT_AMBULATORY_CARE_PROVIDER_SITE_OTHER): Payer: Medicare Other | Admitting: Neurology

## 2014-12-17 VITALS — BP 142/83 | HR 73 | Wt 162.2 lb

## 2014-12-17 DIAGNOSIS — E785 Hyperlipidemia, unspecified: Secondary | ICD-10-CM | POA: Diagnosis not present

## 2014-12-17 DIAGNOSIS — I63311 Cerebral infarction due to thrombosis of right middle cerebral artery: Secondary | ICD-10-CM | POA: Insufficient documentation

## 2014-12-17 DIAGNOSIS — I10 Essential (primary) hypertension: Secondary | ICD-10-CM

## 2014-12-17 NOTE — Progress Notes (Signed)
NEUROLOGY CLINIC FOLLOW UP NOTE  NAME: Brianna Espinoza DOB: January 03, 1951  REASON FOR VISIT: stroke follow up HISTORY FROM: Chart and patient  Today we had the pleasure of seeing Brianna Espinoza in follow-up at our Neurology Clinic. Pt was accompanied by no one.   History summary  Brianna Espinoza is a left handed 64 y.o. female with PMH of osteoporosis who seen on 10/11/14 as a new patient for stroke follow up. Patient stated that about 2 weeks prior she had a sudden onset left face, left arm and left leg numbness and tingling. She denies any weakness, vision changes, slurry speech. She called EMS, and was sent to [facility] where she had a CT head showed no acute changes however MRI showed right thalamus lacunar stroke. Her stroke workup there including carotid Doppler and 2-D echo were unremarkable. Her LDL 110. She was discharged with aspirin and Lipitor. She follow-up with Dr. Glori Bickers and was put on HCTZ for blood pressure control. She was referred here for further evaluation. For the last 2 weeks, patient stated that her symptoms gradually getting better, so far she only had intermittent left leg and left fingertip numbness tingling whenever she is tired. She denies any history of hypertension, diabetes or hyperlipidemia. She denies any history of smoking or illicit drug use. She occasionally drink a glass of wine. Family history including mom had COPD and died of age 68. Father had heart attack at 42 and stroke at 75. Grandma had history of stroke.  Interval history During the interval time, pt has been doing well. Her ASA was changed last time and she is on plavix and lipitor now and no side effects. Her left handwriting is much better than before and almost back to baseline. She still has intermittent finger tingling sensation at left hand but not feeling numbness on touch. Her BP at home 120/70s but today in clinic 142/83. She follows with Dr. Alba Cory regularly. Denies s/s of  OSA.   Past Medical History  Diagnosis Date  . Allergy     allergic rhinitis  . GERD (gastroesophageal reflux disease)   . Hyperlipidemia   . Arthritis     OA  . Obesity   . Fibrocystic breast   . Osteopenia   . Tibia fracture 2012    playing golf    Past Surgical History  Procedure Laterality Date  . Tubal ligation  1976    lap  . Inguinal hernia repair Right 1956  . Breast cyst aspiration  1980s    aspirated breast lump  . Esophagogastroduodenoscopy endoscopy  09/18/14  . Colonoscopy  2012   Family History  Problem Relation Age of Onset  . Arthritis Mother   . Hyperlipidemia Mother   . Hypertension Mother   . COPD Mother   . Asthma Mother   . Stroke Father   . Heart disease Father   . Pancreatic cancer Maternal Grandfather   . Colon cancer Neg Hx   . Esophageal cancer Neg Hx   . Rectal cancer Neg Hx   . Stomach cancer Neg Hx   . Arthritis Maternal Grandmother   . Hypertension Maternal Grandmother   . Heart disease Maternal Grandfather   . Stroke Maternal Grandmother    Current Outpatient Prescriptions  Medication Sig Dispense Refill  . alendronate (FOSAMAX) 70 MG tablet Take 1 tablet (70 mg total) by mouth every 7 (seven) days. Take with a full glass of water on an empty stomach. 12 tablet 3  .  atorvastatin (LIPITOR) 20 MG tablet Take 1 tablet (20 mg total) by mouth daily. 90 tablet 3  . Biotin 5 MG CAPS Take 5 mg by mouth daily.    . Calcium Carbonate-Vit D-Min 600-400 MG-UNIT TABS Take 2 tablets by mouth daily.      . clopidogrel (PLAVIX) 75 MG tablet Take 1 tablet (75 mg total) by mouth daily. 90 tablet 3  . fexofenadine (ALLEGRA) 180 MG tablet Take 180 mg by mouth daily.    . fluticasone (FLONASE) 50 MCG/ACT nasal spray Place 2 sprays into both nostrils daily. 48 g 3  . hydrochlorothiazide (HYDRODIURIL) 25 MG tablet Take 1 tablet (25 mg total) by mouth daily. 30 tablet 3  . ibuprofen (ADVIL,MOTRIN) 200 MG tablet Take 400 mg by mouth as needed.    .  Multiple Vitamin (MULTIVITAMIN) tablet Take 1 tablet by mouth daily.      Marland Kitchen omeprazole (PRILOSEC) 20 MG capsule Take 1 capsule (20 mg total) by mouth daily. 90 capsule 3   No current facility-administered medications for this visit.   Allergies  Allergen Reactions  . Tetanus Toxoid Hives  . Acetaminophen Rash  . Septra [Sulfamethoxazole-Trimethoprim] Rash    Body rash   History   Social History  . Marital Status: Married    Spouse Name: N/A  . Number of Children: 1  . Years of Education: 14   Occupational History  . retired    Social History Main Topics  . Smoking status: Never Smoker   . Smokeless tobacco: Never Used  . Alcohol Use: 1.8 oz/week    3 Glasses of wine per week     Comment: occ-wine  . Drug Use: No  . Sexual Activity: Not on file   Other Topics Concern  . Not on file   Social History Narrative   Patient is married with one child.   Patient is left handed.   Patient has 14 yrs of education.   Patient drinks 1 and 1/2 cup daily.    Review of Systems Full 14 system review of systems performed and notable only for those listed, all others are neg:  Constitutional:   Cardiovascular:  Ear/Nose/Throat:   Skin:  Eyes:   Respiratory:   Gastroitestinal:   Genitourinary:  Hematology/Lymphatic:   Endocrine:  Musculoskeletal:   Allergy/Immunology:   Neurological:   Psychiatric:  Sleep:    Physical Exam  Filed Vitals:   12/17/14 0827  BP: 142/83  Pulse: 73    General - Well nourished, well developed, in no apparent distress.  Ophthalmologic - Sharp disc margins OU.  Cardiovascular - Regular rate and rhythm with no murmur. Carotid pulses were 2+ without bruits.   Neck - supple, no nuchal rigidity .  Mental Status -  Level of arousal and orientation to time, place, and person were intact. Language including expression, naming, repetition, comprehension, reading, and writing was assessed and found intact. Attention span and concentration  were normal. Recent and remote memory were intact. Fund of Knowledge was assessed and was intact.  Cranial Nerves II - XII - II - Visual field intact OU. III, IV, VI - Extraocular movements intact. V - Facial sensation intact bilaterally. VII - Facial movement intact bilaterally. VIII - Hearing & vestibular intact bilaterally X - Palate elevates symmetrically. XI - Chin turning & shoulder shrug intact bilaterally. XII - Tongue protrusion intact.  Motor Strength - The patient's strength was normal in all extremities and pronator drift was absent.  Bulk was normal and fasciculations  were absent.   Motor Tone - Muscle tone was assessed at the neck and appendages and was normal.  Reflexes - The patient's reflexes were normal in all extremities and she had no pathological reflexes.  Sensory - Light touch, temperature/pinprick, vibration and proprioception, and Romberg testing were assessed and were normal.    Coordination - The patient had normal movements in the hands and feet with no ataxia or dysmetria.  Tremor was absent.  Gait and Station - The patient's transfers, posture, gait, station, and turns were observed as normal.   Imaging MRI head 09/26/2014 1 cm acute ischemic infarct involving the lateral right thalamus. No associated hemorrhage or significant mass effect.  MRA 10/22/14 - This is a normal noncontrasted MR angiogram of the intracerebral arteries.  CT head 09/26/14 Normal for age noncontrast CT appearance of the brain  Carotid Doppler  minimal amount of bilateral intimal wall thickening, left subjectively greater than right, not resulting in the hemodynamically significant stenosis.  2-D echo - Left ventricular ejection fraction is 70-75% - Normal global left ventricular systolic function - Mild mitral valve regurgitation - Mild tricuspid regurgitation  Lab Review  Component     Latest Ref Rng 10/23/2014 11/16/2014  Cholesterol     0 - 200 mg/dL 140    Triglycerides     0.0 - 149.0 mg/dL 71.0   HDL Cholesterol     >39.00 mg/dL 43.50   VLDL     0.0 - 40.0 mg/dL 14.2   LDL (calc)     0 - 99 mg/dL 82   Total CHOL/HDL Ratio      3   NonHDL      96.50   Hemoglobin A1C     4.6 - 6.5 % 6.0   TSH     0.35 - 4.50 uIU/mL  1.56     Assessment:   In summary, Brianna Espinoza is a 64 y.o. female with PMH of osteoporosis follows up in clinic for right thalamic stroke on MRI. Still has intermittent left hand fingertip tingling. Exam normal. Her stroke risk factor including age, hyperlipidemia, and hypertension. Carotid Doppler and a 2-D echo unremarkable. MRA and A1c normal. Switch aspirin to Plavix. And continued on lipitor.  Plan: - Continue plavix and lipitor for stroke prevention - Check BP at home - Follow up with your primary care physician for stroke risk factor modification. Recommend maintain blood pressure goal <130/80, diabetes with hemoglobin A1c goal below 6.5% and lipids with LDL cholesterol goal below 70 mg/dL.  - RTC in 6 months  No orders of the defined types were placed in this encounter.    No orders of the defined types were placed in this encounter.    Patient Instructions  - Continue plavix and lipitor for stroke prevention - Check BP at home - Follow up with your primary care physician for stroke risk factor modification. Recommend maintain blood pressure goal <130/80, diabetes with hemoglobin A1c goal below 6.5% and lipids with LDL cholesterol goal below 70 mg/dL.  - RTC in 6 months    Brianna Hawking, MD PhD [facility] [address], [place], [place] [address] [phone]

## 2014-12-17 NOTE — Patient Instructions (Signed)
-   Continue plavix and lipitor for stroke prevention - Check BP at home - Follow up with your primary care physician for stroke risk factor modification. Recommend maintain blood pressure goal <130/80, diabetes with hemoglobin A1c goal below 6.5% and lipids with LDL cholesterol goal below 70 mg/dL.  - RTC in 6 months

## 2015-01-13 ENCOUNTER — Encounter: Payer: Self-pay | Admitting: Family Medicine

## 2015-01-25 ENCOUNTER — Other Ambulatory Visit: Payer: Self-pay | Admitting: *Deleted

## 2015-01-25 MED ORDER — HYDROCHLOROTHIAZIDE 25 MG PO TABS: 25.0000 mg | ORAL_TABLET | Freq: Every day | ORAL | Status: DC

## 2015-04-01 ENCOUNTER — Other Ambulatory Visit: Payer: Self-pay

## 2015-04-01 ENCOUNTER — Telehealth: Payer: Self-pay | Admitting: Family Medicine

## 2015-04-01 DIAGNOSIS — E2839 Other primary ovarian failure: Secondary | ICD-10-CM

## 2015-04-01 DIAGNOSIS — Z1231 Encounter for screening mammogram for malignant neoplasm of breast: Secondary | ICD-10-CM

## 2015-04-01 NOTE — Telephone Encounter (Signed)
Order done

## 2015-04-01 NOTE — Addendum Note (Signed)
Addended by: Loura Pardon A on: 04/01/2015 10:29 AM   Modules accepted: Orders

## 2015-04-01 NOTE — Telephone Encounter (Signed)
Patient scheduled her appointment for her Mammogram on 05/21/15 at 8:00.  Patient said Dr.Tower wanted her to have a Dexa Scan done.  Patient is asking for an order for the Dexa Scan  be sent to [facility] in [place].  She'd like to have it done the same day as her Mammogram.

## 2015-04-03 ENCOUNTER — Encounter: Payer: Self-pay | Admitting: Family Medicine

## 2015-05-21 ENCOUNTER — Ambulatory Visit: Payer: Medicare Other

## 2015-05-21 ENCOUNTER — Ambulatory Visit
Admission: RE | Admit: 2015-05-21 | Discharge: 2015-05-21 | Disposition: A | Discharge status: Home / Self Care | Payer: Medicare Other | Source: Ambulatory Visit

## 2015-05-21 ENCOUNTER — Ambulatory Visit
Admission: RE | Admit: 2015-05-21 | Discharge: 2015-05-21 | Disposition: A | Discharge status: Home / Self Care | Payer: Medicare Other | Source: Ambulatory Visit | Attending: Family Medicine | Admitting: Family Medicine

## 2015-05-21 DIAGNOSIS — Z1231 Encounter for screening mammogram for malignant neoplasm of breast: Secondary | ICD-10-CM

## 2015-05-21 DIAGNOSIS — E2839 Other primary ovarian failure: Secondary | ICD-10-CM

## 2015-05-24 ENCOUNTER — Ambulatory Visit (INDEPENDENT_AMBULATORY_CARE_PROVIDER_SITE_OTHER): Payer: Medicare Other

## 2015-05-24 DIAGNOSIS — Z23 Encounter for immunization: Secondary | ICD-10-CM

## 2015-06-17 ENCOUNTER — Ambulatory Visit (INDEPENDENT_AMBULATORY_CARE_PROVIDER_SITE_OTHER): Payer: Medicare Other | Admitting: Neurology

## 2015-06-17 ENCOUNTER — Encounter: Payer: Self-pay | Admitting: Neurology

## 2015-06-17 VITALS — BP 140/85 | HR 80 | Ht 61.0 in | Wt 162.4 lb

## 2015-06-17 DIAGNOSIS — I63311 Cerebral infarction due to thrombosis of right middle cerebral artery: Secondary | ICD-10-CM | POA: Diagnosis not present

## 2015-06-17 DIAGNOSIS — I10 Essential (primary) hypertension: Secondary | ICD-10-CM | POA: Diagnosis not present

## 2015-06-17 DIAGNOSIS — E785 Hyperlipidemia, unspecified: Secondary | ICD-10-CM

## 2015-06-17 MED ORDER — ASPIRIN EC 325 MG PO TBEC: 325.0000 mg | DELAYED_RELEASE_TABLET | Freq: Every day | ORAL | Status: AC

## 2015-06-17 NOTE — Patient Instructions (Signed)
-   switch from plavix to full dose ASA for stroke prevention and also arthritis treatment - Continue lipitor for stroke prevention - Check BP at home - Follow up with your primary care physician for stroke risk factor modification. Recommend maintain blood pressure goal <130/80, diabetes with hemoglobin A1c goal below 6.5% and lipids with LDL cholesterol goal below 70 mg/dL.  - follow up in one year.

## 2015-06-17 NOTE — Progress Notes (Signed)
NEUROLOGY CLINIC FOLLOW UP NOTE  NAME: Brianna Espinoza DOB: 1950-10-25  REASON FOR VISIT: stroke follow up HISTORY FROM: Chart and patient  Today we had the pleasure of seeing Brianna Espinoza in follow-up at our Neurology Clinic. Pt was accompanied by no one.   History summary  Brianna Espinoza is a left handed 64 y.o. female with PMH of osteoporosis who seen on 10/11/14 as a new patient for stroke follow up. Patient stated that about 2 weeks prior she had a sudden onset left face, left arm and left leg numbness and tingling. She denies any weakness, vision changes, slurry speech. She called EMS, and was sent to [facility] where she had a CT head showed no acute changes however MRI showed right thalamus lacunar stroke. Her stroke workup there including carotid Doppler and 2-D echo were unremarkable. Her LDL 110. She was discharged with aspirin and Lipitor. She follow-up with Dr. Glori Bickers and was put on HCTZ for blood pressure control. She was referred here for further evaluation. For the last 2 weeks, patient stated that her symptoms gradually getting better, so far she only had intermittent left leg and left fingertip numbness tingling whenever she is tired. She denies any history of hypertension, diabetes or hyperlipidemia. She denies any history of smoking or illicit drug use. She occasionally drink a glass of wine. Family history including mom had COPD and died of age 41. Father had heart attack at 22 and stroke at 17. Grandma had history of stroke.  Follow up 12/17/14 - pt has been doing well. Her ASA was changed last time and she is on plavix and lipitor now and no side effects. Her left handwriting is much better than before and almost back to baseline. She still has intermittent finger tingling sensation at left hand but not feeling numbness on touch. Her BP at home 120/70s but today in clinic 142/83. She follows with Dr. Alba Cory regularly. Denies s/s of OSA.  Interval  history During the interval time, pt has been doing well. No recurrent stroke like symptoms. No side effects from plavix. However, pt stated that she has to take ibuprofen intermittently for her arthritis. With winter is at the corner, she is asking whether she can switch from plavix to ASA to help for the arthritis so that limit use of ibuprofen. She stated that she was not on ASA or plavix when her stroke occurred. Otherwise, she is doing well and is going to have water therapy for PT. Has PCP follow up next April. BP at home 120s and today 140/85 in clinic.   Past Medical History  Diagnosis Date  . Allergy     allergic rhinitis  . GERD (gastroesophageal reflux disease)   . Hyperlipidemia   . Arthritis     OA  . Obesity   . Fibrocystic breast   . Osteopenia   . Tibia fracture 2012    playing golf   . Stroke [facility])     cerebral infarction   Past Surgical History  Procedure Laterality Date  . Tubal ligation  1976    lap  . Inguinal hernia repair Right 1956  . Breast cyst aspiration  1980s    aspirated breast lump  . Esophagogastroduodenoscopy endoscopy  09/18/14  . Colonoscopy  2012   Family History  Problem Relation Age of Onset  . Arthritis Mother   . Hyperlipidemia Mother   . Hypertension Mother   . COPD Mother   . Asthma Mother   .  Stroke Father   . Heart disease Father   . Pancreatic cancer Maternal Grandfather   . Colon cancer Neg Hx   . Esophageal cancer Neg Hx   . Rectal cancer Neg Hx   . Stomach cancer Neg Hx   . Arthritis Maternal Grandmother   . Hypertension Maternal Grandmother   . Heart disease Maternal Grandfather   . Stroke Maternal Grandmother    Current Outpatient Prescriptions  Medication Sig Dispense Refill  . alendronate (FOSAMAX) 70 MG tablet Take 1 tablet (70 mg total) by mouth every 7 (seven) days. Take with a full glass of water on an empty stomach. 12 tablet 3  . atorvastatin (LIPITOR) 20 MG tablet Take 1 tablet (20 mg total) by mouth daily.  90 tablet 3  . Biotin 5 MG CAPS Take 5 mg by mouth daily.    . Calcium Carbonate-Vit D-Min 600-400 MG-UNIT TABS Take 2 tablets by mouth daily.      . fexofenadine (ALLEGRA) 180 MG tablet Take 180 mg by mouth daily.    . fluticasone (FLONASE) 50 MCG/ACT nasal spray Place 2 sprays into both nostrils daily. 48 g 3  . hydrochlorothiazide (HYDRODIURIL) 25 MG tablet Take 1 tablet (25 mg total) by mouth daily. 30 tablet 5  . Multiple Vitamin (MULTIVITAMIN) tablet Take 1 tablet by mouth daily.      Marland Kitchen omeprazole (PRILOSEC) 20 MG capsule Take 1 capsule (20 mg total) by mouth daily. 90 capsule 3  . aspirin EC 325 MG tablet Take 1 tablet (325 mg total) by mouth daily. 90 tablet 3  . ibuprofen (ADVIL,MOTRIN) 200 MG tablet Take 400 mg by mouth as needed.     No current facility-administered medications for this visit.   Allergies  Allergen Reactions  . Tetanus Toxoid Hives  . Acetaminophen Rash  . Septra [Sulfamethoxazole-Trimethoprim] Rash    Body rash   Social History   Social History  . Marital Status: Married    Spouse Name: N/A  . Number of Children: 1  . Years of Education: 14   Occupational History  . retired    Social History Main Topics  . Smoking status: Never Smoker   . Smokeless tobacco: Never Used  . Alcohol Use: 1.8 oz/week    3 Glasses of wine per week     Comment: occ-wine  . Drug Use: No  . Sexual Activity: Not on file   Other Topics Concern  . Not on file   Social History Narrative   Patient is married with one child.   Patient is left handed.   Patient has 14 yrs of education.   Patient drinks 1 and 1/2 cup daily.    Review of Systems Full 14 system review of systems performed and notable only for those listed, all others are neg:  Constitutional:   Cardiovascular:  Ear/Nose/Throat:   Skin:  Eyes:   Respiratory:   Gastroitestinal:   Genitourinary:  Hematology/Lymphatic:   Endocrine:  Musculoskeletal:   Allergy/Immunology:   Neurological:     Psychiatric:  Sleep:    Physical Exam  Filed Vitals:   06/17/15 0859  BP: 140/85  Pulse: 80    General - Well nourished, well developed, in no apparent distress.  Ophthalmologic - Sharp disc margins OU.  Cardiovascular - Regular rate and rhythm with no murmur. Carotid pulses were 2+ without bruits.   Neck - supple, no nuchal rigidity .  Mental Status -  Level of arousal and orientation to time, place, and person were  intact. Language including expression, naming, repetition, comprehension, reading, and writing was assessed and found intact. Attention span and concentration were normal. Recent and remote memory were intact. Fund of Knowledge was assessed and was intact.  Cranial Nerves II - XII - II - Visual field intact OU. III, IV, VI - Extraocular movements intact. V - Facial sensation intact bilaterally. VII - Facial movement intact bilaterally. VIII - Hearing & vestibular intact bilaterally X - Palate elevates symmetrically. XI - Chin turning & shoulder shrug intact bilaterally. XII - Tongue protrusion intact.  Motor Strength - The patient's strength was normal in all extremities and pronator drift was absent.  Bulk was normal and fasciculations were absent.   Motor Tone - Muscle tone was assessed at the neck and appendages and was normal.  Reflexes - The patient's reflexes were normal in all extremities and she had no pathological reflexes.  Sensory - Light touch, temperature/pinprick, vibration and proprioception, and Romberg testing were assessed and were normal.    Coordination - The patient had normal movements in the hands and feet with no ataxia or dysmetria.  Tremor was absent.  Gait and Station - The patient's transfers, posture, gait, station, and turns were observed as normal.   Imaging MRI head 09/26/2014 1 cm acute ischemic infarct involving the lateral right thalamus. No associated hemorrhage or significant mass effect.  MRA 10/22/14 - This is a  normal noncontrasted MR angiogram of the intracerebral arteries.  CT head 09/26/14 Normal for age noncontrast CT appearance of the brain  Carotid Doppler  minimal amount of bilateral intimal wall thickening, left subjectively greater than right, not resulting in the hemodynamically significant stenosis.  2-D echo - Left ventricular ejection fraction is 70-75% - Normal global left ventricular systolic function - Mild mitral valve regurgitation - Mild tricuspid regurgitation  Lab Review  Component     Latest Ref Rng 10/23/2014 11/16/2014  Cholesterol     0 - 200 mg/dL 140   Triglycerides     0.0 - 149.0 mg/dL 71.0   HDL Cholesterol     >39.00 mg/dL 43.50   VLDL     0.0 - 40.0 mg/dL 14.2   LDL (calc)     0 - 99 mg/dL 82   Total CHOL/HDL Ratio      3   NonHDL      96.50   Hemoglobin A1C     4.6 - 6.5 % 6.0   TSH     0.35 - 4.50 uIU/mL  1.56     Assessment:   In summary, Brianna Espinoza is a 64 y.o. female with PMH of osteoporosis follows up in clinic for right thalamic stroke on MRI. Still has intermittent left hand fingertip tingling. Exam normal. Her stroke risk factor including age, hyperlipidemia, and hypertension. Carotid Doppler and a 2-D echo unremarkable. MRA and A1c normal. Switch aspirin to Plavix. And continued on lipitor. During the interval time, she is doing well. Due to arthritis and intermittent use of ibuprofen, will change plavix to full dose ASA to help arthritis also. She was not on ASA or plavix when her stroke occurred.   Plan: - switch from plavix to ASA 325 for stroke prevention and also arthritis treatment - Continue lipitor for stroke prevention - Check BP at home - Follow up with your primary care physician for stroke risk factor modification. Recommend maintain blood pressure goal <130/80, diabetes with hemoglobin A1c goal below 6.5% and lipids with LDL cholesterol goal below 70 mg/dL. -  RTC in one year  I spent more than 25 minutes of face to  face time with the patient. Greater than 50% of time was spent in counseling and coordination of care. We have discussed about switch medication for arthritis and stroke treatment as well as stroke prevention measures.    No orders of the defined types were placed in this encounter.    Meds ordered this encounter  Medications  . aspirin EC 325 MG tablet    Sig: Take 1 tablet (325 mg total) by mouth daily.    Dispense:  90 tablet    Refill:  3    Patient Instructions  - switch from plavix to full dose ASA for stroke prevention and also arthritis treatment - Continue lipitor for stroke prevention - Check BP at home - Follow up with your primary care physician for stroke risk factor modification. Recommend maintain blood pressure goal <130/80, diabetes with hemoglobin A1c goal below 6.5% and lipids with LDL cholesterol goal below 70 mg/dL.  - follow up in one year.    Rosalin Hawking, MD PhD [facility] [address] [place], [place] [address] [phone]

## 2015-06-21 ENCOUNTER — Ambulatory Visit
Admission: RE | Admit: 2015-06-21 | Discharge: 2015-06-21 | Disposition: A | Discharge status: Home / Self Care | Payer: Medicare Other | Source: Ambulatory Visit | Attending: Family Medicine | Admitting: Family Medicine

## 2015-07-16 ENCOUNTER — Encounter: Payer: Self-pay | Admitting: Family Medicine

## 2015-07-16 MED ORDER — HYDROCHLOROTHIAZIDE 25 MG PO TABS: 25.0000 mg | ORAL_TABLET | Freq: Every day | ORAL | Status: DC

## 2015-07-16 MED ORDER — ATORVASTATIN CALCIUM 20 MG PO TABS: 20.0000 mg | ORAL_TABLET | Freq: Every day | ORAL | Status: DC

## 2015-08-28 ENCOUNTER — Encounter: Payer: Self-pay | Admitting: Family Medicine

## 2015-08-28 ENCOUNTER — Other Ambulatory Visit: Payer: Self-pay | Admitting: Family Medicine

## 2015-08-29 DIAGNOSIS — M9903 Segmental and somatic dysfunction of lumbar region: Secondary | ICD-10-CM | POA: Diagnosis not present

## 2015-08-29 DIAGNOSIS — M9905 Segmental and somatic dysfunction of pelvic region: Secondary | ICD-10-CM | POA: Diagnosis not present

## 2015-08-29 DIAGNOSIS — M955 Acquired deformity of pelvis: Secondary | ICD-10-CM | POA: Diagnosis not present

## 2015-08-29 DIAGNOSIS — M5416 Radiculopathy, lumbar region: Secondary | ICD-10-CM | POA: Diagnosis not present

## 2015-09-11 ENCOUNTER — Ambulatory Visit (INDEPENDENT_AMBULATORY_CARE_PROVIDER_SITE_OTHER): Payer: Medicare HMO | Admitting: Family Medicine

## 2015-09-11 ENCOUNTER — Encounter: Payer: Self-pay | Admitting: Family Medicine

## 2015-09-11 VITALS — BP 138/78 | HR 76 | Temp 98.4°F | Ht 61.0 in | Wt 159.5 lb

## 2015-09-11 DIAGNOSIS — R140 Abdominal distension (gaseous): Secondary | ICD-10-CM

## 2015-09-11 DIAGNOSIS — E785 Hyperlipidemia, unspecified: Secondary | ICD-10-CM

## 2015-09-11 NOTE — Progress Notes (Signed)
Pre visit review using our clinic review tool, if applicable. No additional management support is needed unless otherwise documented below in the visit note. 

## 2015-09-11 NOTE — Progress Notes (Signed)
Subjective:    Patient ID: Brianna Espinoza, female    DOB: [birth year], 65 y.o.   MRN: [id number]  HPI Here with acid reflux symptoms   Started ground flax seed for her cholesterol (intol of statins) She started having a lot of gas and bloating  Took 1 tbsp twice daily   Even woke up with it (sleeping on her stomach)  No n/v or diarrhea and constipation   She eats a lot of fruit and veg   Had some heartburn as well  She does take omeprazole   Tried a tums and it did not help   No flax yesterday or today - now she feels much better   Is going to the gym as well   Used to take fish oil/ omega 3    Patient Active Problem List   Diagnosis Date Noted  . Estrogen deficiency 04/01/2015  . Cerebral infarction due to thrombosis of right middle cerebral artery ([place]) 12/17/2014  . Encounter for Medicare annual wellness exam 11/16/2014  . Hyperglycemia 11/16/2014  . Otitis media, right 11/05/2014  . CVA (cerebral infarction) 10/03/2014  . Essential hypertension 10/03/2014  . Abdominal pain, right upper quadrant 05/07/2014  . Obesity 09/21/2011  . Osteopenia 03/12/2009  . Hyperlipidemia 12/13/2006  . ALLERGIC RHINITIS 12/13/2006  . GERD 12/13/2006  . OSTEOARTHRITIS 12/13/2006   Past Medical History  Diagnosis Date  . Allergy     allergic rhinitis  . GERD (gastroesophageal reflux disease)   . Hyperlipidemia   . Arthritis     OA  . Obesity   . Fibrocystic breast   . Osteopenia   . Tibia fracture 2012    playing golf   . Stroke [facility])     cerebral infarction   Past Surgical History  Procedure Laterality Date  . Tubal ligation  1976    lap  . Inguinal hernia repair Right 1956  . Breast cyst aspiration  1980s    aspirated breast lump  . Esophagogastroduodenoscopy endoscopy  09/18/14  . Colonoscopy  2012   Social History  Substance Use Topics  . Smoking status: Never Smoker   . Smokeless tobacco: Never Used  . Alcohol Use: 1.8 oz/week    3 Glasses of wine per  week     Comment: occ-wine   Family History  Problem Relation Age of Onset  . Arthritis Mother   . Hyperlipidemia Mother   . Hypertension Mother   . COPD Mother   . Asthma Mother   . Stroke Father   . Heart disease Father   . Pancreatic cancer Maternal Grandfather   . Colon cancer Neg Hx   . Esophageal cancer Neg Hx   . Rectal cancer Neg Hx   . Stomach cancer Neg Hx   . Arthritis Maternal Grandmother   . Hypertension Maternal Grandmother   . Heart disease Maternal Grandfather   . Stroke Maternal Grandmother    Allergies  Allergen Reactions  . Lipitor [Atorvastatin] Other (See Comments)    Muscle pain   . Tetanus Toxoid Hives  . Acetaminophen Rash  . Septra [Sulfamethoxazole-Trimethoprim] Rash    Body rash   Current Outpatient Prescriptions on File Prior to Visit  Medication Sig Dispense Refill  . alendronate (FOSAMAX) 70 MG tablet Take 1 tablet (70 mg total) by mouth every 7 (seven) days. Take with a full glass of water on an empty stomach. 12 tablet 3  . aspirin EC 325 MG tablet Take 1 tablet (325 mg total) by  mouth daily. 90 tablet 3  . Calcium Carbonate-Vit D-Min 600-400 MG-UNIT TABS Take 2 tablets by mouth daily.      . fexofenadine (ALLEGRA) 180 MG tablet Take 180 mg by mouth daily.    . fluticasone (FLONASE) 50 MCG/ACT nasal spray Place 2 sprays into both nostrils daily. 48 g 3  . hydrochlorothiazide (HYDRODIURIL) 25 MG tablet Take 1 tablet (25 mg total) by mouth daily. 90 tablet 1  . ibuprofen (ADVIL,MOTRIN) 200 MG tablet Take 400 mg by mouth as needed.    . Multiple Vitamin (MULTIVITAMIN) tablet Take 1 tablet by mouth daily.      Marland Kitchen omeprazole (PRILOSEC) 20 MG capsule Take 1 capsule (20 mg total) by mouth daily. 90 capsule 3   No current facility-administered medications on file prior to visit.     Review of Systems    Review of Systems  Constitutional: Negative for fever, appetite change, fatigue and unexpected weight change.  Eyes: Negative for pain and  visual disturbance.  Respiratory: Negative for cough and shortness of breath.   Cardiovascular: Negative for cp or palpitations    Gastrointestinal: Negative for nausea, diarrhea and constipation. pos for bloating and gas -now improved , neg for heartburn now  Genitourinary: Negative for urgency and frequency.  Skin: Negative for pallor or rash   Neurological: Negative for weakness, light-headedness, numbness and headaches.  Hematological: Negative for adenopathy. Does not bruise/bleed easily.  Psychiatric/Behavioral: Negative for dysphoric mood. The patient is not nervous/anxious.      Objective:   Physical Exam  Constitutional: She appears well-developed and well-nourished. No distress.  obese and well appearing   HENT:  Head: Normocephalic and atraumatic.  Mouth/Throat: Oropharynx is clear and moist.  Eyes: Conjunctivae and EOM are normal. Pupils are equal, round, and reactive to light. No scleral icterus.  Neck: Normal range of motion. Neck supple.  Cardiovascular: Normal rate, regular rhythm and normal heart sounds.   Pulmonary/Chest: Effort normal and breath sounds normal. No respiratory distress. She has no wheezes. She has no rales.  Abdominal: Soft. Bowel sounds are normal. She exhibits no distension, no abdominal bruit, no pulsatile midline mass and no mass. There is no hepatosplenomegaly. There is no tenderness. There is no rigidity, no rebound, no guarding, no CVA tenderness, no tenderness at McBurney's point and negative Murphy's sign.  Lymphadenopathy:    She has no cervical adenopathy.  Neurological: She is alert.  Skin: Skin is warm and dry. No erythema. No pallor.  Psychiatric: She has a normal mood and affect.          Assessment & Plan:   Problem List Items Addressed This Visit      Other   Bloating symptom - Primary    Suspect due to trial of flax seed (for choleserol) in large amts---symptoms resolved off of it  I think the large amount of flax seed  caused you bloating and gas Take a break from it this week  The try 1/2 Tbsp once daily- see how you do and if you do well - you can increase it gradually  If it does not work out-go back to fish oil      Hyperlipidemia    Intol of statins Tried flax seed which caused bloating- may try again at smaller amounts  Also fish oil is an opt Rev low sat fat diet

## 2015-09-11 NOTE — Patient Instructions (Signed)
I think the large amount of flax seed caused you bloating and gas Take a break from it this week  The try 1/2 Tbsp once daily- see how you do and if you do well - you can increase it gradually  If it does not work out-go back to fish oil Avoid red meat/ fried foods/ egg yolks/ fatty breakfast meats/ butter, cheese and high fat dairy/ and shellfish   Keep up the work with exercise

## 2015-09-12 NOTE — Assessment & Plan Note (Signed)
Intol of statins Tried flax seed which caused bloating- may try again at smaller amounts  Also fish oil is an opt Rev low sat fat diet

## 2015-09-12 NOTE — Assessment & Plan Note (Signed)
Suspect due to trial of flax seed (for choleserol) in large amts---symptoms resolved off of it  I think the large amount of flax seed caused you bloating and gas Take a break from it this week  The try 1/2 Tbsp once daily- see how you do and if you do well - you can increase it gradually  If it does not work out-go back to fish oil

## 2015-09-23 ENCOUNTER — Telehealth: Payer: Self-pay | Admitting: Family Medicine

## 2015-09-23 MED ORDER — OSELTAMIVIR PHOSPHATE 75 MG PO CAPS: 75.0000 mg | ORAL_CAPSULE | Freq: Every day | ORAL | Status: DC

## 2015-09-23 NOTE — Telephone Encounter (Signed)
I sent it in  One pill daily for 7 days  Try to quarantine him and if possible wear a mask

## 2015-09-23 NOTE — Telephone Encounter (Signed)
Left voicemail letting pt know Rx sent and advise of Dr. Marliss Coots comments

## 2015-09-23 NOTE — Telephone Encounter (Signed)
Pt called, husband diagnosed with influenza this am at [facility] in [place].  She would like prescription for Tamiflu called in to [facility] in [place] and a callback when complete to [phone] / lt

## 2015-09-26 DIAGNOSIS — M955 Acquired deformity of pelvis: Secondary | ICD-10-CM | POA: Diagnosis not present

## 2015-09-26 DIAGNOSIS — M5416 Radiculopathy, lumbar region: Secondary | ICD-10-CM | POA: Diagnosis not present

## 2015-09-26 DIAGNOSIS — M9905 Segmental and somatic dysfunction of pelvic region: Secondary | ICD-10-CM | POA: Diagnosis not present

## 2015-09-26 DIAGNOSIS — M9903 Segmental and somatic dysfunction of lumbar region: Secondary | ICD-10-CM | POA: Diagnosis not present

## 2015-10-16 DIAGNOSIS — M189 Osteoarthritis of first carpometacarpal joint, unspecified: Secondary | ICD-10-CM | POA: Diagnosis not present

## 2015-10-31 DIAGNOSIS — M9905 Segmental and somatic dysfunction of pelvic region: Secondary | ICD-10-CM | POA: Diagnosis not present

## 2015-10-31 DIAGNOSIS — M9903 Segmental and somatic dysfunction of lumbar region: Secondary | ICD-10-CM | POA: Diagnosis not present

## 2015-10-31 DIAGNOSIS — M5416 Radiculopathy, lumbar region: Secondary | ICD-10-CM | POA: Diagnosis not present

## 2015-10-31 DIAGNOSIS — M955 Acquired deformity of pelvis: Secondary | ICD-10-CM | POA: Diagnosis not present

## 2015-11-09 ENCOUNTER — Telehealth: Payer: Self-pay | Admitting: Family Medicine

## 2015-11-09 DIAGNOSIS — R739 Hyperglycemia, unspecified: Secondary | ICD-10-CM

## 2015-11-09 DIAGNOSIS — E785 Hyperlipidemia, unspecified: Secondary | ICD-10-CM

## 2015-11-09 DIAGNOSIS — I10 Essential (primary) hypertension: Secondary | ICD-10-CM

## 2015-11-09 NOTE — Telephone Encounter (Signed)
-----   Message from Marchia Bond sent at 11/07/2015  3:59 PM EDT ----- Regarding: Cpx labs [place] 4/13, need orders. Thanks! :-) Please order  future cpx labs for pt's upcoming lab appt. Thanks Aniceto Boss

## 2015-11-14 ENCOUNTER — Other Ambulatory Visit (INDEPENDENT_AMBULATORY_CARE_PROVIDER_SITE_OTHER): Payer: Medicare HMO

## 2015-11-14 DIAGNOSIS — I10 Essential (primary) hypertension: Secondary | ICD-10-CM

## 2015-11-14 DIAGNOSIS — E785 Hyperlipidemia, unspecified: Secondary | ICD-10-CM

## 2015-11-14 DIAGNOSIS — R739 Hyperglycemia, unspecified: Secondary | ICD-10-CM

## 2015-11-14 LAB — CBC WITH DIFFERENTIAL/PLATELET
BASOS ABS: 0.1 10*3/uL (ref 0.0–0.1)
Basophils Relative: 0.9 % (ref 0.0–3.0)
EOS ABS: 0.2 10*3/uL (ref 0.0–0.7)
Eosinophils Relative: 2.8 % (ref 0.0–5.0)
HCT: 40.9 % (ref 36.0–46.0)
HEMOGLOBIN: 13.9 g/dL (ref 12.0–15.0)
Lymphocytes Relative: 29.1 % (ref 12.0–46.0)
Lymphs Abs: 2.5 10*3/uL (ref 0.7–4.0)
MCHC: 34 g/dL (ref 30.0–36.0)
MCV: 90.3 fl (ref 78.0–100.0)
MONO ABS: 0.6 10*3/uL (ref 0.1–1.0)
Monocytes Relative: 7 % (ref 3.0–12.0)
Neutro Abs: 5.2 10*3/uL (ref 1.4–7.7)
Neutrophils Relative %: 60.2 % (ref 43.0–77.0)
Platelets: 299 10*3/uL (ref 150.0–400.0)
RBC: 4.52 Mil/uL (ref 3.87–5.11)
RDW: 13.3 % (ref 11.5–15.5)
WBC: 8.6 10*3/uL (ref 4.0–10.5)

## 2015-11-14 LAB — LIPID PANEL
CHOL/HDL RATIO: 4
Cholesterol: 196 mg/dL (ref 0–200)
HDL: 47.1 mg/dL (ref >39.00)
LDL Cholesterol: 133 mg/dL — ABNORMAL HIGH (ref 0–99)
NONHDL: 149.17
Triglycerides: 79 mg/dL (ref 0.0–149.0)
VLDL: 15.8 mg/dL (ref 0.0–40.0)

## 2015-11-14 LAB — COMPREHENSIVE METABOLIC PANEL
ALBUMIN: 4 g/dL (ref 3.5–5.2)
ALK PHOS: 62 U/L (ref 39–117)
ALT: 16 U/L (ref 0–35)
AST: 24 U/L (ref 0–37)
BUN: 16 mg/dL (ref 6–23)
CO2: 33 mEq/L — ABNORMAL HIGH (ref 19–32)
CREATININE: 0.91 mg/dL (ref 0.40–1.20)
Calcium: 10 mg/dL (ref 8.4–10.5)
Chloride: 100 mEq/L (ref 96–112)
GFR: 65.51 mL/min (ref >60.00)
GLUCOSE: 94 mg/dL (ref 70–99)
Potassium: 3.8 mEq/L (ref 3.5–5.1)
SODIUM: 140 meq/L (ref 135–145)
TOTAL PROTEIN: 7.3 g/dL (ref 6.0–8.3)
Total Bilirubin: 0.4 mg/dL (ref 0.2–1.2)

## 2015-11-14 LAB — TSH: TSH: 1.33 u[IU]/mL (ref 0.35–4.50)

## 2015-11-14 LAB — HEMOGLOBIN A1C: HEMOGLOBIN A1C: 6 % (ref 4.6–6.5)

## 2015-11-15 ENCOUNTER — Other Ambulatory Visit: Payer: Medicare Other

## 2015-11-19 ENCOUNTER — Encounter: Payer: Self-pay | Admitting: Family Medicine

## 2015-11-19 ENCOUNTER — Ambulatory Visit (INDEPENDENT_AMBULATORY_CARE_PROVIDER_SITE_OTHER): Payer: Medicare HMO | Admitting: Family Medicine

## 2015-11-19 VITALS — BP 130/82 | HR 75 | Temp 98.4°F | Ht 61.0 in | Wt 160.2 lb

## 2015-11-19 DIAGNOSIS — M8580 Other specified disorders of bone density and structure, unspecified site: Secondary | ICD-10-CM

## 2015-11-19 DIAGNOSIS — I10 Essential (primary) hypertension: Secondary | ICD-10-CM | POA: Diagnosis not present

## 2015-11-19 DIAGNOSIS — E785 Hyperlipidemia, unspecified: Secondary | ICD-10-CM

## 2015-11-19 DIAGNOSIS — E669 Obesity, unspecified: Secondary | ICD-10-CM | POA: Diagnosis not present

## 2015-11-19 DIAGNOSIS — Z0000 Encounter for general adult medical examination without abnormal findings: Secondary | ICD-10-CM

## 2015-11-19 DIAGNOSIS — Z23 Encounter for immunization: Secondary | ICD-10-CM | POA: Diagnosis not present

## 2015-11-19 MED ORDER — HYDROCHLOROTHIAZIDE 25 MG PO TABS: 25.0000 mg | ORAL_TABLET | Freq: Every day | ORAL | Status: DC

## 2015-11-19 MED ORDER — OMEPRAZOLE 20 MG PO CPDR: 20.0000 mg | DELAYED_RELEASE_CAPSULE | ORAL | Status: DC

## 2015-11-19 NOTE — Patient Instructions (Addendum)
Pneumonia vaccine today  Stop the fosamax-you are done with it  Cholesterol is up - keep watching saturated fats in diet  Keep exercising

## 2015-11-19 NOTE — Progress Notes (Signed)
Subjective:    Patient ID: Brianna Espinoza, female    DOB: October 09, 1950, 65 y.o.   MRN: [id number]  HPI Here for annual medicare wellness visit as well as chronic/acute medical problems as well as annual preventative exam  I have personally reviewed the Medicare Annual Wellness questionnaire and have noted 1. The patient's medical and social history 2. Their use of alcohol, tobacco or illicit drugs 3. Their current medications and supplements 4. The patient's functional ability including ADL's, fall risks, home safety risks and hearing or visual             impairment. 5. Diet and physical activities 6. Evidence for depression or mood disorders  The patients weight, height, BMI have been recorded in the chart and visual acuity is per eye clinic.  I have made referrals, counseling and provided education to the patient based review of the above and I have provided the pt with a written personalized care plan for preventive services. Reviewed and updated provider list, see scanned forms.  See scanned forms.  Routine anticipatory guidance given to patient.  See health maintenance. Colon cancer screening 11/12 - normal / 10 year recall  Breast cancer screening 10/16 normal  Self breast exam- no lumps  Flu vaccine 10/16  Tetanus vaccine 2/13  Pneumovax- due for the pna 23  Zoster vaccine 7/15 dexa -11/16 stable with osteopenia  On fosamax- for 6 years - is ok to stop it  No falls or fx Takes ca and D Exercises regularly/the gym - feels stronger  Advance directive has a living will and poa  Cognitive function addressed- see scanned forms- and if abnormal then additional documentation follows. No memory problems at all - stays very mentally active   PMH and SH reviewed  Meds, vitals, and allergies reviewed.   ROS: See HPI.  Otherwise negative.    Hyperglycemia Stable with  Lab Results  Component Value Date   HGBA1C 6.0 11/14/2015   Well controlled-still eating very  healthy  bp is stable today  No cp or palpitations or headaches or edema  No side effects to medicines  BP Readings from Last 3 Encounters:  11/19/15 130/82  09/11/15 138/78  06/17/15 140/85     Wt is stable with bmi of 30 Obese- continues good diet and exercise    Cholesterol Lab Results  Component Value Date   CHOL 196 11/14/2015   CHOL 140 10/23/2014   CHOL 195 11/09/2013   Lab Results  Component Value Date   HDL 47.10 11/14/2015   HDL 43.50 10/23/2014   HDL 51.20 11/09/2013   Lab Results  Component Value Date   LDLCALC 133* 11/14/2015   LDLCALC 82 10/23/2014   LDLCALC 128* 11/09/2013   Lab Results  Component Value Date   TRIG 79.0 11/14/2015   TRIG 71.0 10/23/2014   TRIG 79.0 11/09/2013   Lab Results  Component Value Date   CHOLHDL 4 11/14/2015   CHOLHDL 3 10/23/2014   CHOLHDL 4 11/09/2013   No results found for: LDLDIRECT LDL - up from 81 to 133 - does not eat red meat at all  A lot of vegetables Was lower on lipitor - had severe muscle pain with it  -tried it several times   Now on fish oil- tolerates well 2 a day   Patient Active Problem List   Diagnosis Date Noted  . Routine general medical examination at a health care facility 11/19/2015  . Bloating symptom 09/11/2015  . Estrogen deficiency  04/01/2015  . Cerebral infarction due to thrombosis of right middle cerebral artery ([place]) 12/17/2014  . Encounter for Medicare annual wellness exam 11/16/2014  . Hyperglycemia 11/16/2014  . Otitis media, right 11/05/2014  . CVA (cerebral infarction) 10/03/2014  . Essential hypertension 10/03/2014  . Abdominal pain, right upper quadrant 05/07/2014  . Obesity 09/21/2011  . Osteopenia 03/12/2009  . Hyperlipidemia 12/13/2006  . ALLERGIC RHINITIS 12/13/2006  . GERD 12/13/2006  . OSTEOARTHRITIS 12/13/2006   Past Medical History  Diagnosis Date  . Allergy     allergic rhinitis  . GERD (gastroesophageal reflux disease)   . Hyperlipidemia   . Arthritis      OA  . Obesity   . Fibrocystic breast   . Osteopenia   . Tibia fracture 2012    playing golf   . Stroke [facility])     cerebral infarction   Past Surgical History  Procedure Laterality Date  . Tubal ligation  1976    lap  . Inguinal hernia repair Right 1956  . Breast cyst aspiration  1980s    aspirated breast lump  . Esophagogastroduodenoscopy endoscopy  09/18/14  . Colonoscopy  2012   Social History  Substance Use Topics  . Smoking status: Never Smoker   . Smokeless tobacco: Never Used  . Alcohol Use: 1.8 oz/week    3 Glasses of wine per week     Comment: occ-wine   Family History  Problem Relation Age of Onset  . Arthritis Mother   . Hyperlipidemia Mother   . Hypertension Mother   . COPD Mother   . Asthma Mother   . Stroke Father   . Heart disease Father   . Pancreatic cancer Maternal Grandfather   . Colon cancer Neg Hx   . Esophageal cancer Neg Hx   . Rectal cancer Neg Hx   . Stomach cancer Neg Hx   . Arthritis Maternal Grandmother   . Hypertension Maternal Grandmother   . Heart disease Maternal Grandfather   . Stroke Maternal Grandmother    Allergies  Allergen Reactions  . Lipitor [Atorvastatin] Other (See Comments)    Muscle pain   . Tetanus Toxoid Hives  . Acetaminophen Rash  . Septra [Sulfamethoxazole-Trimethoprim] Rash    Body rash   Current Outpatient Prescriptions on File Prior to Visit  Medication Sig Dispense Refill  . alendronate (FOSAMAX) 70 MG tablet Take 1 tablet (70 mg total) by mouth every 7 (seven) days. Take with a full glass of water on an empty stomach. 12 tablet 3  . aspirin EC 325 MG tablet Take 1 tablet (325 mg total) by mouth daily. 90 tablet 3  . Calcium Carbonate-Vit D-Min 600-400 MG-UNIT TABS Take 2 tablets by mouth daily.      . fexofenadine (ALLEGRA) 180 MG tablet Take 180 mg by mouth daily.    . Flaxseed, Linseed, (FLAX SEEDS PO) Take by mouth daily.    . hydrochlorothiazide (HYDRODIURIL) 25 MG tablet Take 1 tablet (25 mg  total) by mouth daily. 90 tablet 1  . ibuprofen (ADVIL,MOTRIN) 200 MG tablet Take 400 mg by mouth as needed.    . Multiple Vitamin (MULTIVITAMIN) tablet Take 1 tablet by mouth daily.       No current facility-administered medications on file prior to visit.      Review of Systems Review of Systems  Constitutional: Negative for fever, appetite change, fatigue and unexpected weight change.  Eyes: Negative for pain and visual disturbance.  Respiratory: Negative for cough and shortness of  breath.   Cardiovascular: Negative for cp or palpitations    Gastrointestinal: Negative for nausea, diarrhea and constipation.  Genitourinary: Negative for urgency and frequency.  Skin: Negative for pallor or rash   Neurological: Negative for weakness, light-headedness, numbness and headaches.  Hematological: Negative for adenopathy. Does not bruise/bleed easily.  Psychiatric/Behavioral: Negative for dysphoric mood. The patient is not nervous/anxious.         Objective:   Physical Exam  Constitutional: She appears well-developed and well-nourished. No distress.  obese and well appearing   HENT:  Head: Normocephalic and atraumatic.  Right Ear: External ear normal.  Left Ear: External ear normal.  Mouth/Throat: Oropharynx is clear and moist.  Eyes: Conjunctivae and EOM are normal. Pupils are equal, round, and reactive to light. No scleral icterus.  Neck: Normal range of motion. Neck supple. No JVD present. Carotid bruit is not present. No thyromegaly present.  Cardiovascular: Normal rate, regular rhythm, normal heart sounds and intact distal pulses.  Exam reveals no gallop.   Pulmonary/Chest: Effort normal and breath sounds normal. No respiratory distress. She has no wheezes. She exhibits no tenderness.  Abdominal: Soft. Bowel sounds are normal. She exhibits no distension, no abdominal bruit and no mass. There is no tenderness.  Genitourinary: No breast swelling, tenderness, discharge or bleeding.   Breast exam: No mass, nodules, thickening, tenderness, bulging, retraction, inflamation, nipple discharge or skin changes noted.  No axillary or clavicular LA.      Musculoskeletal: Normal range of motion. She exhibits no edema or tenderness.  Lymphadenopathy:    She has no cervical adenopathy.  Neurological: She is alert. She has normal reflexes. No cranial nerve deficit. She exhibits normal muscle tone. Coordination normal.  Skin: Skin is warm and dry. No rash noted. No erythema. No pallor.  Diffuse solar lentigines  Psychiatric: She has a normal mood and affect.          Assessment & Plan:   Problem List Items Addressed This Visit      Cardiovascular and Mediastinum   Essential hypertension - Primary    bp in fair control at this time  BP Readings from Last 1 Encounters:  11/19/15 130/82   No changes needed Disc lifstyle change with low sodium diet and exercise  Labs reviewed       Relevant Medications   hydrochlorothiazide (HYDRODIURIL) 25 MG tablet     Musculoskeletal and Integument   Osteopenia    dexa stable 11/16 Can stop fosamax-has been 6 y (max benefit) Disc need for calcium/ vitamin D/ wt bearing exercise and bone density test every 2 y to monitor Disc safety/ fracture risk in detail           Other   Encounter for Medicare annual wellness exam       Reviewed health habits including diet and exercise and skin cancer prevention Reviewed appropriate screening tests for age  Also reviewed health mt list, fam hx and immunization status , as well as social and family history   See HPI Labs reviewed  Urged to continue good habits pna 23 today      Hyperlipidemia    Intol of statin  LDL up to 133-disc low sat fat diet Continue fish oil and exercise       Relevant Medications   hydrochlorothiazide (HYDRODIURIL) 25 MG tablet   Obesity    Discussed how this problem influences overall health and the risks it imposes  Reviewed plan for weight loss  with lower calorie diet (via  better food choices and also portion control or program like weight watchers) and exercise building up to or more than 30 minutes 5 days per week including some aerobic activity   Pt is working hard on healthy diet and exercise       Routine general medical examination at a health care facility    Reviewed health habits including diet and exercise and skin cancer prevention Reviewed appropriate screening tests for age  Also reviewed health mt list, fam hx and immunization status , as well as social and family history   See HPI Labs reviewed  Urged to continue good habits pna 23 today       Other Visit Diagnoses    Need for 23-polyvalent pneumococcal polysaccharide vaccine        Relevant Orders    Pneumococcal polysaccharide vaccine 23-valent greater than or equal to 2yo subcutaneous/IM (Completed)

## 2015-11-19 NOTE — Assessment & Plan Note (Signed)
Discussed how this problem influences overall health and the risks it imposes  Reviewed plan for weight loss with lower calorie diet (via better food choices and also portion control or program like weight watchers) and exercise building up to or more than 30 minutes 5 days per week including some aerobic activity   Pt is working hard on healthy diet and exercise

## 2015-11-19 NOTE — Assessment & Plan Note (Signed)
Reviewed health habits including diet and exercise and skin cancer prevention Reviewed appropriate screening tests for age  Also reviewed health mt list, fam hx and immunization status , as well as social and family history   See HPI Labs reviewed  Urged to continue good habits pna 23 today

## 2015-11-19 NOTE — Assessment & Plan Note (Signed)
dexa stable 11/16 Can stop fosamax-has been 6 y (max benefit) Disc need for calcium/ vitamin D/ wt bearing exercise and bone density test every 2 y to monitor Disc safety/ fracture risk in detail

## 2015-11-19 NOTE — Progress Notes (Signed)
Pre visit review using our clinic review tool, if applicable. No additional management support is needed unless otherwise documented below in the visit note. 

## 2015-11-19 NOTE — Assessment & Plan Note (Signed)
Intol of statin  LDL up to 133-disc low sat fat diet Continue fish oil and exercise

## 2015-11-19 NOTE — Assessment & Plan Note (Signed)
  Reviewed health habits including diet and exercise and skin cancer prevention Reviewed appropriate screening tests for age  Also reviewed health mt list, fam hx and immunization status , as well as social and family history   See HPI Labs reviewed  Urged to continue good habits pna 23 today

## 2015-11-19 NOTE — Assessment & Plan Note (Signed)
bp in fair control at this time  BP Readings from Last 1 Encounters:  11/19/15 130/82   No changes needed Disc lifstyle change with low sodium diet and exercise  Labs reviewed

## 2015-12-13 ENCOUNTER — Encounter: Payer: Self-pay | Admitting: Family Medicine

## 2015-12-13 ENCOUNTER — Ambulatory Visit (INDEPENDENT_AMBULATORY_CARE_PROVIDER_SITE_OTHER): Payer: Medicare HMO | Admitting: Family Medicine

## 2015-12-13 VITALS — BP 136/84 | HR 92 | Temp 98.1°F | Ht 61.0 in | Wt 156.8 lb

## 2015-12-13 DIAGNOSIS — F4321 Adjustment disorder with depressed mood: Secondary | ICD-10-CM | POA: Diagnosis not present

## 2015-12-13 DIAGNOSIS — R51 Headache: Secondary | ICD-10-CM

## 2015-12-13 DIAGNOSIS — R519 Headache, unspecified: Secondary | ICD-10-CM | POA: Insufficient documentation

## 2015-12-13 NOTE — Progress Notes (Signed)
Patient ID: Brianna Espinoza, female   DOB: 1950/10/09, 65 y.o.   MRN: [id number]  Tommi Rumps, MD Phone: [phone]  Brianna Espinoza is a 65 y.o. female who presents today for same-day visit.  Grief: Patient notes her son died several weeks ago. She's been dealing with this the best she can. Last night was the first night she slept all night. She has a great support team around her. She's been able to talk to her pastor in a number of friends and her husband. No SI. Plans to grief counseling at some point in the future.  Right facial pain: Patient notes since Tuesday her right maxillary sinus and right ear and right temple has had an achy sensation. It is intermittent. Some congestion though not much. Clear mucus out of her nose. No fevers. Notes her eyes were red and watery with a film several days ago. No vision changes. No numbness. No weakness. No chest pain. No shortness of breath. No tenderness over her temples. Has been using Allegra and nasal saline spray.  PMH: nonsmoker   ROS see history of present illness  Objective  Physical Exam Filed Vitals:   12/13/15 1358  BP: 136/84  Pulse: 92  Temp: 98.1 F (36.7 C)    BP Readings from Last 3 Encounters:  12/13/15 136/84  11/19/15 130/82  09/11/15 138/78   Wt Readings from Last 3 Encounters:  12/13/15 156 lb 12.8 oz (71.124 kg)  11/19/15 160 lb 4 oz (72.689 kg)  09/11/15 159 lb 8 oz (72.349 kg)    Physical Exam  Constitutional: She is well-developed, well-nourished, and in no distress.  HENT:  Head: Normocephalic and atraumatic.  Right Ear: External ear normal.  Left Ear: External ear normal.  Mouth/Throat: Oropharynx is clear and moist. No oropharyngeal exudate.  Normal TM on the right, left TM partially obscured by wax and wax unable to be removed with irrigation, the part of the left hand that was visualized appears normal, bilateral temples are nontender with no palpable vascularity, no tooth tenderness, no  gingival swelling, no maxillary tenderness, no frontal sinus tenderness, no jaw tenderness  Eyes: Conjunctivae are normal. Pupils are equal, round, and reactive to light.  Neck: Neck supple.  Cardiovascular: Normal rate, regular rhythm and normal heart sounds.   Pulmonary/Chest: Effort normal and breath sounds normal.  Lymphadenopathy:    She has no cervical adenopathy.  Neurological: She is alert. Gait normal.  CN 2-12 intact, 5/5 strength in bilateral biceps, triceps, grip, quads, hamstrings, plantar and dorsiflexion, sensation to light touch intact in bilateral UE and LE, normal gait, 2+ patellar reflexes  Skin: Skin is warm and dry. She is not diaphoretic.     Assessment/Plan: Please see individual problem list.  Grief reaction Related to recent death of her son. She has a good support system in place. Discussed continuing to lean on that support system. When she is ready she will seek grief counseling.  Right facial pain Suspect this is likely related to allergies or viral illness or tension type headaches. She is neurologically intact. She has a benign ENT exam. No tenderness over the temples. No obvious dental source. Discussed treatment for allergies and viral illness with changing to Claritin from Allegra, using steroid nasal spray, and continuing saline nasal spray. Discussed possible use of prednisone if this is related to a viral illness or allergies though she declined this. Discussed relaxation techniques. She will continue to monitor. She is given return precautions.    Randall Hiss  Caryl Bis, MD [place]

## 2015-12-13 NOTE — Patient Instructions (Signed)
Nice to meet you. Your symptoms are either related to allergies, viral illness, or tension headaches. You should continue the nasal saline and consider switching to Claritin from Allegra. She should also start on this steroid nasal spray.  if you develop worsening or persistent headache, sudden onset headache, tenderness in her temples, numbness, weakness, facial droop, speech changes, vision changes, or any new or changing symptoms please seek medical attention.

## 2015-12-13 NOTE — Assessment & Plan Note (Signed)
Related to recent death of her son. She has a good support system in place. Discussed continuing to lean on that support system. When she is ready she will seek grief counseling.

## 2015-12-13 NOTE — Assessment & Plan Note (Addendum)
Suspect this is likely related to allergies or viral illness or tension type headaches. She is neurologically intact. She has a benign ENT exam. No tenderness over the temples. No obvious dental source. Discussed treatment for allergies and viral illness with changing to Claritin from Allegra, using steroid nasal spray, and continuing saline nasal spray. Discussed possible use of prednisone if this is related to a viral illness or allergies though she declined this. Discussed relaxation techniques. She will continue to monitor. She is given return precautions.

## 2015-12-13 NOTE — Progress Notes (Signed)
Pre visit review using our clinic review tool, if applicable. No additional management support is needed unless otherwise documented below in the visit note. 

## 2016-01-02 DIAGNOSIS — M9903 Segmental and somatic dysfunction of lumbar region: Secondary | ICD-10-CM | POA: Diagnosis not present

## 2016-01-02 DIAGNOSIS — M5416 Radiculopathy, lumbar region: Secondary | ICD-10-CM | POA: Diagnosis not present

## 2016-01-02 DIAGNOSIS — M955 Acquired deformity of pelvis: Secondary | ICD-10-CM | POA: Diagnosis not present

## 2016-01-02 DIAGNOSIS — M9905 Segmental and somatic dysfunction of pelvic region: Secondary | ICD-10-CM | POA: Diagnosis not present

## 2016-01-30 DIAGNOSIS — M9903 Segmental and somatic dysfunction of lumbar region: Secondary | ICD-10-CM | POA: Diagnosis not present

## 2016-01-30 DIAGNOSIS — M9905 Segmental and somatic dysfunction of pelvic region: Secondary | ICD-10-CM | POA: Diagnosis not present

## 2016-01-30 DIAGNOSIS — M955 Acquired deformity of pelvis: Secondary | ICD-10-CM | POA: Diagnosis not present

## 2016-01-30 DIAGNOSIS — M5416 Radiculopathy, lumbar region: Secondary | ICD-10-CM | POA: Diagnosis not present

## 2016-03-05 DIAGNOSIS — M9903 Segmental and somatic dysfunction of lumbar region: Secondary | ICD-10-CM | POA: Diagnosis not present

## 2016-03-05 DIAGNOSIS — M5416 Radiculopathy, lumbar region: Secondary | ICD-10-CM | POA: Diagnosis not present

## 2016-03-05 DIAGNOSIS — M955 Acquired deformity of pelvis: Secondary | ICD-10-CM | POA: Diagnosis not present

## 2016-03-05 DIAGNOSIS — M9905 Segmental and somatic dysfunction of pelvic region: Secondary | ICD-10-CM | POA: Diagnosis not present

## 2016-03-27 IMAGING — US US ABDOMEN COMPLETE
1 series · 13 of 25 positions shown · non-contrast
Comparison: July 19, 2008

CLINICAL DATA: Intermittent right upper quadrant pain for 7 weeks

EXAM:
ULTRASOUND ABDOMEN COMPLETE

[Series 1: us abdomen complete · 0.24mm/px · 13 of 87 slices shown]
[im 1/87]
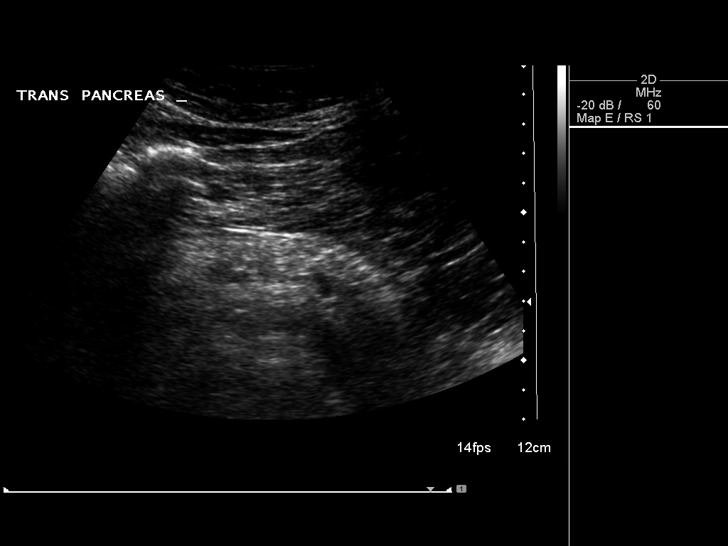
[im 8/87]
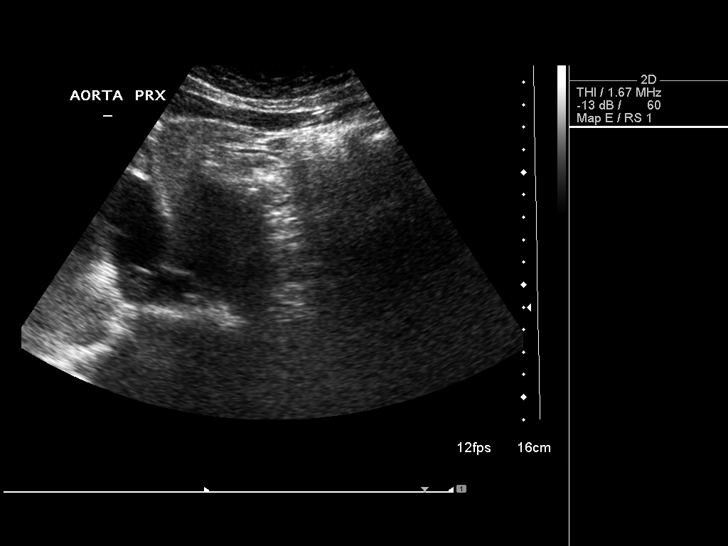
[im 15/87]
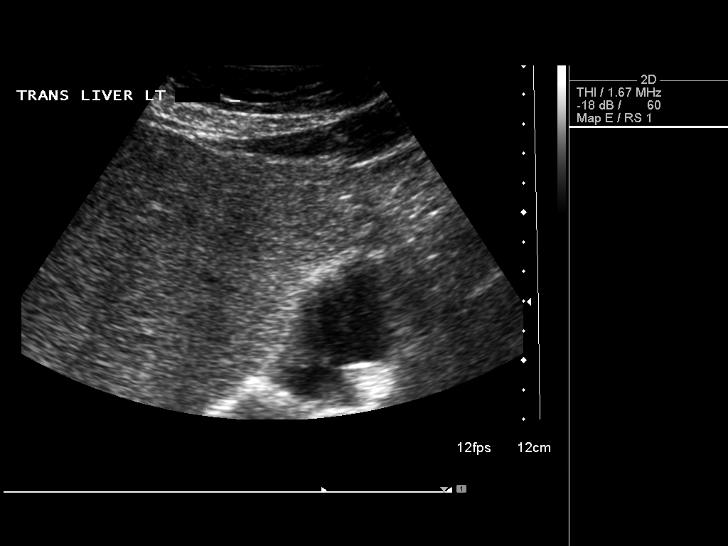
[im 22/87]
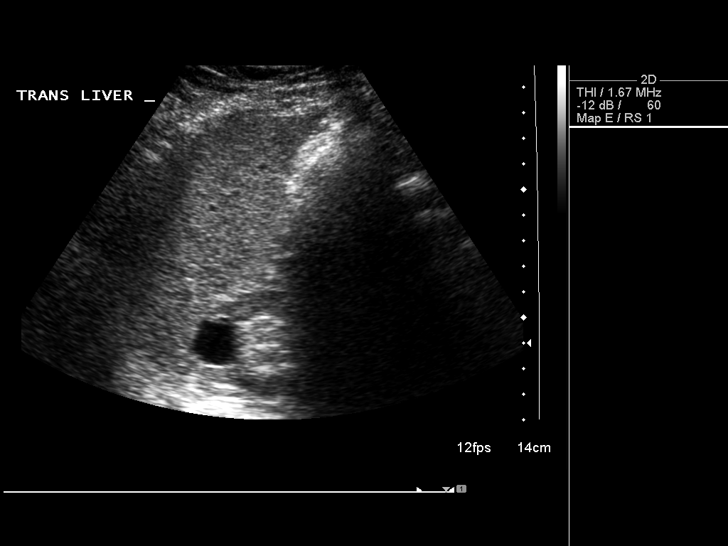
[im 29/87]
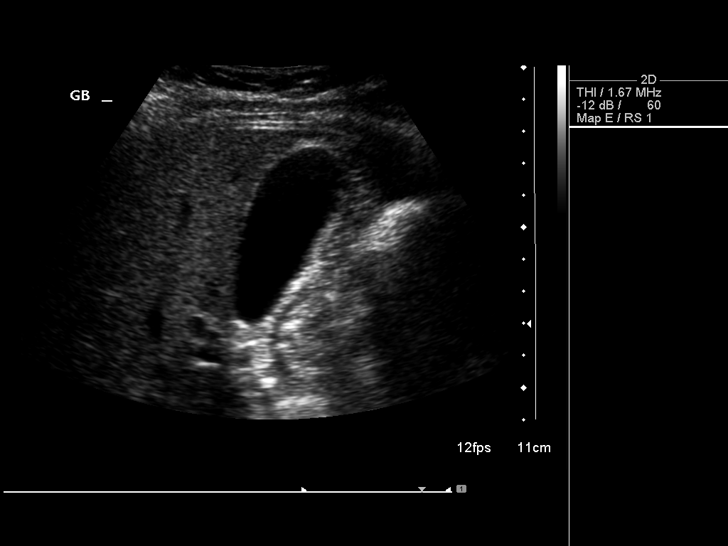
[im 36/87]
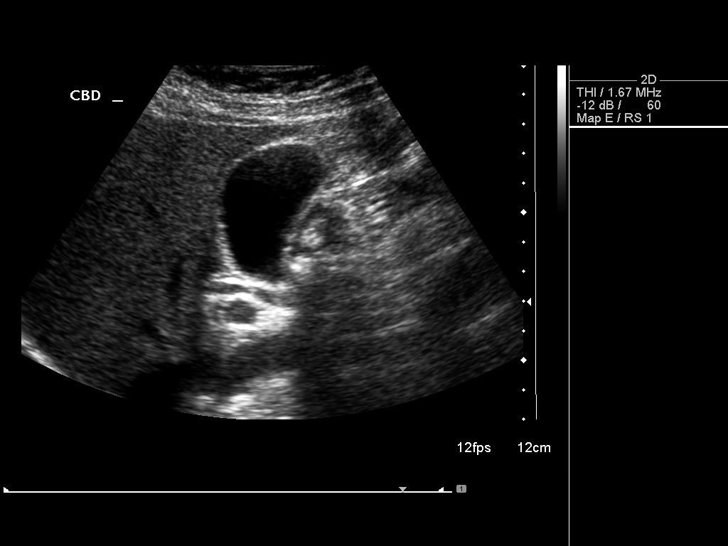
[im 44/87]
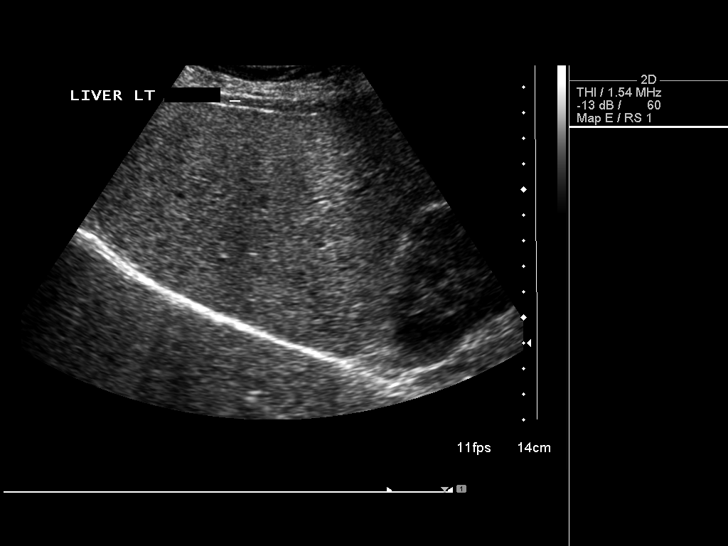
[im 51/87]
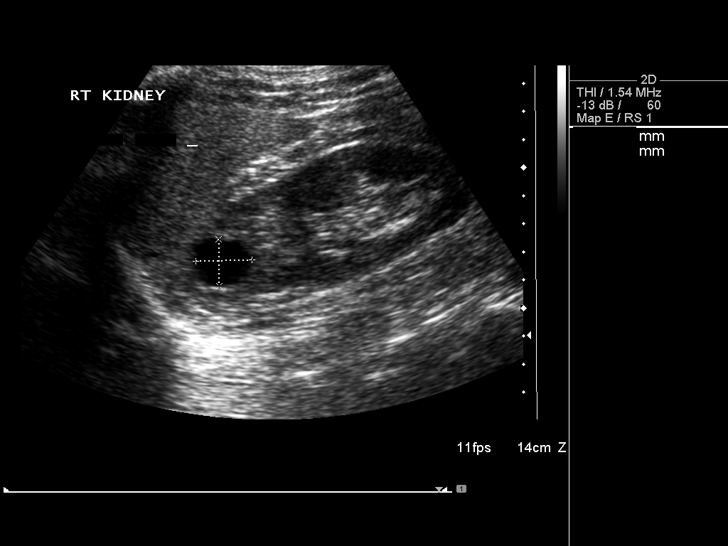
[im 58/87]
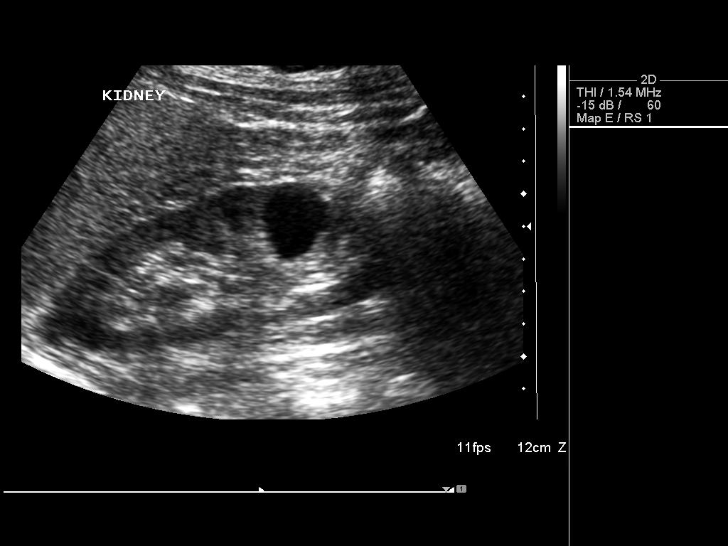
[im 65/87]
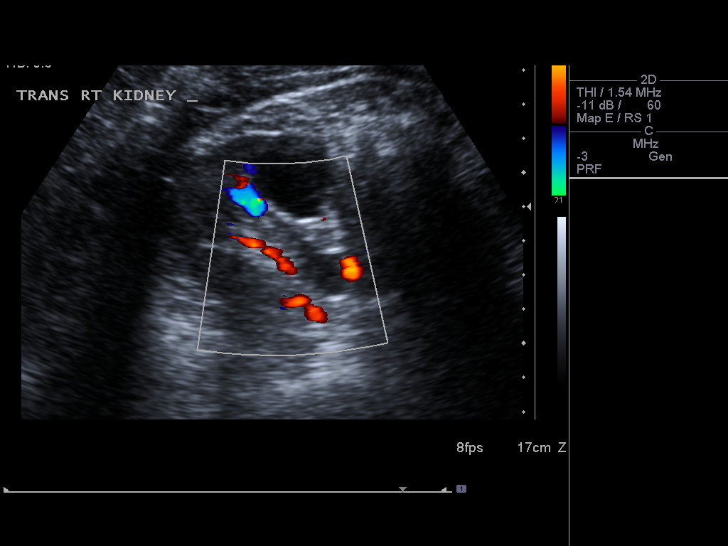
[im 72/87]
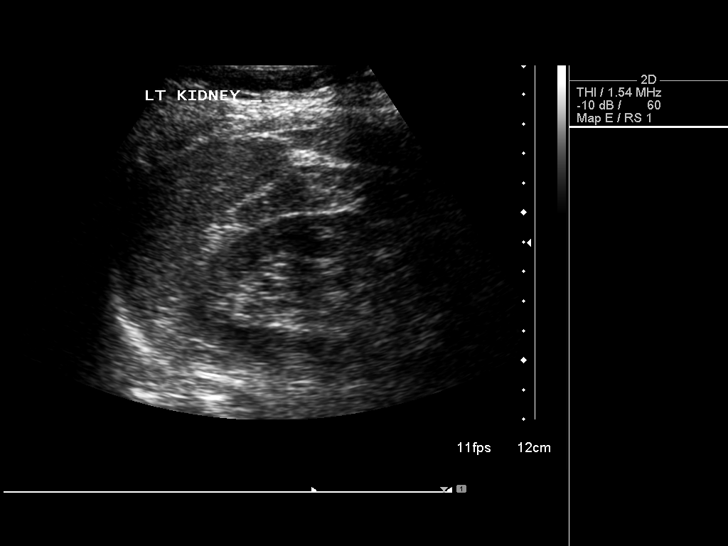
[im 79/87]
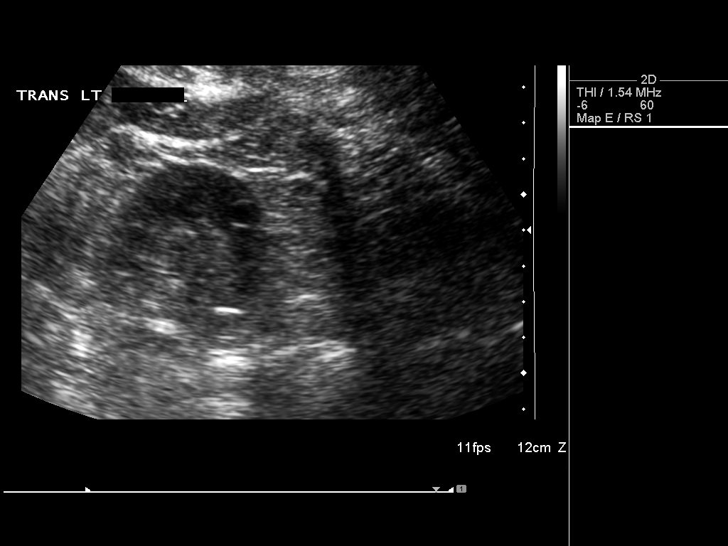
[im 87/87]
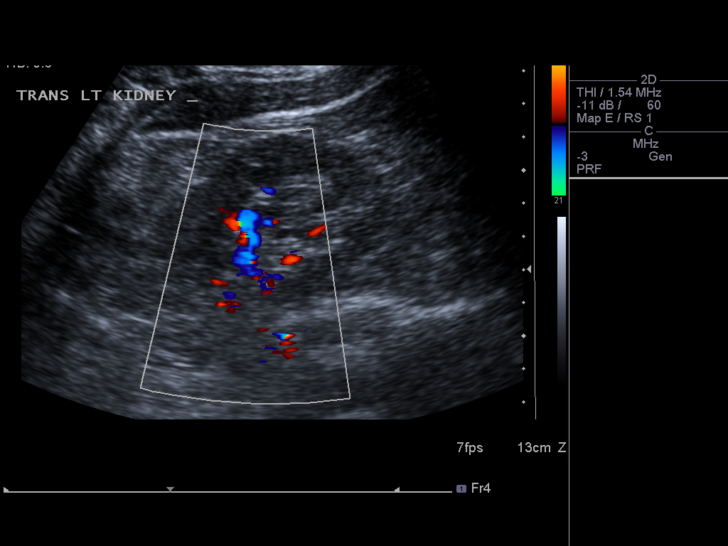

[13 of 25 positions shown; findings below may reference images not displayed]

FINDINGS: Gallbladder: No gallstones or wall thickening visualized. There is
no pericholecystic fluid. No sonographic Murphy sign noted.

Common bile duct: Diameter: 5 mm. There is no intrahepatic, common
hepatic, or common bile duct dilatation.

Liver: No focal lesion identified. Within normal limits in
parenchymal echogenicity.

IVC: No abnormality visualized.

Pancreas: No mass or inflammatory focus.

Spleen: Size and appearance within normal limits.

Right Kidney: Length: 11.2 cm. Echogenicity within normal limits. No
hydronephrosis visualized. There is a simple cyst in the upper pole
of the right kidney measuring 2.0 x 1.7 x 2.0 cm. There is a lower
pole right renal cyst measuring 2.1 x 2.5 x 2.1 cm. There are no
noncystic renal masses on the right.

Left Kidney: Length: 11.4 cm. Echogenicity within normal limits. No
hydronephrosis visualized. There is an 8 x 8 mm cyst in the
midportion of the left kidney. No other renal mass is identified.

Abdominal aorta: No aneurysm visualized.

Other findings: No demonstrable ascites.
IMPRESSION: Renal cysts as noted above.  Study otherwise unremarkable.

## 2016-04-03 DIAGNOSIS — M955 Acquired deformity of pelvis: Secondary | ICD-10-CM | POA: Diagnosis not present

## 2016-04-03 DIAGNOSIS — M9905 Segmental and somatic dysfunction of pelvic region: Secondary | ICD-10-CM | POA: Diagnosis not present

## 2016-04-03 DIAGNOSIS — M9903 Segmental and somatic dysfunction of lumbar region: Secondary | ICD-10-CM | POA: Diagnosis not present

## 2016-04-03 DIAGNOSIS — M5416 Radiculopathy, lumbar region: Secondary | ICD-10-CM | POA: Diagnosis not present

## 2016-04-30 DIAGNOSIS — M5416 Radiculopathy, lumbar region: Secondary | ICD-10-CM | POA: Diagnosis not present

## 2016-04-30 DIAGNOSIS — M955 Acquired deformity of pelvis: Secondary | ICD-10-CM | POA: Diagnosis not present

## 2016-04-30 DIAGNOSIS — M9903 Segmental and somatic dysfunction of lumbar region: Secondary | ICD-10-CM | POA: Diagnosis not present

## 2016-04-30 DIAGNOSIS — M9905 Segmental and somatic dysfunction of pelvic region: Secondary | ICD-10-CM | POA: Diagnosis not present

## 2016-05-05 ENCOUNTER — Other Ambulatory Visit: Payer: Self-pay | Admitting: Family Medicine

## 2016-05-05 DIAGNOSIS — Z1231 Encounter for screening mammogram for malignant neoplasm of breast: Secondary | ICD-10-CM

## 2016-05-08 ENCOUNTER — Ambulatory Visit: Payer: Medicare HMO

## 2016-05-28 DIAGNOSIS — M955 Acquired deformity of pelvis: Secondary | ICD-10-CM | POA: Diagnosis not present

## 2016-05-28 DIAGNOSIS — M9905 Segmental and somatic dysfunction of pelvic region: Secondary | ICD-10-CM | POA: Diagnosis not present

## 2016-05-28 DIAGNOSIS — M9903 Segmental and somatic dysfunction of lumbar region: Secondary | ICD-10-CM | POA: Diagnosis not present

## 2016-05-28 DIAGNOSIS — M5416 Radiculopathy, lumbar region: Secondary | ICD-10-CM | POA: Diagnosis not present

## 2016-06-02 ENCOUNTER — Ambulatory Visit (INDEPENDENT_AMBULATORY_CARE_PROVIDER_SITE_OTHER): Payer: Medicare HMO | Admitting: Family Medicine

## 2016-06-02 ENCOUNTER — Encounter: Payer: Self-pay | Admitting: Family Medicine

## 2016-06-02 VITALS — BP 132/80 | HR 86 | Temp 99.3°F | Ht 61.0 in | Wt 158.5 lb

## 2016-06-02 DIAGNOSIS — F4320 Adjustment disorder, unspecified: Secondary | ICD-10-CM | POA: Diagnosis not present

## 2016-06-02 DIAGNOSIS — R69 Illness, unspecified: Secondary | ICD-10-CM | POA: Diagnosis not present

## 2016-06-02 DIAGNOSIS — Z23 Encounter for immunization: Secondary | ICD-10-CM

## 2016-06-02 DIAGNOSIS — F4321 Adjustment disorder with depressed mood: Secondary | ICD-10-CM

## 2016-06-02 MED ORDER — BUPROPION HCL ER (XL) 150 MG PO TB24: 150.0000 mg | ORAL_TABLET | Freq: Every day | ORAL | 11 refills | Status: DC

## 2016-06-02 NOTE — Progress Notes (Signed)
Pre visit review using our clinic review tool, if applicable. No additional management support is needed unless otherwise documented below in the visit note. 

## 2016-06-02 NOTE — Patient Instructions (Signed)
Start taking wellbutrin xl 150 mg once daily in the am  If you develop intolerable side effects or feel more depressed - alert [place] and stop the medicine  Take care of yourself Give yourself a break when you need it also  Let us know when you are ready to for grief counseling

## 2016-06-02 NOTE — Progress Notes (Signed)
Subjective:    Patient ID: Brianna Espinoza, female    DOB: Jun 06, 1951, 65 y.o.   MRN: [id number]  HPI  Here for a grief reaction   Her son died on 12-04-22  She had a hard time and then improved  About 2 weeks ago she had to clean out her son's house and this was really tough  Brought it all back  Cries a lot  Nothing makes her happy any more -cannot get joy    (her husband tries to plan things for her)  Keeps a black cloud-difficult because she is usually a very happy person  A big sadness   Having to take care of a lot of legal matters and end of life issues  She has had to stagger these issues to make it easier on her  She is taking care of herself   Walks once to twice daily outdoors Goes to [employer] -tries to / makes herself do things   At least she is sleeping now - sometimes she has a hard time getting up but she makes herself  Appetite is fair    This was sudden and unexpected -he had a massive heart attack No time to prepare herself  She is the only left in the whole family  Does not think she has a lot of PTSD symptoms   Still has a lot of support  She has not formally had any grief counseling yet -did not think she could handle it  (had it when her mother died and it was helpful)   Patient Active Problem List   Diagnosis Date Noted  . Grief reaction 12/13/2015  . Right facial pain 12/13/2015  . Routine general medical examination at a health care facility 11/19/2015  . Bloating symptom 09/11/2015  . Estrogen deficiency 04/01/2015  . Cerebral infarction due to thrombosis of right middle cerebral artery ([place]) 12/17/2014  . Encounter for Medicare annual wellness exam 11/16/2014  . Hyperglycemia 11/16/2014  . Otitis media, right 11/05/2014  . CVA (cerebral infarction) 10/03/2014  . Essential hypertension 10/03/2014  . Abdominal pain, right upper quadrant 05/07/2014  . Obesity 09/21/2011  . Osteopenia 03/12/2009  . Hyperlipidemia 12/13/2006  .  ALLERGIC RHINITIS 12/13/2006  . GERD 12/13/2006  . OSTEOARTHRITIS 12/13/2006   Past Medical History:  Diagnosis Date  . Allergy    allergic rhinitis  . Arthritis    OA  . Fibrocystic breast   . GERD (gastroesophageal reflux disease)   . Hyperlipidemia   . Obesity   . Osteopenia   . Stroke [facility])    cerebral infarction  . Tibia fracture 2012   playing golf    Past Surgical History:  Procedure Laterality Date  . BREAST CYST ASPIRATION  1980s   aspirated breast lump  . COLONOSCOPY  2012  . ESOPHAGOGASTRODUODENOSCOPY ENDOSCOPY  09/18/14  . INGUINAL HERNIA REPAIR Right 1956  . TUBAL LIGATION  1976   lap   Social History  Substance Use Topics  . Smoking status: Never Smoker  . Smokeless tobacco: Never Used  . Alcohol use 1.8 oz/week    3 Glasses of wine per week     Comment: occ-wine   Family History  Problem Relation Age of Onset  . Arthritis Mother   . Hyperlipidemia Mother   . Hypertension Mother   . COPD Mother   . Asthma Mother   . Stroke Father   . Heart disease Father   . Pancreatic cancer Maternal Grandfather   .  Colon cancer Neg Hx   . Esophageal cancer Neg Hx   . Rectal cancer Neg Hx   . Stomach cancer Neg Hx   . Arthritis Maternal Grandmother   . Hypertension Maternal Grandmother   . Heart disease Maternal Grandfather   . Stroke Maternal Grandmother    Allergies  Allergen Reactions  . Lipitor [Atorvastatin] Other (See Comments)    Muscle pain   . Tetanus Toxoid Hives  . Acetaminophen Rash  . Septra [Sulfamethoxazole-Trimethoprim] Rash    Body rash   Current Outpatient Prescriptions on File Prior to Visit  Medication Sig Dispense Refill  . aspirin EC 325 MG tablet Take 1 tablet (325 mg total) by mouth daily. 90 tablet 3  . Calcium Carbonate-Vit D-Min 600-400 MG-UNIT TABS Take 2 tablets by mouth daily.      . fexofenadine (ALLEGRA) 180 MG tablet Take 180 mg by mouth daily as needed (during allergy season).     . fluticasone (FLONASE) 50 MCG/ACT  nasal spray Place 2 sprays into both nostrils daily as needed for allergies or rhinitis.    . hydrochlorothiazide (HYDRODIURIL) 25 MG tablet Take 1 tablet (25 mg total) by mouth daily. 90 tablet 3  . ibuprofen (ADVIL,MOTRIN) 200 MG tablet Take 400 mg by mouth as needed.    . Multiple Vitamin (MULTIVITAMIN) tablet Take 1 tablet by mouth daily.      Marland Kitchen omeprazole (PRILOSEC) 20 MG capsule Take 1 capsule (20 mg total) by mouth every other day. 45 capsule 3   No current facility-administered medications on file prior to visit.     Review of Systems Review of Systems  Constitutional: Negative for fever, appetite change,  and unexpected weight change.  Eyes: Negative for pain and visual disturbance.  Respiratory: Negative for cough and shortness of breath.   Cardiovascular: Negative for cp or palpitations    Gastrointestinal: Negative for nausea, diarrhea and constipation.  Genitourinary: Negative for urgency and frequency.  Skin: Negative for pallor or rash   Neurological: Negative for weakness, light-headedness, numbness and headaches.  Hematological: Negative for adenopathy. Does not bruise/bleed easily.  Psychiatric/Behavioral: pos for dysphoric mood with sadness (not hopeless), fatigue, dis motivation and grief, neg for SI and neg for anxiety.         Objective:   Physical Exam  Constitutional: She appears well-developed and well-nourished. No distress.  overwt and well app  HENT:  Head: Normocephalic and atraumatic.  Mouth/Throat: Oropharynx is clear and moist.  Eyes: Conjunctivae and EOM are normal. Pupils are equal, round, and reactive to light.  Neck: Normal range of motion. Neck supple. No JVD present. Carotid bruit is not present. No thyromegaly present.  Cardiovascular: Normal rate, regular rhythm, normal heart sounds and intact distal pulses.  Exam reveals no gallop.   Pulmonary/Chest: Effort normal and breath sounds normal. No respiratory distress. She has no wheezes. She has  no rales.  No crackles  Abdominal: She exhibits no abdominal bruit.  Musculoskeletal: She exhibits no edema.  Lymphadenopathy:    She has no cervical adenopathy.  Neurological: She is alert. She has normal reflexes. She displays no tremor.  Skin: Skin is warm and dry. No rash noted.  Psychiatric: Her speech is normal and behavior is normal. Thought content normal. Her mood appears not anxious. Her affect is not blunt, not labile and not inappropriate. Thought content is not paranoid. She exhibits a depressed mood. She expresses no homicidal and no suicidal ideation.  Tearful at times  Good insight talkative  Assessment & Plan:   Problem List Items Addressed This Visit      Other   Grief reaction - Primary    Ongoing after loss of her son in the spring- worse after cleaning out his house  Reviewed stressors/ coping techniques/symptoms/ support sources/ tx options and side effects in detail today Good support and coping strategies overall  Disc imp of self care and exercise and outdoor time Offered ref for grief counseling Trial of wellbutrin xl - since symptoms are primarily vegetative/depressed/sad Discussed expectations of this medication including time to effectiveness and mechanism of action, also poss of side effects (early and late)- including mental fuzziness, weight or appetite change, nausea and poss of worse dep or anxiety (even suicidal thoughts)  Pt voiced understanding and will stop med and update if this occurs   >25 minutes spent in face to face time with patient, >50% spent in counselling or coordination of care        Other Visit Diagnoses    Need for influenza vaccination       Relevant Orders   Flu Vaccine QUAD 36+ mos IM (Completed)

## 2016-06-03 NOTE — Assessment & Plan Note (Signed)
Ongoing after loss of her son in the spring- worse after cleaning out his house  Reviewed stressors/ coping techniques/symptoms/ support sources/ tx options and side effects in detail today Good support and coping strategies overall  Disc imp of self care and exercise and outdoor time Offered ref for grief counseling Trial of wellbutrin xl - since symptoms are primarily vegetative/depressed/sad Discussed expectations of this medication including time to effectiveness and mechanism of action, also poss of side effects (early and late)- including mental fuzziness, weight or appetite change, nausea and poss of worse dep or anxiety (even suicidal thoughts)  Pt voiced understanding and will stop med and update if this occurs   >25 minutes spent in face to face time with patient, >50% spent in counselling or coordination of care

## 2016-06-08 ENCOUNTER — Ambulatory Visit: Payer: Medicare HMO | Admitting: Family Medicine

## 2016-06-11 ENCOUNTER — Encounter: Payer: Self-pay | Admitting: Family Medicine

## 2016-06-12 NOTE — Telephone Encounter (Signed)
Pt called. States rash has turned into hives (neck, chest, both arms). No swelling in mouth. Please advise.  [phone]

## 2016-06-14 ENCOUNTER — Other Ambulatory Visit: Payer: Self-pay | Admitting: Family Medicine

## 2016-06-15 ENCOUNTER — Encounter: Payer: Self-pay | Admitting: Neurology

## 2016-06-15 ENCOUNTER — Ambulatory Visit (INDEPENDENT_AMBULATORY_CARE_PROVIDER_SITE_OTHER): Payer: Medicare HMO | Admitting: Neurology

## 2016-06-15 ENCOUNTER — Ambulatory Visit
Admission: RE | Admit: 2016-06-15 | Discharge: 2016-06-15 | Disposition: A | Discharge status: Home / Self Care | Payer: Medicare HMO | Source: Ambulatory Visit | Attending: Family Medicine | Admitting: Family Medicine

## 2016-06-15 VITALS — BP 123/80 | HR 79 | Ht 61.0 in | Wt 159.8 lb

## 2016-06-15 DIAGNOSIS — I63311 Cerebral infarction due to thrombosis of right middle cerebral artery: Secondary | ICD-10-CM

## 2016-06-15 DIAGNOSIS — E785 Hyperlipidemia, unspecified: Secondary | ICD-10-CM | POA: Diagnosis not present

## 2016-06-15 DIAGNOSIS — Z1231 Encounter for screening mammogram for malignant neoplasm of breast: Secondary | ICD-10-CM | POA: Diagnosis not present

## 2016-06-15 DIAGNOSIS — I10 Essential (primary) hypertension: Secondary | ICD-10-CM | POA: Diagnosis not present

## 2016-06-15 NOTE — Progress Notes (Signed)
NEUROLOGY CLINIC FOLLOW UP NOTE  NAME: RISHITA ADAMEK DOB: June 24, 1951  REASON FOR VISIT: stroke follow up HISTORY FROM: Chart and patient  Today we had the pleasure of seeing Shelly Bombard in follow-up at our Neurology Clinic. Pt was accompanied by no one.   History summary  TYNIESHA LEH is a left handed 65 y.o. female with PMH of osteoporosis who seen on 10/11/14 as a new patient for stroke follow up. Patient stated that about 2 weeks prior she had a sudden onset left face, left arm and left leg numbness and tingling. She denies any weakness, vision changes, slurry speech. She called EMS, and was sent to [facility] where she had a CT head showed no acute changes however MRI showed right thalamus lacunar stroke. Her stroke workup there including carotid Doppler and 2-D echo were unremarkable. Her LDL 110. She was discharged with aspirin and Lipitor. She follow-up with Dr. Glori Bickers and was put on HCTZ for blood pressure control. She was referred here for further evaluation. For the last 2 weeks, patient stated that her symptoms gradually getting better, so far she only had intermittent left leg and left fingertip numbness tingling whenever she is tired. She denies any history of hypertension, diabetes or hyperlipidemia. She denies any history of smoking or illicit drug use. She occasionally drink a glass of wine. Family history including mom had COPD and died of age 45. Father had heart attack at 78 and stroke at 36. Grandma had history of stroke.  Follow up 12/17/14 - pt has been doing well. Her ASA was changed last time and she is on plavix and lipitor now and no side effects. Her left handwriting is much better than before and almost back to baseline. She still has intermittent finger tingling sensation at left hand but not feeling numbness on touch. Her BP at home 120/70s but today in clinic 142/83. She follows with Dr. Alba Cory regularly. Denies s/s of OSA.  06/16/16  follow up - pt has been doing well. No recurrent stroke like symptoms. No side effects from plavix. However, pt stated that she has to take ibuprofen intermittently for her arthritis. With winter is at the corner, she is asking whether she can switch from plavix to ASA to help for the arthritis so that limit use of ibuprofen. She stated that she was not on ASA or plavix when her stroke occurred. Otherwise, she is doing well and is going to have water therapy for PT. Has PCP follow up next 17-Dec-2022. BP at home 120s and today 140/85 in clinic.  Interval history During the interval time, pt has been doing well. She did not tolerate with several statin meds due to muscle pain and not able to walke. So far she is on no statin meds. Her BP well controlled and today 123/80. She is on ASA and her arthritis also in good control. She does not have much left hand and lip tingling unless she is upset or sick. Her son passed away this 17-Dec-2022 at age of 57 due to massive heart attack when she had left finger and lip tingling.    Past Medical History:  Diagnosis Date  . Allergy    allergic rhinitis  . Arthritis    OA  . Fibrocystic breast   . GERD (gastroesophageal reflux disease)   . Hyperlipidemia   . Obesity   . Osteopenia   . Stroke [facility])    cerebral infarction  . Tibia fracture 2012  playing golf    Past Surgical History:  Procedure Laterality Date  . BREAST CYST ASPIRATION  1980s   aspirated breast lump  . COLONOSCOPY  2012  . ESOPHAGOGASTRODUODENOSCOPY ENDOSCOPY  09/18/14  . INGUINAL HERNIA REPAIR Right 1956  . TUBAL LIGATION  1976   lap   Family History  Problem Relation Age of Onset  . Arthritis Mother   . Hyperlipidemia Mother   . Hypertension Mother   . COPD Mother   . Asthma Mother   . Stroke Father   . Heart disease Father   . Pancreatic cancer Maternal Grandfather   . Heart disease Maternal Grandfather   . Arthritis Maternal Grandmother   . Hypertension Maternal Grandmother   .  Stroke Maternal Grandmother   . Colon cancer Neg Hx   . Esophageal cancer Neg Hx   . Rectal cancer Neg Hx   . Stomach cancer Neg Hx    Current Outpatient Prescriptions  Medication Sig Dispense Refill  . aspirin EC 325 MG tablet Take 1 tablet (325 mg total) by mouth daily. 90 tablet 3  . buPROPion (WELLBUTRIN XL) 150 MG 24 hr tablet     . Calcium Carbonate-Vit D-Min 600-400 MG-UNIT TABS Take 2 tablets by mouth daily.      . fexofenadine (ALLEGRA) 180 MG tablet Take 180 mg by mouth daily as needed (during allergy season).     . fluticasone (FLONASE) 50 MCG/ACT nasal spray Place 2 sprays into both nostrils daily as needed for allergies or rhinitis.    . hydrochlorothiazide (HYDRODIURIL) 25 MG tablet Take 1 tablet (25 mg total) by mouth daily. 90 tablet 3  . Multiple Vitamin (MULTIVITAMIN) tablet Take 1 tablet by mouth daily.      . Omega-3 Fatty Acids (FISH OIL PO) Take 1 capsule by mouth daily.    Marland Kitchen omeprazole (PRILOSEC) 20 MG capsule Take 1 capsule (20 mg total) by mouth every other day. 45 capsule 3   No current facility-administered medications for this visit.    Allergies  Allergen Reactions  . Lipitor [Atorvastatin] Other (See Comments)    Muscle pain   . Tetanus Toxoid Hives  . Wellbutrin [Bupropion] Hives  . Acetaminophen Rash  . Septra [Sulfamethoxazole-Trimethoprim] Rash    Body rash   Social History   Social History  . Marital status: Married    Spouse name: N/A  . Number of children: 1  . Years of education: 40   Occupational History  . retired    Social History Main Topics  . Smoking status: Never Smoker  . Smokeless tobacco: Never Used  . Alcohol use 1.8 oz/week    3 Glasses of wine per week     Comment: occ-wine  . Drug use: No  . Sexual activity: Not on file   Other Topics Concern  . Not on file   Social History Narrative   Patient is married with one child.   Patient is left handed.   Patient has 14 yrs of education.   Patient drinks 1 and 1/2  cup daily.    Review of Systems Full 14 system review of systems performed and notable only for those listed, all others are neg:  Constitutional:   Cardiovascular:  Ear/Nose/Throat:   Skin:  Eyes:   Respiratory:   Gastroitestinal:   Genitourinary:  Hematology/Lymphatic:   Endocrine:  Musculoskeletal:   Allergy/Immunology:   Neurological:   Psychiatric: depression, anxious, nervous Sleep:    Physical Exam  Vitals:   06/15/16 [id number]  BP: 123/80  Pulse: 79    General - Well nourished, well developed, in no apparent distress.  Ophthalmologic - Sharp disc margins OU.  Cardiovascular - Regular rate and rhythm with no murmur. Carotid pulses were 2+ without bruits.   Neck - supple, no nuchal rigidity .  Mental Status -  Level of arousal and orientation to time, place, and person were intact. Language including expression, naming, repetition, comprehension, reading, and writing was assessed and found intact. Attention span and concentration were normal. Recent and remote memory were intact. Fund of Knowledge was assessed and was intact.  Cranial Nerves II - XII - II - Visual field intact OU. III, IV, VI - Extraocular movements intact. V - Facial sensation intact bilaterally. VII - Facial movement intact bilaterally. VIII - Hearing & vestibular intact bilaterally X - Palate elevates symmetrically. XI - Chin turning & shoulder shrug intact bilaterally. XII - Tongue protrusion intact.  Motor Strength - The patient's strength was normal in all extremities and pronator drift was absent.  Bulk was normal and fasciculations were absent.   Motor Tone - Muscle tone was assessed at the neck and appendages and was normal.  Reflexes - The patient's reflexes were normal in all extremities and she had no pathological reflexes.  Sensory - Light touch, temperature/pinprick, vibration and proprioception, and Romberg testing were assessed and were normal.    Coordination - The  patient had normal movements in the hands and feet with no ataxia or dysmetria.  Tremor was absent.  Gait and Station - The patient's transfers, posture, gait, station, and turns were observed as normal.   Imaging MRI head 09/26/2014 1 cm acute ischemic infarct involving the lateral right thalamus. No associated hemorrhage or significant mass effect.  MRA 10/22/14 - This is a normal noncontrasted MR angiogram of the intracerebral arteries.  CT head 09/26/14 Normal for age noncontrast CT appearance of the brain  Carotid Doppler  minimal amount of bilateral intimal wall thickening, left subjectively greater than right, not resulting in the hemodynamically significant stenosis.  2-D echo - Left ventricular ejection fraction is 70-75% - Normal global left ventricular systolic function - Mild mitral valve regurgitation - Mild tricuspid regurgitation  Lab Review  Component     Latest Ref Rng 10/23/2014 11/16/2014  Cholesterol     0 - 200 mg/dL 140   Triglycerides     0.0 - 149.0 mg/dL 71.0   HDL Cholesterol     >39.00 mg/dL 43.50   VLDL     0.0 - 40.0 mg/dL 14.2   LDL (calc)     0 - 99 mg/dL 82   Total CHOL/HDL Ratio      3   NonHDL      96.50   Hemoglobin A1C     4.6 - 6.5 % 6.0   TSH     0.35 - 4.50 uIU/mL  1.56     Assessment:   In summary, BRINAE REINHOLTZ is a 65 y.o. female with PMH of osteoporosis follows up in clinic for right thalamic stroke on MRI. Still has intermittent left hand fingertip tingling. Exam normal. Her stroke risk factor including age, hyperlipidemia, and hypertension. Carotid Doppler and a 2-D echo unremarkable. MRA and A1c normal. Switch aspirin to Plavix. And continued on lipitor. During the interval time, she is doing well. Due to arthritis and intermittent use of ibuprofen, plavix changed to full dose ASA to help arthritis also. She did not tolerate statin meds, and off statin at  this time. Otherwise, she is doing well.   Plan: - continue ASA 325  for stroke prevention and also arthritis treatment - discuss with Dr. Glori Bickers about PCSK9 inhibitor including repatha and praluent  - Check BP at home - Follow up with your primary care physician for stroke risk factor modification. Recommend maintain blood pressure goal <130/80, diabetes with hemoglobin A1c goal below 6.5% and lipids with LDL cholesterol goal below 70 mg/dL. - healthy diet and regular exercise - follow up as needed    No orders of the defined types were placed in this encounter.   Meds ordered this encounter  Medications  . buPROPion (WELLBUTRIN XL) 150 MG 24 hr tablet    Patient Instructions  - continue ASA 325 for stroke prevention and also arthritis treatment - discuss with Dr. Glori Bickers about PCSK9 inhibitor including repatha and praluent  - Check BP at home - Follow up with your primary care physician for stroke risk factor modification. Recommend maintain blood pressure goal <130/80, diabetes with hemoglobin A1c goal below 6.5% and lipids with LDL cholesterol goal below 70 mg/dL. - healthy diet and regular exercise - follow up as needed    Rosalin Hawking, MD PhD [facility] [address] [place], [place] [address] [phone]

## 2016-06-15 NOTE — Patient Instructions (Signed)
-   continue ASA 325 for stroke prevention and also arthritis treatment - discuss with Dr. Glori Bickers about PCSK9 inhibitor including repatha and praluent  - Check BP at home - Follow up with your primary care physician for stroke risk factor modification. Recommend maintain blood pressure goal <130/80, diabetes with hemoglobin A1c goal below 6.5% and lipids with LDL cholesterol goal below 70 mg/dL. - healthy diet and regular exercise - follow up as needed

## 2016-06-16 MED ORDER — ESCITALOPRAM OXALATE 10 MG PO TABS: 10.0000 mg | ORAL_TABLET | Freq: Every day | ORAL | 1 refills | Status: DC

## 2016-06-19 ENCOUNTER — Telehealth: Payer: Self-pay | Admitting: Family Medicine

## 2016-06-19 MED ORDER — BUSPIRONE HCL 15 MG PO TABS: 7.5000 mg | ORAL_TABLET | Freq: Two times a day (BID) | ORAL | 11 refills | Status: DC

## 2016-06-19 NOTE — Telephone Encounter (Signed)
Trial of buspar

## 2016-07-01 ENCOUNTER — Other Ambulatory Visit: Payer: Self-pay | Admitting: Family Medicine

## 2016-07-01 ENCOUNTER — Encounter: Payer: Self-pay | Admitting: Family Medicine

## 2016-07-07 DIAGNOSIS — Z1283 Encounter for screening for malignant neoplasm of skin: Secondary | ICD-10-CM | POA: Diagnosis not present

## 2016-07-07 DIAGNOSIS — L570 Actinic keratosis: Secondary | ICD-10-CM | POA: Diagnosis not present

## 2016-07-07 DIAGNOSIS — L718 Other rosacea: Secondary | ICD-10-CM | POA: Diagnosis not present

## 2016-09-08 DIAGNOSIS — H2513 Age-related nuclear cataract, bilateral: Secondary | ICD-10-CM | POA: Diagnosis not present

## 2016-11-15 ENCOUNTER — Telehealth: Payer: Self-pay | Admitting: Family Medicine

## 2016-11-15 DIAGNOSIS — R739 Hyperglycemia, unspecified: Secondary | ICD-10-CM

## 2016-11-15 DIAGNOSIS — Z0000 Encounter for general adult medical examination without abnormal findings: Secondary | ICD-10-CM

## 2016-11-15 NOTE — Telephone Encounter (Signed)
-----   Message from Ellamae Sia sent at 11/12/2016  3:03 PM EDT ----- Regarding: Lab orders for Monday, 4.23.18 Patient is scheduled for CPX labs, please order future labs, Thanks , Karna Christmas

## 2016-11-23 ENCOUNTER — Other Ambulatory Visit: Payer: Medicare HMO

## 2016-11-23 ENCOUNTER — Ambulatory Visit (INDEPENDENT_AMBULATORY_CARE_PROVIDER_SITE_OTHER): Payer: Medicare HMO

## 2016-11-23 VITALS — BP 126/84 | HR 66 | Temp 98.2°F | Ht 61.0 in | Wt 158.8 lb

## 2016-11-23 DIAGNOSIS — Z0000 Encounter for general adult medical examination without abnormal findings: Secondary | ICD-10-CM

## 2016-11-23 DIAGNOSIS — Z1159 Encounter for screening for other viral diseases: Secondary | ICD-10-CM

## 2016-11-23 DIAGNOSIS — R739 Hyperglycemia, unspecified: Secondary | ICD-10-CM

## 2016-11-23 LAB — CBC WITH DIFFERENTIAL/PLATELET
BASOS ABS: 0.1 10*3/uL (ref 0.0–0.1)
Basophils Relative: 1 % (ref 0.0–3.0)
EOS ABS: 0.2 10*3/uL (ref 0.0–0.7)
Eosinophils Relative: 1.8 % (ref 0.0–5.0)
HCT: 42.2 % (ref 36.0–46.0)
Hemoglobin: 14.3 g/dL (ref 12.0–15.0)
LYMPHS ABS: 2.2 10*3/uL (ref 0.7–4.0)
Lymphocytes Relative: 24.5 % (ref 12.0–46.0)
MCHC: 33.8 g/dL (ref 30.0–36.0)
MCV: 90.1 fl (ref 78.0–100.0)
MONO ABS: 0.5 10*3/uL (ref 0.1–1.0)
Monocytes Relative: 5.6 % (ref 3.0–12.0)
NEUTROS ABS: 6.1 10*3/uL (ref 1.4–7.7)
NEUTROS PCT: 67.1 % (ref 43.0–77.0)
PLATELETS: 275 10*3/uL (ref 150.0–400.0)
RBC: 4.68 Mil/uL (ref 3.87–5.11)
RDW: 13.7 % (ref 11.5–15.5)
WBC: 9.1 10*3/uL (ref 4.0–10.5)

## 2016-11-23 LAB — COMPREHENSIVE METABOLIC PANEL
ALT: 14 U/L (ref 0–35)
AST: 19 U/L (ref 0–37)
Albumin: 4.2 g/dL (ref 3.5–5.2)
Alkaline Phosphatase: 68 U/L (ref 39–117)
BILIRUBIN TOTAL: 0.6 mg/dL (ref 0.2–1.2)
BUN: 19 mg/dL (ref 6–23)
CO2: 31 meq/L (ref 19–32)
CREATININE: 0.9 mg/dL (ref 0.40–1.20)
Calcium: 9.9 mg/dL (ref 8.4–10.5)
Chloride: 102 mEq/L (ref 96–112)
GFR: 66.15 mL/min (ref >60.00)
GLUCOSE: 99 mg/dL (ref 70–99)
Potassium: 3.8 mEq/L (ref 3.5–5.1)
SODIUM: 140 meq/L (ref 135–145)
Total Protein: 7.2 g/dL (ref 6.0–8.3)

## 2016-11-23 LAB — LIPID PANEL
CHOL/HDL RATIO: 4
Cholesterol: 192 mg/dL (ref 0–200)
HDL: 53.8 mg/dL (ref >39.00)
LDL CALC: 124 mg/dL — AB (ref 0–99)
NonHDL: 137.86
TRIGLYCERIDES: 69 mg/dL (ref 0.0–149.0)
VLDL: 13.8 mg/dL (ref 0.0–40.0)

## 2016-11-23 LAB — HEMOGLOBIN A1C: Hgb A1c MFr Bld: 5.9 % (ref 4.6–6.5)

## 2016-11-23 LAB — TSH: TSH: 1.71 u[IU]/mL (ref 0.35–4.50)

## 2016-11-23 NOTE — Progress Notes (Signed)
PCP notes:   Health maintenance:  Hep C screening - completed  Abnormal screenings:   Hearing - failed Depression score: 3 (death of son)  Patient concerns:   None  Nurse concerns:  None  Next PCP appt:   11/25/16 @ 1030  I reviewed health advisor's note, was available for consultation, and agree with documentation and plan. Loura Pardon MD

## 2016-11-23 NOTE — Progress Notes (Signed)
Pre visit review using our clinic review tool, if applicable. No additional management support is needed unless otherwise documented below in the visit note. 

## 2016-11-23 NOTE — Patient Instructions (Signed)
Brianna Espinoza , Thank you for taking time to come for your Medicare Wellness Visit. I appreciate your ongoing commitment to your health goals. Please review the following plan we discussed and let me know if I can assist you in the future.   These are the goals we discussed: Goals    . Increase physical activity          Starting 11/23/16, I will continue to exercise for at least 60 min 5 days per week.       This is a list of the screening recommended for you and due dates:  Health Maintenance  Topic Date Due  . Flu Shot  03/03/2017  . Mammogram  06/15/2018  . Colon Cancer Screening  06/21/2021  . Tetanus Vaccine  09/20/2021  . DEXA scan (bone density measurement)  Completed  .  Hepatitis C: One time screening is recommended by Center for Disease Control  (CDC) for  adults born from [birth year] through 1967.   Completed  . Pneumonia vaccines  Completed   Preventive Care for Adults  A healthy lifestyle and preventive care can promote health and wellness. Preventive health guidelines for adults include the following key practices.  . A routine yearly physical is a good way to check with your health care provider about your health and preventive screening. It is a chance to share any concerns and updates on your health and to receive a thorough exam.  . Visit your dentist for a routine exam and preventive care every 6 months. Brush your teeth twice a day and floss once a day. Good oral hygiene prevents tooth decay and gum disease.  . The frequency of eye exams is based on your age, health, family medical history, use  of contact lenses, and other factors. Follow your health care provider's ecommendations for frequency of eye exams.  . Eat a healthy diet. Foods like vegetables, fruits, whole grains, low-fat dairy products, and lean protein foods contain the nutrients you need without too many calories. Decrease your intake of foods high in solid fats, added sugars, and salt. Eat the right amount  of calories for you. Get information about a proper diet from your health care provider, if necessary.  . Regular physical exercise is one of the most important things you can do for your health. Most adults should get at least 150 minutes of moderate-intensity exercise (any activity that increases your heart rate and causes you to sweat) each week. In addition, most adults need muscle-strengthening exercises on 2 or more days a week.  Silver Sneakers may be a benefit available to you. To determine eligibility, you may visit the website: www.silversneakers.com or contact program at 8380014985 Mon-Fri between 8AM-8PM.   . Maintain a healthy weight. The body mass index (BMI) is a screening tool to identify possible weight problems. It provides an estimate of body fat based on height and weight. Your health care provider can find your BMI and can help you achieve or maintain a healthy weight.   For adults 20 years and older: ? A BMI below 18.5 is considered underweight. ? A BMI of 18.5 to 24.9 is normal. ? A BMI of 25 to 29.9 is considered overweight. ? A BMI of 30 and above is considered obese.   . Maintain normal blood lipids and cholesterol levels by exercising and minimizing your intake of saturated fat. Eat a balanced diet with plenty of fruit and vegetables. Blood tests for lipids and cholesterol should begin at age 69  and be repeated every 5 years. If your lipid or cholesterol levels are high, you are over 50, or you are at high risk for heart disease, you may need your cholesterol levels checked more frequently. Ongoing high lipid and cholesterol levels should be treated with medicines if diet and exercise are not working.  . If you smoke, find out from your health care provider how to quit. If you do not use tobacco, please do not start.  . If you choose to drink alcohol, please do not consume more than 2 drinks per day. One drink is considered to be 12 ounces (355 mL) of beer, 5 ounces  (148 mL) of wine, or 1.5 ounces (44 mL) of liquor.  . If you are 82-78 years old, ask your health care provider if you should take aspirin to prevent strokes.  . Use sunscreen. Apply sunscreen liberally and repeatedly throughout the day. You should seek shade when your shadow is shorter than you. Protect yourself by wearing long sleeves, pants, a wide-brimmed hat, and sunglasses year round, whenever you are outdoors.  . Once a month, do a whole body skin exam, using a mirror to look at the skin on your back. Tell your health care provider of new moles, moles that have irregular borders, moles that are larger than a pencil eraser, or moles that have changed in shape or color.

## 2016-11-23 NOTE — Progress Notes (Signed)
Subjective:   Brianna Espinoza is a 66 y.o. female who presents for Medicare Annual (Subsequent) preventive examination.  Review of Systems:  N/A Cardiac Risk Factors include: advanced age (>68men, >26 women);obesity (BMI >30kg/m2);dyslipidemia;hypertension     Objective:     Vitals: BP 126/84 (BP Location: Right Arm, Patient Position: Sitting, Cuff Size: Normal)   Pulse 66   Temp 98.2 F (36.8 C) (Oral)   Ht 5\' 1"  (1.549 m) Comment: no shoes  Wt 158 lb 12 oz (72 kg)   SpO2 98%   BMI 30.00 kg/m   Body mass index is 30 kg/m.   Tobacco History  Smoking Status  . Never Smoker  Smokeless Tobacco  . Never Used     Counseling given: No   Past Medical History:  Diagnosis Date  . Allergy    allergic rhinitis  . Arthritis    OA  . Fibrocystic breast   . GERD (gastroesophageal reflux disease)   . Hyperlipidemia   . Obesity   . Osteopenia   . Stroke [facility])    cerebral infarction  . Tibia fracture 2012   playing golf    Past Surgical History:  Procedure Laterality Date  . BREAST CYST ASPIRATION  1980s   aspirated breast lump  . COLONOSCOPY  2012  . ESOPHAGOGASTRODUODENOSCOPY ENDOSCOPY  09/18/14  . INGUINAL HERNIA REPAIR Right 1956  . TUBAL LIGATION  1976   lap   Family History  Problem Relation Age of Onset  . Arthritis Mother   . Hyperlipidemia Mother   . Hypertension Mother   . COPD Mother   . Asthma Mother   . Stroke Father   . Heart disease Father   . Pancreatic cancer Maternal Grandfather   . Heart disease Maternal Grandfather   . Arthritis Maternal Grandmother   . Hypertension Maternal Grandmother   . Stroke Maternal Grandmother   . Colon cancer Neg Hx   . Esophageal cancer Neg Hx   . Rectal cancer Neg Hx   . Stomach cancer Neg Hx    History  Sexual Activity  . Sexual activity: Not on file    Outpatient Encounter Prescriptions as of 11/23/2016  Medication Sig  . aspirin EC 325 MG tablet Take 1 tablet (325 mg total) by mouth daily.  Marland Kitchen  buPROPion (WELLBUTRIN XL) 150 MG 24 hr tablet   . Calcium Carbonate-Vit D-Min 600-400 MG-UNIT TABS Take 2 tablets by mouth daily.    . fexofenadine (ALLEGRA) 180 MG tablet Take 180 mg by mouth daily as needed (during allergy season).   . fluticasone (FLONASE) 50 MCG/ACT nasal spray Place 2 sprays into both nostrils daily as needed for allergies or rhinitis.  . hydrochlorothiazide (HYDRODIURIL) 25 MG tablet Take 1 tablet (25 mg total) by mouth daily.  . Multiple Vitamin (MULTIVITAMIN) tablet Take 1 tablet by mouth daily.    . Omega-3 Fatty Acids (FISH OIL PO) Take 1 capsule by mouth daily.  Marland Kitchen omeprazole (PRILOSEC) 20 MG capsule Take 1 capsule (20 mg total) by mouth every other day.   No facility-administered encounter medications on file as of 11/23/2016.     Activities of Daily Living In your present state of health, do you have any difficulty performing the following activities: 11/23/2016  Hearing? N  Vision? N  Difficulty concentrating or making decisions? N  Walking or climbing stairs? N  Dressing or bathing? N  Doing errands, shopping? N  Preparing Food and eating ? N  Using the Toilet? N  In  the past six months, have you accidently leaked urine? N  Do you have problems with loss of bowel control? N  Managing your Medications? N  Managing your Finances? N  Housekeeping or managing your Housekeeping? N  Some recent data might be hidden    Patient Care Team: Abner Greenspan, MD as PCP - General Eulogio Bear, MD as Consulting Physician (Ophthalmology)    Assessment:     Hearing Screening   125Hz  250Hz  500Hz  1000Hz  2000Hz  3000Hz  4000Hz  6000Hz  8000Hz   Right ear:   0 0 40  0    Left ear:   0 40 40  0    Vision Screening Comments: Last vision exam in Jan 2018 with Dr. Benay Pillow  Exercise Activities and Dietary recommendations Current Exercise Habits: Home exercise routine, Type of exercise: Other - see comments;walking;stretching (water aerobics, golf, gardening), Time  (Minutes): 60, Frequency (Times/Week): 5, Weekly Exercise (Minutes/Week): 300, Intensity: Moderate, Exercise limited by: None identified  Goals    . Increase physical activity          Starting 11/23/16, I will continue to exercise for at least 60 min 5 days per week.      Fall Risk Fall Risk  11/23/2016 06/15/2016 11/19/2015 06/17/2015 11/13/2013  Falls in the past year? No No No No No   Depression Screen PHQ 2/9 Scores 11/23/2016 11/19/2015 11/13/2013  PHQ - 2 Score 2 0 0  PHQ- 9 Score 3 - -     Cognitive Function MMSE - Mini Mental State Exam 11/23/2016  Orientation to time 5  Orientation to Place 5  Registration 3  Attention/ Calculation 0  Recall 3  Language- name 2 objects 0  Language- repeat 1  Language- follow 3 step command 3  Language- read & follow direction 0  Write a sentence 0  Copy design 0  Total score 20     PLEASE NOTE: A Mini-Cog screen was completed. Maximum score is 20. A value of 0 denotes this part of Folstein MMSE was not completed or the patient failed this part of the Mini-Cog screening.   Mini-Cog Screening Orientation to Time - Max 5 pts Orientation to Place - Max 5 pts Registration - Max 3 pts Recall - Max 3 pts Language Repeat - Max 1 pts Language Follow 3 Step Command - Max 3 pts     Immunization History  Administered Date(s) Administered  . Influenza Split 04/29/2011, 07/20/2012  . Influenza,inj,Quad PF,36+ Mos 05/30/2014, 05/24/2015, 06/02/2016  . Influenza-Unspecified 05/25/2013  . Pneumococcal Conjugate-13 11/16/2014  . Pneumococcal Polysaccharide-23 11/19/2015  . Zoster 02/21/2014   Screening Tests Health Maintenance  Topic Date Due  . INFLUENZA VACCINE  03/03/2017  . MAMMOGRAM  06/15/2018  . COLONOSCOPY  06/21/2021  . TETANUS/TDAP  09/20/2021  . DEXA SCAN  Completed  . Hepatitis C Screening  Completed  . PNA vac Low Risk Adult  Completed      Plan:     I have personally reviewed and addressed the Medicare Annual  Wellness questionnaire and have noted the following in the patient's chart:  A. Medical and social history B. Use of alcohol, tobacco or illicit drugs  C. Current medications and supplements D. Functional ability and status E.  Nutritional status F.  Physical activity G. Advance directives H. List of other physicians I.  Hospitalizations, surgeries, and ER visits in previous 12 months J.  [place] to include hearing, vision, cognitive, depression L. Referrals and appointments - none  In  addition, I have reviewed and discussed with patient certain preventive protocols, quality metrics, and best practice recommendations. A written personalized care plan for preventive services as well as general preventive health recommendations were provided to patient.  See attached scanned questionnaire for additional information.   Signed,   Lindell Noe, MHA, BS, LPN Health Coach

## 2016-11-24 LAB — HEPATITIS C ANTIBODY: HCV Ab: NEGATIVE

## 2016-11-25 ENCOUNTER — Ambulatory Visit (INDEPENDENT_AMBULATORY_CARE_PROVIDER_SITE_OTHER): Payer: Medicare HMO | Admitting: Family Medicine

## 2016-11-25 ENCOUNTER — Encounter: Payer: Self-pay | Admitting: Family Medicine

## 2016-11-25 VITALS — BP 118/74 | HR 78 | Temp 99.2°F | Ht 61.0 in | Wt 158.8 lb

## 2016-11-25 DIAGNOSIS — I10 Essential (primary) hypertension: Secondary | ICD-10-CM | POA: Diagnosis not present

## 2016-11-25 DIAGNOSIS — M8580 Other specified disorders of bone density and structure, unspecified site: Secondary | ICD-10-CM | POA: Diagnosis not present

## 2016-11-25 DIAGNOSIS — E6609 Other obesity due to excess calories: Secondary | ICD-10-CM | POA: Diagnosis not present

## 2016-11-25 DIAGNOSIS — Z6830 Body mass index (BMI) 30.0-30.9, adult: Secondary | ICD-10-CM | POA: Diagnosis not present

## 2016-11-25 DIAGNOSIS — R739 Hyperglycemia, unspecified: Secondary | ICD-10-CM | POA: Diagnosis not present

## 2016-11-25 DIAGNOSIS — E785 Hyperlipidemia, unspecified: Secondary | ICD-10-CM

## 2016-11-25 DIAGNOSIS — F4320 Adjustment disorder, unspecified: Secondary | ICD-10-CM

## 2016-11-25 DIAGNOSIS — Z0000 Encounter for general adult medical examination without abnormal findings: Secondary | ICD-10-CM | POA: Diagnosis not present

## 2016-11-25 DIAGNOSIS — F4321 Adjustment disorder with depressed mood: Secondary | ICD-10-CM

## 2016-11-25 DIAGNOSIS — I63311 Cerebral infarction due to thrombosis of right middle cerebral artery: Secondary | ICD-10-CM

## 2016-11-25 DIAGNOSIS — R69 Illness, unspecified: Secondary | ICD-10-CM | POA: Diagnosis not present

## 2016-11-25 MED ORDER — OMEPRAZOLE 20 MG PO CPDR: 20.0000 mg | DELAYED_RELEASE_CAPSULE | ORAL | 3 refills | Status: DC

## 2016-11-25 MED ORDER — HYDROCHLOROTHIAZIDE 25 MG PO TABS: 25.0000 mg | ORAL_TABLET | Freq: Every day | ORAL | 3 refills | Status: DC

## 2016-11-25 NOTE — Progress Notes (Signed)
Subjective:    Patient ID: Brianna Espinoza, female    DOB: September 03, 1950, 66 y.o.   MRN: [id number]  HPI  Here for health maintenance exam and to review chronic medical problems    Doing a lot of different things to deal with grief  Doing ok overall  Lost her son a year ago tomorrow   Wt Readings from Last 3 Encounters:  11/25/16 158 lb 12 oz (72 kg)  11/23/16 158 lb 12 oz (72 kg)  06/15/16 159 lb 12.8 oz (72.5 kg)  working on taking care of herself  Wt is stable  Getting exercise - walking/ with dog every day and goes to the gym and plays golf  Outdoors as much as possible  bmi 30  Had amw visit 4/23 Failed hearing -worse in R ear Depression score 3 (death of a son)  Mammogram 07/07/2023 normal Self breast exam-no lumps or changes No gyn problems or symptoms  No new partners   Declines tetanus vaccine  dexa 11/16-stable osteopenia  Tibia fracture 2012 playing golf  No fx or falls since  Taking her ca and D   Completed hep C screen- negative   Zoster vaccine 7/15 May be interested in Shingrix   Colonoscopy 11/12 nl with 10 y recall   bp is stable today  No cp or palpitations or headaches or edema  No side effects to medicines  BP Readings from Last 3 Encounters:  11/25/16 118/74  11/23/16 126/84  06/15/16 123/80    Hx of R thalamic infarct in the past   Hx of hyperglycemia Lab Results  Component Value Date   HGBA1C 5.9 11/23/2016  down from 6  Watching her diet   Hx of hyperlipidemia Lab Results  Component Value Date   CHOL 192 11/23/2016   CHOL 196 11/14/2015   CHOL 140 10/23/2014   Lab Results  Component Value Date   HDL 53.80 11/23/2016   HDL 47.10 11/14/2015   HDL 43.50 10/23/2014   Lab Results  Component Value Date   LDLCALC 124 (H) 11/23/2016   LDLCALC 133 (H) 11/14/2015   LDLCALC 82 10/23/2014   Lab Results  Component Value Date   TRIG 69.0 11/23/2016   TRIG 79.0 11/14/2015   TRIG 71.0 10/23/2014   Lab Results  Component Value  Date   CHOLHDL 4 11/23/2016   CHOLHDL 4 11/14/2015   CHOLHDL 3 10/23/2014   No results found for: LDLDIRECT She is intolerant of statins unfortinately  Hx of cva with goal LDL under 70 Talked to her neurologist about PCYK9  Results for orders placed or performed in visit on 11/23/16  CBC with Differential/Platelet  Result Value Ref Range   WBC 9.1 4.0 - 10.5 K/uL   RBC 4.68 3.87 - 5.11 Mil/uL   Hemoglobin 14.3 12.0 - 15.0 g/dL   HCT 42.2 36.0 - 46.0 %   MCV 90.1 78.0 - 100.0 fl   MCHC 33.8 30.0 - 36.0 g/dL   RDW 13.7 11.5 - 15.5 %   Platelets 275.0 150.0 - 400.0 K/uL   Neutrophils Relative % 67.1 43.0 - 77.0 %   Lymphocytes Relative 24.5 12.0 - 46.0 %   Monocytes Relative 5.6 3.0 - 12.0 %   Eosinophils Relative 1.8 0.0 - 5.0 %   Basophils Relative 1.0 0.0 - 3.0 %   Neutro Abs 6.1 1.4 - 7.7 K/uL   Lymphs Abs 2.2 0.7 - 4.0 K/uL   Monocytes Absolute 0.5 0.1 - 1.0 K/uL  Eosinophils Absolute 0.2 0.0 - 0.7 K/uL   Basophils Absolute 0.1 0.0 - 0.1 K/uL  Comprehensive metabolic panel  Result Value Ref Range   Sodium 140 135 - 145 mEq/L   Potassium 3.8 3.5 - 5.1 mEq/L   Chloride 102 96 - 112 mEq/L   CO2 31 19 - 32 mEq/L   Glucose, Bld 99 70 - 99 mg/dL   BUN 19 6 - 23 mg/dL   Creatinine, Ser 0.90 0.40 - 1.20 mg/dL   Total Bilirubin 0.6 0.2 - 1.2 mg/dL   Alkaline Phosphatase 68 39 - 117 U/L   AST 19 0 - 37 U/L   ALT 14 0 - 35 U/L   Total Protein 7.2 6.0 - 8.3 g/dL   Albumin 4.2 3.5 - 5.2 g/dL   Calcium 9.9 8.4 - 10.5 mg/dL   GFR 66.15 >60.00 mL/min  Hemoglobin A1c  Result Value Ref Range   Hgb A1c MFr Bld 5.9 4.6 - 6.5 %  Lipid panel  Result Value Ref Range   Cholesterol 192 0 - 200 mg/dL   Triglycerides 69.0 0.0 - 149.0 mg/dL   HDL 53.80 >39.00 mg/dL   VLDL 13.8 0.0 - 40.0 mg/dL   LDL Cholesterol 124 (H) 0 - 99 mg/dL   Total CHOL/HDL Ratio 4    NonHDL 137.86   TSH  Result Value Ref Range   TSH 1.71 0.35 - 4.50 uIU/mL  Hepatitis C antibody  Result Value Ref Range     HCV Ab NEGATIVE NEGATIVE     Patient Active Problem List   Diagnosis Date Noted  . Grief reaction 12/13/2015  . Routine general medical examination at a health care facility 11/19/2015  . Estrogen deficiency 04/01/2015  . Cerebral infarction due to thrombosis of right middle cerebral artery ([place]) 12/17/2014  . Encounter for Medicare annual wellness exam 11/16/2014  . Hyperglycemia 11/16/2014  . CVA (cerebral infarction) 10/03/2014  . Essential hypertension 10/03/2014  . Obesity 09/21/2011  . Osteopenia 03/12/2009  . Hyperlipidemia 12/13/2006  . ALLERGIC RHINITIS 12/13/2006  . GERD 12/13/2006  . OSTEOARTHRITIS 12/13/2006   Past Medical History:  Diagnosis Date  . Allergy    allergic rhinitis  . Arthritis    OA  . Fibrocystic breast   . GERD (gastroesophageal reflux disease)   . Hyperlipidemia   . Obesity   . Osteopenia   . Stroke [facility])    cerebral infarction  . Tibia fracture 2012   playing golf    Past Surgical History:  Procedure Laterality Date  . BREAST CYST ASPIRATION  1980s   aspirated breast lump  . COLONOSCOPY  2012  . ESOPHAGOGASTRODUODENOSCOPY ENDOSCOPY  09/18/14  . INGUINAL HERNIA REPAIR Right 1956  . TUBAL LIGATION  1976   lap   Social History  Substance Use Topics  . Smoking status: Never Smoker  . Smokeless tobacco: Never Used  . Alcohol use 1.8 oz/week    3 Glasses of wine per week     Comment: occ-wine   Family History  Problem Relation Age of Onset  . Arthritis Mother   . Hyperlipidemia Mother   . Hypertension Mother   . COPD Mother   . Asthma Mother   . Stroke Father   . Heart disease Father   . Pancreatic cancer Maternal Grandfather   . Heart disease Maternal Grandfather   . Arthritis Maternal Grandmother   . Hypertension Maternal Grandmother   . Stroke Maternal Grandmother   . Colon cancer Neg Hx   . Esophageal  cancer Neg Hx   . Rectal cancer Neg Hx   . Stomach cancer Neg Hx    Allergies  Allergen Reactions  . Buspar  [Buspirone]     nausea  . Lipitor [Atorvastatin] Other (See Comments)    Muscle pain   . Tetanus Toxoid Hives  . Wellbutrin [Bupropion] Hives  . Acetaminophen Rash  . Septra [Sulfamethoxazole-Trimethoprim] Rash    Body rash   Current Outpatient Prescriptions on File Prior to Visit  Medication Sig Dispense Refill  . aspirin EC 325 MG tablet Take 1 tablet (325 mg total) by mouth daily. 90 tablet 3  . buPROPion (WELLBUTRIN XL) 150 MG 24 hr tablet     . Calcium Carbonate-Vit D-Min 600-400 MG-UNIT TABS Take 2 tablets by mouth daily.      . fexofenadine (ALLEGRA) 180 MG tablet Take 180 mg by mouth daily as needed (during allergy season).     . fluticasone (FLONASE) 50 MCG/ACT nasal spray Place 2 sprays into both nostrils daily as needed for allergies or rhinitis.    . Multiple Vitamin (MULTIVITAMIN) tablet Take 1 tablet by mouth daily.      . Omega-3 Fatty Acids (FISH OIL PO) Take 1 capsule by mouth daily.     No current facility-administered medications on file prior to visit.     Review of Systems Review of Systems  Constitutional: Negative for fever, appetite change, fatigue and unexpected weight change.  Eyes: Negative for pain and visual disturbance.  Respiratory: Negative for cough and shortness of breath.   Cardiovascular: Negative for cp or palpitations    Gastrointestinal: Negative for nausea, diarrhea and constipation.  Genitourinary: Negative for urgency and frequency.  Skin: Negative for pallor or rash   Neurological: Negative for weakness, light-headedness, numbness and headaches.  Hematological: Negative for adenopathy. Does not bruise/bleed easily.  Psychiatric/Behavioral: Negative for dysphoric mood. The patient is not nervous/anxious.  pos for grief reaction        Objective:   Physical Exam  Constitutional: She appears well-developed and well-nourished. No distress.  obese and well appearing   HENT:  Head: Normocephalic and atraumatic.  Right Ear: External ear  normal.  Left Ear: External ear normal.  Mouth/Throat: Oropharynx is clear and moist.  Eyes: Conjunctivae and EOM are normal. Pupils are equal, round, and reactive to light. No scleral icterus.  Neck: Normal range of motion. Neck supple. No JVD present. Carotid bruit is not present. No thyromegaly present.  Cardiovascular: Normal rate, regular rhythm, normal heart sounds and intact distal pulses.  Exam reveals no gallop.   Pulmonary/Chest: Effort normal and breath sounds normal. No respiratory distress. She has no wheezes. She exhibits no tenderness.  Abdominal: Soft. Bowel sounds are normal. She exhibits no distension, no abdominal bruit and no mass. There is no tenderness.  Genitourinary: No breast swelling, tenderness, discharge or bleeding.  Genitourinary Comments: Breast exam: No mass, nodules, thickening, tenderness, bulging, retraction, inflamation, nipple discharge or skin changes noted.  No axillary or clavicular LA.      Musculoskeletal: Normal range of motion. She exhibits no edema or tenderness.  Lymphadenopathy:    She has no cervical adenopathy.  Neurological: She is alert. She has normal reflexes. No cranial nerve deficit. She exhibits normal muscle tone. Coordination normal.  Skin: Skin is warm and dry. No rash noted. No erythema. No pallor.  Lentigines diffusely  Psychiatric: She has a normal mood and affect.          Assessment & Plan:   Problem  List Items Addressed This Visit      Cardiovascular and Mediastinum   Essential hypertension - Primary    bp in fair control at this time  BP Readings from Last 1 Encounters:  11/25/16 118/74   No changes needed Disc lifstyle change with low sodium diet and exercise  Labs reviewed       Relevant Medications   hydrochlorothiazide (HYDRODIURIL) 25 MG tablet     Nervous and Auditory   Cerebral infarction due to thrombosis of right middle cerebral artery (HCC)    No further symptoms  On asa Disc goals for LDL chol/  bp and A1C         Musculoskeletal and Integument   Osteopenia    Rev dexa 11/16 No falls or recent fractures Disc need for calcium/ vitamin D/ wt bearing exercise and bone density test every 2 y to monitor Disc safety/ fracture risk in detail          Other   Grief reaction    Doing ok overall-after loss of son a year ago Declines counseling       Hyperglycemia    Lab Results  Component Value Date   HGBA1C 5.9 11/23/2016   This is stable/controlled  disc imp of low glycemic diet and wt loss to prevent DM2       Hyperlipidemia    Disc goals for lipids and reasons to control them Rev labs with pt Rev low sat fat diet in detail Intol of statin  Not at goal for CVA pt  Given info on PCYK9 inhibitors- disc ref to lipid clinic-she would consider that  Rev diet       Relevant Medications   hydrochlorothiazide (HYDRODIURIL) 25 MG tablet   Obesity    Discussed how this problem influences overall health and the risks it imposes  Reviewed plan for weight loss with lower calorie diet (via better food choices and also portion control or program like weight watchers) and exercise building up to or more than 30 minutes 5 days per week including some aerobic activity         Routine general medical examination at a health care facility    Reviewed health habits including diet and exercise and skin cancer prevention Reviewed appropriate screening tests for age  Also reviewed health mt list, fam hx and immunization status , as well as social and family history   See HPI Rev AMW visit = she does not want a hearing aide/disc grief reaction  Declines tetanus vaccine  Interested in Shingrix if affordable

## 2016-11-25 NOTE — Progress Notes (Signed)
Pre visit review using our clinic review tool, if applicable. No additional management support is needed unless otherwise documented below in the visit note. 

## 2016-11-25 NOTE — Patient Instructions (Addendum)
If you are interested in the new shingles vaccine (If you are interested in a shingles/zoster vaccine - call your insurance to check on coverage,( you should not get it within 1 month of other vaccines) , then call us for a prescription  for it to take to a pharmacy that gives the shot , or make a nurse visit to get it here depending on your coverage)   Here is a handout on new class of cholesterol medicines (PCYK9 inhibitor)- if you would like to see a cardiologist to discuss it please let me know   Take care of yourself and stay active

## 2016-11-26 NOTE — Assessment & Plan Note (Signed)
Reviewed health habits including diet and exercise and skin cancer prevention Reviewed appropriate screening tests for age  Also reviewed health mt list, fam hx and immunization status , as well as social and family history   See HPI Rev AMW visit = she does not want a hearing aide/disc grief reaction  Declines tetanus vaccine  Interested in Shingrix if affordable

## 2016-11-26 NOTE — Assessment & Plan Note (Signed)
Lab Results  Component Value Date   HGBA1C 5.9 11/23/2016   This is stable/controlled  disc imp of low glycemic diet and wt loss to prevent DM2

## 2016-11-26 NOTE — Assessment & Plan Note (Signed)
Discussed how this problem influences overall health and the risks it imposes  Reviewed plan for weight loss with lower calorie diet (via better food choices and also portion control or program like weight watchers) and exercise building up to or more than 30 minutes 5 days per week including some aerobic activity    

## 2016-11-26 NOTE — Assessment & Plan Note (Signed)
Rev dexa 11/16 No falls or recent fractures Disc need for calcium/ vitamin D/ wt bearing exercise and bone density test every 2 y to monitor Disc safety/ fracture risk in detail

## 2016-11-26 NOTE — Assessment & Plan Note (Signed)
Doing ok overall-after loss of son a year ago Declines counseling

## 2016-11-26 NOTE — Assessment & Plan Note (Signed)
bp in fair control at this time  BP Readings from Last 1 Encounters:  11/25/16 118/74   No changes needed Disc lifstyle change with low sodium diet and exercise  Labs reviewed

## 2016-11-26 NOTE — Assessment & Plan Note (Signed)
No further symptoms  On asa Disc goals for LDL chol/ bp and A1C

## 2016-11-26 NOTE — Assessment & Plan Note (Signed)
Disc goals for lipids and reasons to control them Rev labs with pt Rev low sat fat diet in detail Intol of statin  Not at goal for CVA pt  Given info on PCYK9 inhibitors- disc ref to lipid clinic-she would consider that  Rev diet

## 2016-12-01 DIAGNOSIS — M189 Osteoarthritis of first carpometacarpal joint, unspecified: Secondary | ICD-10-CM | POA: Diagnosis not present

## 2016-12-25 DIAGNOSIS — H53121 Transient visual loss, right eye: Secondary | ICD-10-CM | POA: Diagnosis not present

## 2017-02-17 DIAGNOSIS — L249 Irritant contact dermatitis, unspecified cause: Secondary | ICD-10-CM | POA: Diagnosis not present

## 2017-04-01 ENCOUNTER — Ambulatory Visit (INDEPENDENT_AMBULATORY_CARE_PROVIDER_SITE_OTHER): Payer: Medicare HMO | Admitting: Internal Medicine

## 2017-04-01 ENCOUNTER — Encounter: Payer: Self-pay | Admitting: Internal Medicine

## 2017-04-01 VITALS — BP 126/82 | HR 81 | Temp 98.5°F | Wt 161.8 lb

## 2017-04-01 DIAGNOSIS — H60391 Other infective otitis externa, right ear: Secondary | ICD-10-CM

## 2017-04-01 MED ORDER — CIPROFLOXACIN-DEXAMETHASONE 0.3-0.1 % OT SUSP: 4.0000 [drp] | Freq: Two times a day (BID) | OTIC | 0 refills | Status: DC

## 2017-04-01 NOTE — Patient Instructions (Signed)
Otitis Externa Otitis externa is an infection of the outer ear canal. The outer ear canal is the area between the outside of the ear and the eardrum. Otitis externa is sometimes called "swimmer's ear." Follow these instructions at home:  If you were given antibiotic ear drops, use them as told by your doctor. Do not stop using them even if your condition gets better.  Take over-the-counter and prescription medicines only as told by your doctor.  Keep all follow-up visits as told by your doctor. This is important. How is this prevented?  Keep your ear dry. Use the corner of a towel to dry your ear after you swim or bathe.  Try not to scratch or put things in your ear. Doing these things makes it easier for germs to grow in your ear.  Avoid swimming in lakes, dirty water, or pools that may not have the right amount of a chemical called chlorine.  Consider making ear drops and putting 3 or 4 drops in each ear after you swim. Ask your doctor about how you can make ear drops. Contact a doctor if:  You have a fever.  After 3 days your ear is still red, swollen, or painful.  After 3 days you still have pus coming from your ear.  Your redness, swelling, or pain gets worse.  You have a really bad headache.  You have redness, swelling, pain, or tenderness behind your ear. This information is not intended to replace advice given to you by your health care provider. Make sure you discuss any questions you have with your health care provider. Document Released: 01/06/2008 Document Revised: 08/15/2015 Document Reviewed: 04/29/2015 Elsevier Interactive Patient Education  2018 Elsevier Inc.  

## 2017-04-01 NOTE — Progress Notes (Signed)
Subjective:    Patient ID: Brianna Espinoza, female    DOB: Jan 04, 1951, 66 y.o.   MRN: [id number]  HPI  Pt presents to the clinic today with c/o right ear pain. She reports this started last night. She describes the pain as burning and pressure. She reports she has associated popping in her right ear. The pain radiates to the right side of her throat. She denies loss of hearing. She denies runny nose, nasal congestion, sore throat or cough. She denies fever, chills or body aches. She denies trauma to the ear. She has not tried anything OTC for this.  Review of Systems      Past Medical History:  Diagnosis Date  . Allergy    allergic rhinitis  . Arthritis    OA  . Fibrocystic breast   . GERD (gastroesophageal reflux disease)   . Hyperlipidemia   . Obesity   . Osteopenia   . Stroke [facility])    cerebral infarction  . Tibia fracture 2012   playing golf     Current Outpatient Prescriptions  Medication Sig Dispense Refill  . aspirin EC 325 MG tablet Take 1 tablet (325 mg total) by mouth daily. 90 tablet 3  . Calcium Carbonate-Vit D-Min 600-400 MG-UNIT TABS Take 2 tablets by mouth daily.      . hydrochlorothiazide (HYDRODIURIL) 25 MG tablet Take 1 tablet (25 mg total) by mouth daily. 90 tablet 3  . Multiple Vitamin (MULTIVITAMIN) tablet Take 1 tablet by mouth daily.      . Omega-3 Fatty Acids (FISH OIL PO) Take 1 capsule by mouth daily.    Marland Kitchen omeprazole (PRILOSEC) 20 MG capsule Take 1 capsule (20 mg total) by mouth every other day. 45 capsule 3  . fexofenadine (ALLEGRA) 180 MG tablet Take 180 mg by mouth daily as needed (during allergy season).     . fluticasone (FLONASE) 50 MCG/ACT nasal spray Place 2 sprays into both nostrils daily as needed for allergies or rhinitis.     No current facility-administered medications for this visit.     Allergies  Allergen Reactions  . Buspar [Buspirone]     nausea  . Lipitor [Atorvastatin] Other (See Comments)    Muscle pain   . Tetanus  Toxoid Hives  . Wellbutrin [Bupropion] Hives  . Acetaminophen Rash  . Septra [Sulfamethoxazole-Trimethoprim] Rash    Body rash    Family History  Problem Relation Age of Onset  . Arthritis Mother   . Hyperlipidemia Mother   . Hypertension Mother   . COPD Mother   . Asthma Mother   . Stroke Father   . Heart disease Father   . Pancreatic cancer Maternal Grandfather   . Heart disease Maternal Grandfather   . Arthritis Maternal Grandmother   . Hypertension Maternal Grandmother   . Stroke Maternal Grandmother   . Colon cancer Neg Hx   . Esophageal cancer Neg Hx   . Rectal cancer Neg Hx   . Stomach cancer Neg Hx     Social History   Social History  . Marital status: Married    Spouse name: N/A  . Number of children: 1  . Years of education: 29   Occupational History  . retired    Social History Main Topics  . Smoking status: Never Smoker  . Smokeless tobacco: Never Used  . Alcohol use 1.8 oz/week    3 Glasses of wine per week     Comment: occ-wine  . Drug use: No  .  Sexual activity: Not on file   Other Topics Concern  . Not on file   Social History Narrative   Patient is married with one child.   Patient is left handed.   Patient has 14 yrs of education.   Patient drinks 1 and 1/2 cup daily.     Constitutional: Denies fever, malaise, fatigue, headache or abrupt weight changes.  HEENT: Pt reports ear pain. Denies eye pain, eye redness, ringing in the ears, wax buildup, runny nose, nasal congestion, bloody nose, or sore throat. Respiratory: Denies difficulty breathing, shortness of breath, cough or sputum production.    No other specific complaints in a complete review of systems (except as listed in HPI above).  Objective:   Physical Exam   BP 126/82   Pulse 81   Temp 98.5 F (36.9 C) (Oral)   Wt 161 lb 12 oz (73.4 kg)   SpO2 97%   BMI 30.56 kg/m  Wt Readings from Last 3 Encounters:  04/01/17 161 lb 12 oz (73.4 kg)  11/25/16 158 lb 12 oz (72 kg)    11/23/16 158 lb 12 oz (72 kg)    General: Appears her stated age, in NAD. HEENT: Right Ear: Pain noted with palpation of the tragus and pulling up on the pinna. Canal swollen and erythematous. No pus noted in the ear canal. Unable to visualize TM due to swelling.  Neck: Anterior cervical adenopathy noted on the right.  BMET    Component Value Date/Time   NA 140 11/23/2016 0830   K 3.8 11/23/2016 0830   CL 102 11/23/2016 0830   CO2 31 11/23/2016 0830   GLUCOSE 99 11/23/2016 0830   BUN 19 11/23/2016 0830   CREATININE 0.90 11/23/2016 0830   CALCIUM 9.9 11/23/2016 0830    Lipid Panel     Component Value Date/Time   CHOL 192 11/23/2016 0830   TRIG 69.0 11/23/2016 0830   HDL 53.80 11/23/2016 0830   CHOLHDL 4 11/23/2016 0830   VLDL 13.8 11/23/2016 0830   LDLCALC 124 (H) 11/23/2016 0830    CBC    Component Value Date/Time   WBC 9.1 11/23/2016 0830   RBC 4.68 11/23/2016 0830   HGB 14.3 11/23/2016 0830   HCT 42.2 11/23/2016 0830   PLT 275.0 11/23/2016 0830   MCV 90.1 11/23/2016 0830   MCHC 33.8 11/23/2016 0830   RDW 13.7 11/23/2016 0830   LYMPHSABS 2.2 11/23/2016 0830   MONOABS 0.5 11/23/2016 0830   EOSABS 0.2 11/23/2016 0830   BASOSABS 0.1 11/23/2016 0830    Hgb A1C Lab Results  Component Value Date   HGBA1C 5.9 11/23/2016           Assessment & Plan:   Otitis Externa:  eRx for Ciprodex Otic drops, BID x 7 days Ibuprofen as needed for pain  Return precautions discussed Webb Silversmith, NP

## 2017-04-16 DIAGNOSIS — N390 Urinary tract infection, site not specified: Secondary | ICD-10-CM | POA: Diagnosis not present

## 2017-05-10 ENCOUNTER — Other Ambulatory Visit: Payer: Self-pay | Admitting: Family Medicine

## 2017-05-10 DIAGNOSIS — Z1231 Encounter for screening mammogram for malignant neoplasm of breast: Secondary | ICD-10-CM

## 2017-05-13 DIAGNOSIS — M5416 Radiculopathy, lumbar region: Secondary | ICD-10-CM | POA: Diagnosis not present

## 2017-05-13 DIAGNOSIS — M9905 Segmental and somatic dysfunction of pelvic region: Secondary | ICD-10-CM | POA: Diagnosis not present

## 2017-05-13 DIAGNOSIS — M955 Acquired deformity of pelvis: Secondary | ICD-10-CM | POA: Diagnosis not present

## 2017-05-13 DIAGNOSIS — M9903 Segmental and somatic dysfunction of lumbar region: Secondary | ICD-10-CM | POA: Diagnosis not present

## 2017-05-14 ENCOUNTER — Ambulatory Visit: Payer: Medicare HMO

## 2017-05-17 DIAGNOSIS — M9903 Segmental and somatic dysfunction of lumbar region: Secondary | ICD-10-CM | POA: Diagnosis not present

## 2017-05-17 DIAGNOSIS — M5416 Radiculopathy, lumbar region: Secondary | ICD-10-CM | POA: Diagnosis not present

## 2017-05-17 DIAGNOSIS — M5432 Sciatica, left side: Secondary | ICD-10-CM | POA: Diagnosis not present

## 2017-05-17 DIAGNOSIS — M9905 Segmental and somatic dysfunction of pelvic region: Secondary | ICD-10-CM | POA: Diagnosis not present

## 2017-06-08 ENCOUNTER — Ambulatory Visit (INDEPENDENT_AMBULATORY_CARE_PROVIDER_SITE_OTHER): Payer: Medicare HMO

## 2017-06-08 DIAGNOSIS — Z23 Encounter for immunization: Secondary | ICD-10-CM | POA: Diagnosis not present

## 2017-06-16 ENCOUNTER — Ambulatory Visit
Admission: RE | Admit: 2017-06-16 | Discharge: 2017-06-16 | Disposition: A | Discharge status: Home / Self Care | Payer: Medicare HMO | Source: Ambulatory Visit | Attending: Family Medicine | Admitting: Family Medicine

## 2017-06-16 DIAGNOSIS — Z1231 Encounter for screening mammogram for malignant neoplasm of breast: Secondary | ICD-10-CM | POA: Diagnosis not present

## 2017-07-16 ENCOUNTER — Encounter: Payer: Self-pay | Admitting: Internal Medicine

## 2017-07-16 ENCOUNTER — Ambulatory Visit: Payer: Medicare HMO | Admitting: Internal Medicine

## 2017-07-16 VITALS — BP 122/78 | HR 81 | Temp 98.5°F | Wt 160.0 lb

## 2017-07-16 DIAGNOSIS — J329 Chronic sinusitis, unspecified: Secondary | ICD-10-CM | POA: Diagnosis not present

## 2017-07-16 DIAGNOSIS — B9789 Other viral agents as the cause of diseases classified elsewhere: Secondary | ICD-10-CM | POA: Diagnosis not present

## 2017-07-16 NOTE — Progress Notes (Signed)
HPI  Pt comes to the clinic today with c/o headache, facial pain and pressure, ear fullness, nasal congestion and cough. This started 4 days ago. She is blowing white mucous out of her nose. She denies ear pain or decreased hearing. The cough is non productive. She denies fever, chills or body aches. She is using nasal saline, and steam with some relief. She has not had sick contacts.   Review of Systems     Past Medical History:  Diagnosis Date  . Allergy    allergic rhinitis  . Arthritis    OA  . Fibrocystic breast   . GERD (gastroesophageal reflux disease)   . Hyperlipidemia   . Obesity   . Osteopenia   . Stroke [facility])    cerebral infarction  . Tibia fracture 2012   playing golf     Family History  Problem Relation Age of Onset  . Arthritis Mother   . Hyperlipidemia Mother   . Hypertension Mother   . COPD Mother   . Asthma Mother   . Stroke Father   . Heart disease Father   . Pancreatic cancer Maternal Grandfather   . Heart disease Maternal Grandfather   . Arthritis Maternal Grandmother   . Hypertension Maternal Grandmother   . Stroke Maternal Grandmother   . Colon cancer Neg Hx   . Esophageal cancer Neg Hx   . Rectal cancer Neg Hx   . Stomach cancer Neg Hx     Social History   Socioeconomic History  . Marital status: Married    Spouse name: Not on file  . Number of children: 1  . Years of education: 62  . Highest education level: Not on file  Social Needs  . Financial resource strain: Not on file  . Food insecurity - worry: Not on file  . Food insecurity - inability: Not on file  . Transportation needs - medical: Not on file  . Transportation needs - non-medical: Not on file  Occupational History  . Occupation: retired  Tobacco Use  . Smoking status: Never Smoker  . Smokeless tobacco: Never Used  Substance and Sexual Activity  . Alcohol use: Yes    Alcohol/week: 1.8 oz    Types: 3 Glasses of wine per week    Comment: occ-wine  . Drug use: No  .  Sexual activity: Not on file  Other Topics Concern  . Not on file  Social History Narrative   Patient is married with one child.   Patient is left handed.   Patient has 14 yrs of education.   Patient drinks 1 and 1/2 cup daily.    Allergies  Allergen Reactions  . Buspar [Buspirone]     nausea  . Lipitor [Atorvastatin] Other (See Comments)    Muscle pain   . Tetanus Toxoid Hives  . Wellbutrin [Bupropion] Hives  . Acetaminophen Rash  . Septra [Sulfamethoxazole-Trimethoprim] Rash    Body rash     Constitutional: Positive headache. Denies fatigue, fever or abrupt weight changes.  HEENT:  Positive facial pain, nasal congestion and sore throat. Denies eye redness, ear pain, ringing in the ears, wax buildup, runny nose or bloody nose. Respiratory: Positive cough. Denies difficulty breathing or shortness of breath.  Cardiovascular: Denies chest pain, chest tightness, palpitations or swelling in the hands or feet.   No other specific complaints in a complete review of systems (except as listed in HPI above).  Objective:   BP 122/78   Pulse 81   Temp 98.5  F (36.9 C) (Oral)   Wt 160 lb (72.6 kg)   SpO2 98%   BMI 30.23 kg/m   General: Appears her stated age, well developed, well nourished in NAD. HEENT: Head: normal shape and size, maxillary sinus tenderness noted;  Ears: Tm's gray and intact, normal light reflex; Nose: mucosa pink and moist, septum midline; Throat/Mouth: + PND. Teeth present, mucosa pink and moist, no exudate noted, no lesions or ulcerations noted.  Neck:  No adenopathy noted.  Cardiovascular: Normal rate and rhythm. S1,S2 noted.  No murmur, rubs or gallops noted.  Pulmonary/Chest: Normal effort and positive vesicular breath sounds. No respiratory distress. No wheezes, rales or ronchi noted.       Assessment & Plan:   Viral Sinusitis:  Can use a Neti Pot which can be purchased from your local drug store. Flonase 2 sprays each nostril for 3 days and then as  needed. Start Allegra OTC She declines 80 mg Depo IM today  RTC as needed or if symptoms persist. Webb Silversmith, NP

## 2017-07-16 NOTE — Patient Instructions (Signed)

## 2017-07-17 ENCOUNTER — Encounter: Payer: Self-pay | Admitting: Internal Medicine

## 2017-07-19 MED ORDER — FLUTICASONE PROPIONATE 50 MCG/ACT NA SUSP: 2.0000 | Freq: Every day | NASAL | 1 refills | Status: DC | PRN

## 2017-08-05 DIAGNOSIS — L570 Actinic keratosis: Secondary | ICD-10-CM | POA: Diagnosis not present

## 2017-08-05 DIAGNOSIS — L578 Other skin changes due to chronic exposure to nonionizing radiation: Secondary | ICD-10-CM | POA: Diagnosis not present

## 2017-08-05 DIAGNOSIS — L814 Other melanin hyperpigmentation: Secondary | ICD-10-CM | POA: Diagnosis not present

## 2017-08-05 DIAGNOSIS — D485 Neoplasm of uncertain behavior of skin: Secondary | ICD-10-CM | POA: Diagnosis not present

## 2017-08-05 DIAGNOSIS — Z86018 Personal history of other benign neoplasm: Secondary | ICD-10-CM | POA: Diagnosis not present

## 2017-08-05 DIAGNOSIS — L821 Other seborrheic keratosis: Secondary | ICD-10-CM | POA: Diagnosis not present

## 2017-08-17 DIAGNOSIS — D485 Neoplasm of uncertain behavior of skin: Secondary | ICD-10-CM | POA: Diagnosis not present

## 2017-08-17 DIAGNOSIS — C44329 Squamous cell carcinoma of skin of other parts of face: Secondary | ICD-10-CM | POA: Diagnosis not present

## 2017-08-24 DIAGNOSIS — H903 Sensorineural hearing loss, bilateral: Secondary | ICD-10-CM | POA: Diagnosis not present

## 2017-09-01 DIAGNOSIS — C44329 Squamous cell carcinoma of skin of other parts of face: Secondary | ICD-10-CM | POA: Diagnosis not present

## 2017-09-08 ENCOUNTER — Encounter: Payer: Self-pay | Admitting: Family Medicine

## 2017-09-09 MED ORDER — LOVASTATIN 20 MG PO TABS
20.0000 mg | ORAL_TABLET | Freq: Every day | ORAL | 11 refills | Status: DC
Start: 2017-09-09 — End: 2017-12-02

## 2017-09-13 DIAGNOSIS — L905 Scar conditions and fibrosis of skin: Secondary | ICD-10-CM | POA: Diagnosis not present

## 2017-09-30 ENCOUNTER — Ambulatory Visit: Payer: Medicare HMO | Admitting: Family Medicine

## 2017-09-30 ENCOUNTER — Encounter: Payer: Self-pay | Admitting: Family Medicine

## 2017-09-30 VITALS — BP 122/68 | HR 93 | Temp 98.7°F | Ht 61.0 in | Wt 158.8 lb

## 2017-09-30 DIAGNOSIS — R319 Hematuria, unspecified: Secondary | ICD-10-CM | POA: Diagnosis not present

## 2017-09-30 DIAGNOSIS — N3001 Acute cystitis with hematuria: Secondary | ICD-10-CM | POA: Diagnosis not present

## 2017-09-30 DIAGNOSIS — N3000 Acute cystitis without hematuria: Secondary | ICD-10-CM | POA: Insufficient documentation

## 2017-09-30 LAB — POC URINALSYSI DIPSTICK (AUTOMATED)
Bilirubin, UA: NEGATIVE
Blood, UA: 200
Glucose, UA: NEGATIVE
Ketones, UA: NEGATIVE
NITRITE UA: NEGATIVE
PH UA: 6 (ref 5.0–8.0)
Spec Grav, UA: 1.015 (ref 1.010–1.025)
Urobilinogen, UA: 0.2 E.U./dL

## 2017-09-30 MED ORDER — CEPHALEXIN 250 MG PO CAPS: 250.0000 mg | ORAL_CAPSULE | Freq: Two times a day (BID) | ORAL | 0 refills | Status: DC

## 2017-09-30 NOTE — Assessment & Plan Note (Signed)
With pos UA  tx with keflex 250 mg bid  Culture pending  Inc fluids Handout given  Update if not starting to improve in several days or if worsening

## 2017-09-30 NOTE — Patient Instructions (Addendum)
Take the keflex as directed  Drink fluids and rest  Update if not starting to improve in several days or if worsening   We will call with urine culture result    Urinary Tract Infection, Adult A urinary tract infection (UTI) is an infection of any part of the urinary tract, which includes the kidneys, ureters, bladder, and urethra. These organs make, store, and get rid of urine in the body. UTI can be a bladder infection (cystitis) or kidney infection (pyelonephritis). What are the causes? This infection may be caused by fungi, viruses, or bacteria. Bacteria are the most common cause of UTIs. This condition can also be caused by repeated incomplete emptying of the bladder during urination. What increases the risk? This condition is more likely to develop if:  You ignore your need to urinate or hold urine for long periods of time.  You do not empty your bladder completely during urination.  You wipe back to front after urinating or having a bowel movement, if you are female.  You are uncircumcised, if you are female.  You are constipated.  You have a urinary catheter that stays in place (indwelling).  You have a weak defense (immune) system.  You have a medical condition that affects your bowels, kidneys, or bladder.  You have diabetes.  You take antibiotic medicines frequently or for long periods of time, and the antibiotics no longer work well against certain types of infections (antibiotic resistance).  You take medicines that irritate your urinary tract.  You are exposed to chemicals that irritate your urinary tract.  You are female.  What are the signs or symptoms? Symptoms of this condition include:  Fever.  Frequent urination or passing small amounts of urine frequently.  Needing to urinate urgently.  Pain or burning with urination.  Urine that smells bad or unusual.  Cloudy urine.  Pain in the lower abdomen or back.  Trouble urinating.  Blood in the  urine.  Vomiting or being less hungry than normal.  Diarrhea or abdominal pain.  Vaginal discharge, if you are female.  How is this diagnosed? This condition is diagnosed with a medical history and physical exam. You will also need to provide a urine sample to test your urine. Other tests may be done, including:  Blood tests.  Sexually transmitted disease (STD) testing.  If you have had more than one UTI, a cystoscopy or imaging studies may be done to determine the cause of the infections. How is this treated? Treatment for this condition often includes a combination of two or more of the following:  Antibiotic medicine.  Other medicines to treat less common causes of UTI.  Over-the-counter medicines to treat pain.  Drinking enough water to stay hydrated.  Follow these instructions at home:  Take over-the-counter and prescription medicines only as told by your health care provider.  If you were prescribed an antibiotic, take it as told by your health care provider. Do not stop taking the antibiotic even if you start to feel better.  Avoid alcohol, caffeine, tea, and carbonated beverages. They can irritate your bladder.  Drink enough fluid to keep your urine clear or pale yellow.  Keep all follow-up visits as told by your health care provider. This is important.  Make sure to: ? Empty your bladder often and completely. Do not hold urine for long periods of time. ? Empty your bladder before and after sex. ? Wipe from front to back after a bowel movement if you are female.  Use each tissue one time when you wipe. Contact a health care provider if:  You have back pain.  You have a fever.  You feel nauseous or vomit.  Your symptoms do not get better after 3 days.  Your symptoms go away and then return. Get help right away if:  You have severe back pain or lower abdominal pain.  You are vomiting and cannot keep down any medicines or water. This information is not  intended to replace advice given to you by your health care provider. Make sure you discuss any questions you have with your health care provider. Document Released: 04/29/2005 Document Revised: 01/01/2016 Document Reviewed: 06/10/2015 Elsevier Interactive Patient Education  [employer].

## 2017-09-30 NOTE — Progress Notes (Signed)
Subjective:    Patient ID: Brianna Espinoza, female    DOB: 08/25/1950, 67 y.o.   MRN: [id number]  HPI  Here for urinary symptoms  Monday -symptoms started in pm- a twinge when she urinated  Last night it was worse-woke her up /bladder discomfort  Then a pink color to urine with wiping   Frequency and urgency with pressure   No fever  No n/v  Really tired last night     Ua is pos for blood and leukocytes Results for orders placed or performed in visit on 09/30/17  POCT Urinalysis Dipstick (Automated)  Result Value Ref Range   Color, UA Amber    Clarity, UA Cloudy    Glucose, UA Negative    Bilirubin, UA Negative    Ketones, UA Negative    Spec Grav, UA 1.015 1.010 - 1.025   Blood, UA 200 Ery/uL    pH, UA 6.0 5.0 - 8.0   Protein, UA 15 mg/dL    Urobilinogen, UA 0.2 0.2 or 1.0 E.U./dL   Nitrite, UA Negative    Leukocytes, UA Moderate (2+) (A) Negative      Patient Active Problem List   Diagnosis Date Noted  . Acute cystitis 09/30/2017  . Grief reaction 12/13/2015  . Routine general medical examination at a health care facility 11/19/2015  . Estrogen deficiency 04/01/2015  . Cerebral infarction due to thrombosis of right middle cerebral artery ([place]) 12/17/2014  . Encounter for Medicare annual wellness exam 11/16/2014  . Hyperglycemia 11/16/2014  . CVA (cerebral infarction) 10/03/2014  . Essential hypertension 10/03/2014  . Obesity 09/21/2011  . Osteopenia 03/12/2009  . Hyperlipidemia 12/13/2006  . ALLERGIC RHINITIS 12/13/2006  . GERD 12/13/2006  . OSTEOARTHRITIS 12/13/2006   Past Medical History:  Diagnosis Date  . Allergy    allergic rhinitis  . Arthritis    OA  . Fibrocystic breast   . GERD (gastroesophageal reflux disease)   . Hyperlipidemia   . Obesity   . Osteopenia   . Stroke [facility])    cerebral infarction  . Tibia fracture 2012   playing golf    Past Surgical History:  Procedure Laterality Date  . BREAST CYST ASPIRATION  1980s   aspirated  breast lump  . COLONOSCOPY  2012  . ESOPHAGOGASTRODUODENOSCOPY ENDOSCOPY  09/18/14  . INGUINAL HERNIA REPAIR Right 1956  . TUBAL LIGATION  1976   lap   Social History   Tobacco Use  . Smoking status: Never Smoker  . Smokeless tobacco: Never Used  Substance Use Topics  . Alcohol use: Yes    Alcohol/week: 1.8 oz    Types: 3 Glasses of wine per week    Comment: occ-wine  . Drug use: No   Family History  Problem Relation Age of Onset  . Arthritis Mother   . Hyperlipidemia Mother   . Hypertension Mother   . COPD Mother   . Asthma Mother   . Stroke Father   . Heart disease Father   . Pancreatic cancer Maternal Grandfather   . Heart disease Maternal Grandfather   . Arthritis Maternal Grandmother   . Hypertension Maternal Grandmother   . Stroke Maternal Grandmother   . Colon cancer Neg Hx   . Esophageal cancer Neg Hx   . Rectal cancer Neg Hx   . Stomach cancer Neg Hx    Allergies  Allergen Reactions  . Buspar [Buspirone]     nausea  . Lipitor [Atorvastatin] Other (See Comments)    Muscle pain   .  Tetanus Toxoid Hives  . Wellbutrin [Bupropion] Hives  . Acetaminophen Rash  . Septra [Sulfamethoxazole-Trimethoprim] Rash    Body rash   Current Outpatient Medications on File Prior to Visit  Medication Sig Dispense Refill  . aspirin EC 325 MG tablet Take 1 tablet (325 mg total) by mouth daily. 90 tablet 3  . Calcium Carbonate-Vit D-Min 600-400 MG-UNIT TABS Take 2 tablets by mouth daily.      . fexofenadine (ALLEGRA) 180 MG tablet Take 180 mg by mouth daily as needed (during allergy season).     . fluticasone (FLONASE) 50 MCG/ACT nasal spray Place 2 sprays into both nostrils daily as needed for allergies or rhinitis. 16 g 1  . hydrochlorothiazide (HYDRODIURIL) 25 MG tablet Take 1 tablet (25 mg total) by mouth daily. 90 tablet 3  . lovastatin (MEVACOR) 20 MG tablet Take 1 tablet (20 mg total) by mouth at bedtime. 30 tablet 11  . Multiple Vitamin (MULTIVITAMIN) tablet Take 1  tablet by mouth daily.      . Omega-3 Fatty Acids (FISH OIL PO) Take 1 capsule by mouth daily.    Marland Kitchen omeprazole (PRILOSEC) 20 MG capsule Take 1 capsule (20 mg total) by mouth every other day. 45 capsule 3   No current facility-administered medications on file prior to visit.      Review of Systems  Constitutional: Positive for fatigue. Negative for activity change, appetite change and fever.  HENT: Negative for congestion and sore throat.   Eyes: Negative for itching and visual disturbance.  Respiratory: Negative for cough and shortness of breath.   Cardiovascular: Negative for leg swelling.  Gastrointestinal: Negative for abdominal distention, abdominal pain, constipation, diarrhea and nausea.  Endocrine: Negative for cold intolerance and polydipsia.  Genitourinary: Positive for dysuria, frequency and urgency. Negative for difficulty urinating, flank pain and hematuria.  Musculoskeletal: Negative for myalgias.  Skin: Negative for rash.  Allergic/Immunologic: Negative for immunocompromised state.  Neurological: Negative for dizziness and weakness.  Hematological: Negative for adenopathy.       Objective:   Physical Exam  Constitutional: She appears well-developed and well-nourished. No distress.  Well appearing   HENT:  Head: Normocephalic and atraumatic.  Eyes: Conjunctivae and EOM are normal. Pupils are equal, round, and reactive to light.  Neck: Normal range of motion. Neck supple.  Cardiovascular: Normal rate, regular rhythm and normal heart sounds.  Pulmonary/Chest: Effort normal and breath sounds normal.  Abdominal: Soft. Bowel sounds are normal. She exhibits no distension. There is tenderness. There is no rebound.  No cva tenderness  Mild suprapubic tenderness  Musculoskeletal: She exhibits no edema.  Lymphadenopathy:    She has no cervical adenopathy.  Neurological: She is alert.  Skin: No rash noted.  Psychiatric: She has a normal mood and affect.            Assessment & Plan:   Problem List Items Addressed This Visit      Genitourinary   Acute cystitis - Primary    With pos UA  tx with keflex 250 mg bid  Culture pending  Inc fluids Handout given  Update if not starting to improve in several days or if worsening        Relevant Orders   Urine Culture    Other Visit Diagnoses    Hematuria, unspecified type       Relevant Orders   POCT Urinalysis Dipstick (Automated) (Completed)

## 2017-10-02 LAB — URINE CULTURE
MICRO NUMBER: 90262559
SPECIMEN QUALITY:: ADEQUATE

## 2017-10-20 DIAGNOSIS — H2513 Age-related nuclear cataract, bilateral: Secondary | ICD-10-CM | POA: Diagnosis not present

## 2017-11-12 DIAGNOSIS — L905 Scar conditions and fibrosis of skin: Secondary | ICD-10-CM | POA: Diagnosis not present

## 2017-11-12 DIAGNOSIS — L578 Other skin changes due to chronic exposure to nonionizing radiation: Secondary | ICD-10-CM | POA: Diagnosis not present

## 2017-11-23 ENCOUNTER — Telehealth: Payer: Self-pay | Admitting: Family Medicine

## 2017-11-23 DIAGNOSIS — I10 Essential (primary) hypertension: Secondary | ICD-10-CM

## 2017-11-23 DIAGNOSIS — E785 Hyperlipidemia, unspecified: Secondary | ICD-10-CM

## 2017-11-23 DIAGNOSIS — R739 Hyperglycemia, unspecified: Secondary | ICD-10-CM

## 2017-11-23 NOTE — Telephone Encounter (Signed)
-----   Message from Eustace Pen, LPN sent at [phone]/90/3833  5:05 PM EDT ----- Regarding: Labs 11/24/17 Lab orders needed. Thank you.  Insurance:  [employer]

## 2017-11-24 ENCOUNTER — Ambulatory Visit: Payer: Medicare HMO

## 2017-11-24 ENCOUNTER — Ambulatory Visit (INDEPENDENT_AMBULATORY_CARE_PROVIDER_SITE_OTHER): Payer: Medicare HMO

## 2017-11-24 VITALS — BP 118/82 | HR 72 | Temp 98.0°F | Ht 60.5 in | Wt 156.5 lb

## 2017-11-24 DIAGNOSIS — E785 Hyperlipidemia, unspecified: Secondary | ICD-10-CM | POA: Diagnosis not present

## 2017-11-24 DIAGNOSIS — Z0000 Encounter for general adult medical examination without abnormal findings: Secondary | ICD-10-CM

## 2017-11-24 DIAGNOSIS — I10 Essential (primary) hypertension: Secondary | ICD-10-CM | POA: Diagnosis not present

## 2017-11-24 DIAGNOSIS — R739 Hyperglycemia, unspecified: Secondary | ICD-10-CM | POA: Diagnosis not present

## 2017-11-24 LAB — COMPREHENSIVE METABOLIC PANEL
ALBUMIN: 4 g/dL (ref 3.5–5.2)
ALK PHOS: 68 U/L (ref 39–117)
ALT: 12 U/L (ref 0–35)
AST: 17 U/L (ref 0–37)
BILIRUBIN TOTAL: 0.6 mg/dL (ref 0.2–1.2)
BUN: 20 mg/dL (ref 6–23)
CALCIUM: 9.5 mg/dL (ref 8.4–10.5)
CO2: 30 mEq/L (ref 19–32)
CREATININE: 0.82 mg/dL (ref 0.40–1.20)
Chloride: 104 mEq/L (ref 96–112)
GFR: 73.43 mL/min (ref >60.00)
Glucose, Bld: 96 mg/dL (ref 70–99)
Potassium: 3.9 mEq/L (ref 3.5–5.1)
Sodium: 140 mEq/L (ref 135–145)
TOTAL PROTEIN: 7.1 g/dL (ref 6.0–8.3)

## 2017-11-24 LAB — LIPID PANEL
Cholesterol: 168 mg/dL (ref 0–200)
HDL: 52.7 mg/dL (ref >39.00)
LDL Cholesterol: 101 mg/dL — ABNORMAL HIGH (ref 0–99)
NonHDL: 114.81
TRIGLYCERIDES: 71 mg/dL (ref 0.0–149.0)
Total CHOL/HDL Ratio: 3
VLDL: 14.2 mg/dL (ref 0.0–40.0)

## 2017-11-24 LAB — CBC WITH DIFFERENTIAL/PLATELET
BASOS PCT: 0.9 % (ref 0.0–3.0)
Basophils Absolute: 0.1 10*3/uL (ref 0.0–0.1)
EOS ABS: 0.2 10*3/uL (ref 0.0–0.7)
EOS PCT: 2.2 % (ref 0.0–5.0)
HEMATOCRIT: 41.8 % (ref 36.0–46.0)
Hemoglobin: 14.3 g/dL (ref 12.0–15.0)
LYMPHS PCT: 27.1 % (ref 12.0–46.0)
Lymphs Abs: 2.2 10*3/uL (ref 0.7–4.0)
MCHC: 34.2 g/dL (ref 30.0–36.0)
MCV: 89.9 fl (ref 78.0–100.0)
Monocytes Absolute: 0.5 10*3/uL (ref 0.1–1.0)
Monocytes Relative: 6.2 % (ref 3.0–12.0)
Neutro Abs: 5.3 10*3/uL (ref 1.4–7.7)
Neutrophils Relative %: 63.6 % (ref 43.0–77.0)
Platelets: 274 10*3/uL (ref 150.0–400.0)
RBC: 4.65 Mil/uL (ref 3.87–5.11)
RDW: 13.6 % (ref 11.5–15.5)
WBC: 8.3 10*3/uL (ref 4.0–10.5)

## 2017-11-24 LAB — HEMOGLOBIN A1C: Hgb A1c MFr Bld: 5.8 % (ref 4.6–6.5)

## 2017-11-24 LAB — TSH: TSH: 1.3 u[IU]/mL (ref 0.35–4.50)

## 2017-11-24 NOTE — Patient Instructions (Signed)
Brianna Espinoza , Thank you for taking time to come for your Medicare Wellness Visit. I appreciate your ongoing commitment to your health goals. Please review the following plan we discussed and let me know if I can assist you in the future.   These are the goals we discussed: Goals    . Patient Stated     Starting 11/24/2017, I will continue to take art class, do mediation, to walk twice daily and to journal in an effort to manage depression.        This is a list of the screening recommended for you and due dates:  Health Maintenance  Topic Date Due  . Flu Shot  03/03/2018  . Mammogram  06/17/2019  . Colon Cancer Screening  06/21/2021  . Tetanus Vaccine  09/20/2021  . DEXA scan (bone density measurement)  Completed  .  Hepatitis C: One time screening is recommended by Center for Disease Control  (CDC) for  adults born from [birth year] through 1967.   Completed  . Pneumonia vaccines  Completed   Preventive Care for Adults  A healthy lifestyle and preventive care can promote health and wellness. Preventive health guidelines for adults include the following key practices.  . A routine yearly physical is a good way to check with your health care provider about your health and preventive screening. It is a chance to share any concerns and updates on your health and to receive a thorough exam.  . Visit your dentist for a routine exam and preventive care every 6 months. Brush your teeth twice a day and floss once a day. Good oral hygiene prevents tooth decay and gum disease.  . The frequency of eye exams is based on your age, health, family medical history, use  of contact lenses, and other factors. Follow your health care provider's recommendations for frequency of eye exams.  . Eat a healthy diet. Foods like vegetables, fruits, whole grains, low-fat dairy products, and lean protein foods contain the nutrients you need without too many calories. Decrease your intake of foods high in solid fats, added  sugars, and salt. Eat the right amount of calories for you. Get information about a proper diet from your health care provider, if necessary.  . Regular physical exercise is one of the most important things you can do for your health. Most adults should get at least 150 minutes of moderate-intensity exercise (any activity that increases your heart rate and causes you to sweat) each week. In addition, most adults need muscle-strengthening exercises on 2 or more days a week.  Silver Sneakers may be a benefit available to you. To determine eligibility, you may visit the website: www.silversneakers.com or contact program at [phone]) [phone] Mon-Fri between 8AM-8PM.   . Maintain a healthy weight. The body mass index (BMI) is a screening tool to identify possible weight problems. It provides an estimate of body fat based on height and weight. Your health care provider can find your BMI and can help you achieve or maintain a healthy weight.   For adults 20 years and older: ? A BMI below 18.5 is considered underweight. ? A BMI of 18.5 to 24.9 is normal. ? A BMI of 25 to 29.9 is considered overweight. ? A BMI of 30 and above is considered obese.   . Maintain normal blood lipids and cholesterol levels by exercising and minimizing your intake of saturated fat. Eat a balanced diet with plenty of fruit and vegetables. Blood tests for lipids and cholesterol should begin  at age 68 and be repeated every 5 years. If your lipid or cholesterol levels are high, you are over 50, or you are at high risk for heart disease, you may need your cholesterol levels checked more frequently. Ongoing high lipid and cholesterol levels should be treated with medicines if diet and exercise are not working.  . If you smoke, find out from your health care provider how to quit. If you do not use tobacco, please do not start.  . If you choose to drink alcohol, please do not consume more than 2 drinks per day. One drink is considered to  be 12 ounces (355 mL) of beer, 5 ounces (148 mL) of wine, or 1.5 ounces (44 mL) of liquor.  . If you are 35-24 years old, ask your health care provider if you should take aspirin to prevent strokes.  . Use sunscreen. Apply sunscreen liberally and repeatedly throughout the day. You should seek shade when your shadow is shorter than you. Protect yourself by wearing long sleeves, pants, a wide-brimmed hat, and sunglasses year round, whenever you are outdoors.  . Once a month, do a whole body skin exam, using a mirror to look at the skin on your back. Tell your health care provider of new moles, moles that have irregular borders, moles that are larger than a pencil eraser, or moles that have changed in shape or color.

## 2017-11-24 NOTE — Progress Notes (Signed)
PCP notes:   Health maintenance:  No gaps identified.  Abnormal screenings:   Fall risk - hx of single fall Fall Risk  11/24/2017 11/23/2016 06/15/2016 11/19/2015 06/17/2015  Falls in the past year? Yes No No No No  Comment fell while doing gardening - - - -  Number falls in past yr: 1 - - - -  Injury with Fall? No - - - -   Depression score: 2 Depression screen [facility] 2/9 11/24/2017 11/23/2016 11/19/2015 11/13/2013  Decreased Interest 1 1 0 0  Down, Depressed, Hopeless 1 1 0 0  PHQ - 2 Score 2 2 0 0  Altered sleeping 0 1 - -  Tired, decreased energy 0 0 - -  Change in appetite 0 0 - -  Feeling bad or failure about yourself  0 0 - -  Trouble concentrating 0 0 - -  Moving slowly or fidgety/restless 0 0 - -  Suicidal thoughts 0 0 - -  PHQ-9 Score 2 3 - -  Difficult doing work/chores Somewhat difficult Not difficult at all - -   Patient concerns:   None  Nurse concerns:  None  Next PCP appt:   12/02/17 @ 0800

## 2017-11-24 NOTE — Progress Notes (Signed)
Subjective:   Brianna Espinoza is a 67 y.o. female who presents for Medicare Annual (Subsequent) preventive examination.  Review of Systems:  N/A Cardiac Risk Factors include: advanced age (>70men, >14 women);obesity (BMI >30kg/m2);dyslipidemia;hypertension     Objective:     Vitals: BP 118/82 (BP Location: Left Arm, Patient Position: Sitting, Cuff Size: Normal)   Pulse 72   Temp 98 F (36.7 C) (Oral)   Ht 5' 0.5" (1.537 m) Comment: no shoes  Wt 156 lb 8 oz (71 kg)   SpO2 97%   BMI 30.06 kg/m   Body mass index is 30.06 kg/m.  Advanced Directives 11/24/2017 11/23/2016 12/17/2014  Does Patient Have a Medical Advance Directive? Yes Yes Yes  Type of Paramedic of [place];Living will Living will;Healthcare Power of [place];Living will  Does patient want to make changes to medical advance directive? No - Patient declined - -  Copy of [place] in Chart? No - copy requested No - copy requested No - copy requested    Tobacco Social History   Tobacco Use  Smoking Status Never Smoker  Smokeless Tobacco Never Used     Counseling given: No   Clinical Intake:  Pre-visit preparation completed: Yes  Pain : No/denies pain Pain Score: 0-No pain     Nutritional Status: BMI 25 -29 Overweight Nutritional Risks: None Diabetes: No  How often do you need to have someone help you when you read instructions, pamphlets, or other written materials from your doctor or pharmacy?: 1 - Never What is the last grade level you completed in school?: 12th grade + 1 yr college  Interpreter Needed?: No  Comments: pt lives with spouse Information entered by :: LPinson, LPN  Past Medical History:  Diagnosis Date  . Allergy    allergic rhinitis  . Arthritis    OA  . Fibrocystic breast   . GERD (gastroesophageal reflux disease)   . Hyperlipidemia   . Obesity   . Osteopenia   . Stroke [facility])    cerebral infarction    . Tibia fracture 2012   playing golf    Past Surgical History:  Procedure Laterality Date  . BREAST CYST ASPIRATION  1980s   aspirated breast lump  . COLONOSCOPY  2012  . ESOPHAGOGASTRODUODENOSCOPY ENDOSCOPY  09/18/14  . INGUINAL HERNIA REPAIR Right 1956  . TUBAL LIGATION  1976   lap   Family History  Problem Relation Age of Onset  . Arthritis Mother   . Hyperlipidemia Mother   . Hypertension Mother   . COPD Mother   . Asthma Mother   . Stroke Father   . Heart disease Father   . Pancreatic cancer Maternal Grandfather   . Heart disease Maternal Grandfather   . Arthritis Maternal Grandmother   . Hypertension Maternal Grandmother   . Stroke Maternal Grandmother   . Colon cancer Neg Hx   . Esophageal cancer Neg Hx   . Rectal cancer Neg Hx   . Stomach cancer Neg Hx    Social History   Socioeconomic History  . Marital status: Married    Spouse name: Not on file  . Number of children: 1  . Years of education: 58  . Highest education level: Not on file  Occupational History  . Occupation: retired  Scientific laboratory technician  . Financial resource strain: Not on file  . Food insecurity:    Worry: Not on file    Inability: Not on file  .  Transportation needs:    Medical: Not on file    Non-medical: Not on file  Tobacco Use  . Smoking status: Never Smoker  . Smokeless tobacco: Never Used  Substance and Sexual Activity  . Alcohol use: Yes    Alcohol/week: 1.8 oz    Types: 3 Glasses of wine per week    Comment: occ-wine  . Drug use: No  . Sexual activity: Not on file  Lifestyle  . Physical activity:    Days per week: Not on file    Minutes per session: Not on file  . Stress: Not on file  Relationships  . Social connections:    Talks on phone: Not on file    Gets together: Not on file    Attends religious service: Not on file    Active member of club or organization: Not on file    Attends meetings of clubs or organizations: Not on file    Relationship status: Not on file   Other Topics Concern  . Not on file  Social History Narrative   Patient is married with one child.   Patient is left handed.   Patient has 14 yrs of education.   Patient drinks 1 and 1/2 cup daily.    Outpatient Encounter Medications as of 11/24/2017  Medication Sig  . aspirin EC 325 MG tablet Take 1 tablet (325 mg total) by mouth daily.  . Calcium Carbonate-Vit D-Min 600-400 MG-UNIT TABS Take 2 tablets by mouth daily.    . fexofenadine (ALLEGRA) 180 MG tablet Take 180 mg by mouth daily as needed (during allergy season).   . fluticasone (FLONASE) 50 MCG/ACT nasal spray Place 2 sprays into both nostrils daily as needed for allergies or rhinitis.  . hydrochlorothiazide (HYDRODIURIL) 25 MG tablet Take 1 tablet (25 mg total) by mouth daily.  Marland Kitchen lovastatin (MEVACOR) 20 MG tablet Take 1 tablet (20 mg total) by mouth at bedtime. (Patient taking differently: Take 20 mg by mouth every other day. )  . Multiple Vitamin (MULTIVITAMIN) tablet Take 1 tablet by mouth daily.    . Omega-3 Fatty Acids (FISH OIL PO) Take 1 capsule by mouth daily.  Marland Kitchen omeprazole (PRILOSEC) 20 MG capsule Take 1 capsule (20 mg total) by mouth every other day.  . [DISCONTINUED] cephALEXin (KEFLEX) 250 MG capsule Take 1 capsule (250 mg total) by mouth 2 (two) times daily.   No facility-administered encounter medications on file as of 11/24/2017.     Activities of Daily Living In your present state of health, do you have any difficulty performing the following activities: 11/24/2017  Hearing? Y  Vision? N  Difficulty concentrating or making decisions? N  Walking or climbing stairs? N  Dressing or bathing? N  Doing errands, shopping? N  Preparing Food and eating ? N  Using the Toilet? N  In the past six months, have you accidently leaked urine? Y  Do you have problems with loss of bowel control? N  Managing your Medications? N  Managing your Finances? N  Housekeeping or managing your Housekeeping? N  Some recent data  might be hidden    Patient Care Team: Tower, Wynelle Fanny, MD as PCP - General Edison Pace, Josie Saunders, MD as Consulting Physician (Ophthalmology)    Assessment:   This is a routine wellness examination for [place].\  Hearing Screening Comments: Bilateral hearing aids Vision Screening Comments: Feb 2019 with Dr. Benay Pillow   Exercise Activities and Dietary recommendations Current Exercise Habits: Home exercise routine, Type of exercise:  walking;Other - see comments(golf), Exercise limited by: None identified  Goals    . Patient Stated     Starting 11/24/2017, I will continue to take art class, do mediation, to walk twice daily and to journal in an effort to manage depression.        Fall Risk Fall Risk  11/24/2017 11/23/2016 06/15/2016 11/19/2015 06/17/2015  Falls in the past year? Yes No No No No  Comment fell while doing gardening - - - -  Number falls in past yr: 1 - - - -  Injury with Fall? No - - - -   Depression Screen PHQ 2/9 Scores 11/24/2017 11/23/2016 11/19/2015 11/13/2013  PHQ - 2 Score 2 2 0 0  PHQ- 9 Score 2 3 - -     Cognitive Function MMSE - Mini Mental State Exam 11/24/2017 11/23/2016  Orientation to time 5 5  Orientation to Place 5 5  Registration 3 3  Attention/ Calculation 0 0  Recall 3 3  Language- name 2 objects 0 0  Language- repeat 1 1  Language- follow 3 step command 3 3  Language- read & follow direction 0 0  Write a sentence 0 0  Copy design 0 0  Total score 20 20     PLEASE NOTE: A Mini-Cog screen was completed. Maximum score is 20. A value of 0 denotes this part of [place] MMSE was not completed or the patient failed this part of the Mini-Cog screening.   Mini-Cog Screening Orientation to Time - Max 5 pts Orientation to Place - Max 5 pts Registration - Max 3 pts Recall - Max 3 pts Language Repeat - Max 1 pts Language Follow 3 Step Command - Max 3 pts     Immunization History  Administered Date(s) Administered  . Influenza Split 04/29/2011,  07/20/2012  . Influenza,inj,Quad PF,6+ Mos 05/30/2014, 05/24/2015, 06/02/2016, 06/08/2017  . Influenza-Unspecified 05/25/2013  . Pneumococcal Conjugate-13 11/16/2014  . Pneumococcal Polysaccharide-23 11/19/2015  . Zoster 02/21/2014    Screening Tests Health Maintenance  Topic Date Due  . INFLUENZA VACCINE  03/03/2018  . MAMMOGRAM  06/17/2019  . COLONOSCOPY  06/21/2021  . TETANUS/TDAP  09/20/2021  . DEXA SCAN  Completed  . Hepatitis C Screening  Completed  . PNA vac Low Risk Adult  Completed       Plan:     I have personally reviewed, addressed, and noted the following in the patient's chart:  A. Medical and social history B. Use of alcohol, tobacco or illicit drugs  C. Current medications and supplements D. Functional ability and status E.  Nutritional status F.  Physical activity G. Advance directives H. List of other physicians I.  Hospitalizations, surgeries, and ER visits in previous 12 months J.  [place] to include hearing, vision, cognitive, depression L. Referrals and appointments - none  In addition, I have reviewed and discussed with patient certain preventive protocols, quality metrics, and best practice recommendations. A written personalized care plan for preventive services as well as general preventive health recommendations were provided to patient.  See attached scanned questionnaire for additional information.   Signed,   Lindell Noe, MHA, BS, LPN Health Coach'

## 2017-11-26 NOTE — Progress Notes (Signed)
I reviewed health advisor's note, was available for consultation, and agree with documentation and plan.  

## 2017-12-01 ENCOUNTER — Encounter: Payer: Medicare HMO | Admitting: Family Medicine

## 2017-12-02 ENCOUNTER — Encounter: Payer: Self-pay | Admitting: Family Medicine

## 2017-12-02 ENCOUNTER — Ambulatory Visit (INDEPENDENT_AMBULATORY_CARE_PROVIDER_SITE_OTHER): Payer: Medicare HMO | Admitting: Family Medicine

## 2017-12-02 VITALS — BP 128/82 | HR 80 | Temp 98.4°F | Ht 60.5 in | Wt 157.2 lb

## 2017-12-02 DIAGNOSIS — Z6830 Body mass index (BMI) 30.0-30.9, adult: Secondary | ICD-10-CM | POA: Diagnosis not present

## 2017-12-02 DIAGNOSIS — E6609 Other obesity due to excess calories: Secondary | ICD-10-CM | POA: Diagnosis not present

## 2017-12-02 DIAGNOSIS — E66811 Obesity, class 1: Secondary | ICD-10-CM

## 2017-12-02 DIAGNOSIS — M8580 Other specified disorders of bone density and structure, unspecified site: Secondary | ICD-10-CM | POA: Diagnosis not present

## 2017-12-02 DIAGNOSIS — I10 Essential (primary) hypertension: Secondary | ICD-10-CM

## 2017-12-02 DIAGNOSIS — E785 Hyperlipidemia, unspecified: Secondary | ICD-10-CM

## 2017-12-02 DIAGNOSIS — Z8673 Personal history of transient ischemic attack (TIA), and cerebral infarction without residual deficits: Secondary | ICD-10-CM | POA: Diagnosis not present

## 2017-12-02 DIAGNOSIS — R739 Hyperglycemia, unspecified: Secondary | ICD-10-CM | POA: Diagnosis not present

## 2017-12-02 DIAGNOSIS — I63311 Cerebral infarction due to thrombosis of right middle cerebral artery: Secondary | ICD-10-CM

## 2017-12-02 DIAGNOSIS — Z0000 Encounter for general adult medical examination without abnormal findings: Secondary | ICD-10-CM | POA: Diagnosis not present

## 2017-12-02 MED ORDER — OMEPRAZOLE 20 MG PO CPDR: 20.0000 mg | DELAYED_RELEASE_CAPSULE | ORAL | 3 refills | Status: DC

## 2017-12-02 MED ORDER — LOVASTATIN 20 MG PO TABS: 20.0000 mg | ORAL_TABLET | ORAL | 3 refills | Status: DC

## 2017-12-02 MED ORDER — HYDROCHLOROTHIAZIDE 25 MG PO TABS: 25.0000 mg | ORAL_TABLET | Freq: Every day | ORAL | 3 refills | Status: DC

## 2017-12-02 NOTE — Assessment & Plan Note (Signed)
bp in fair control at this time  BP Readings from Last 1 Encounters:  12/02/17 128/82   No changes needed Disc lifstyle change with low sodium diet and exercise   Labs rev

## 2017-12-02 NOTE — Assessment & Plan Note (Signed)
Due for 2 y dexa Pt wants to put it off until next mammogram  Will call for ref in the fall No fx On ca and D  Disc need for calcium/ vitamin D/ wt bearing exercise and bone density test every 2 y to monitor Disc safety/ fracture risk in detail

## 2017-12-02 NOTE — Assessment & Plan Note (Signed)
No further events  Good bp control  Cholesterol improved on most statin tolerable

## 2017-12-02 NOTE — Assessment & Plan Note (Signed)
Discussed how this problem influences overall health and the risks it imposes  Reviewed plan for weight loss with lower calorie diet (via better food choices and also portion control or program like weight watchers) and exercise building up to or more than 30 minutes 5 days per week including some aerobic activity    

## 2017-12-02 NOTE — Assessment & Plan Note (Signed)
Reviewed health habits including diet and exercise and skin cancer prevention Reviewed appropriate screening tests for age  Also reviewed health mt list, fam hx and immunization status , as well as social and family history   amw reviewed  Labs rev  See HPI Doing well  Disc shingrix  Will plan on dexa in the fall with her mammogram  Enc to keep exercising Doing better with grief-enc journal and meditation

## 2017-12-02 NOTE — Patient Instructions (Addendum)
Call us a month before you get your mammogram so we can do the referral for bone density tests   If you are interested in the new shingles vaccine (Shingrix) - call your local pharmacy to check on coverage and availability  Call your pharmacy to get on a wait list for it if it is affordable to you   Take care of yourself  Stay active

## 2017-12-02 NOTE — Progress Notes (Signed)
Subjective:    Patient ID: Brianna Espinoza, female    DOB: [birth year], 67 y.o.   MRN: [id number]  HPI Here for health maintenance exam and to review chronic medical problems  Working in the yard - bad for allergies Has gone to the yard   Trying to take care of herself   Wt Readings from Last 3 Encounters:  12/02/17 157 lb 4 oz (71.3 kg)  11/24/17 156 lb 8 oz (71 kg)  09/30/17 158 lb 12 oz (72 kg)  stable weight  30.21 kg/m   amw was on 4/24 One fall while gardening (pulling on a tree limb and gave way)   Mammogram 11/18 nl  Self breast exam -no lumps   Colonoscopy 11/12 with 10 y recall   dexa 11/16 Stable osteopenia  She wants to get it with her mammogram  No fractures  She takes her ca and D   zostavax 7/15 Interested in shingrix   Declines tetanus vaccine   bp is stable today  No cp or palpitations or headaches or edema  No side effects to medicines  BP Readings from Last 3 Encounters:  12/02/17 128/82  11/24/17 118/82  09/30/17 122/68      Hyperglycemia Lab Results  Component Value Date   HGBA1C 5.8 11/24/2017    Hyperlipidemia  Lab Results  Component Value Date   CHOL 168 11/24/2017   CHOL 192 11/23/2016   CHOL 196 11/14/2015   Lab Results  Component Value Date   HDL 52.70 11/24/2017   HDL 53.80 11/23/2016   HDL 47.10 11/14/2015   Lab Results  Component Value Date   LDLCALC 101 (H) 11/24/2017   LDLCALC 124 (H) 11/23/2016   LDLCALC 133 (H) 11/14/2015   Lab Results  Component Value Date   TRIG 71.0 11/24/2017   TRIG 69.0 11/23/2016   TRIG 79.0 11/14/2015   Lab Results  Component Value Date   CHOLHDL 3 11/24/2017   CHOLHDL 4 11/23/2016   CHOLHDL 4 11/14/2015   No results found for: LDLDIRECT Intolerant to statins in the past  Now on lovastatin 20 mg every other day Also watches her diet- so far so good   Lab Results  Component Value Date   CREATININE 0.82 11/24/2017   BUN 20 11/24/2017   NA 140 11/24/2017   K 3.9  11/24/2017   CL 104 11/24/2017   CO2 30 11/24/2017   Lab Results  Component Value Date   ALT 12 11/24/2017   AST 17 11/24/2017   ALKPHOS 68 11/24/2017   BILITOT 0.6 11/24/2017    Lab Results  Component Value Date   WBC 8.3 11/24/2017   HGB 14.3 11/24/2017   HCT 41.8 11/24/2017   MCV 89.9 11/24/2017   PLT 274.0 11/24/2017   Lab Results  Component Value Date   TSH 1.30 11/24/2017    Patient Active Problem List   Diagnosis Date Noted  . Grief reaction 12/13/2015  . Routine general medical examination at a health care facility 11/19/2015  . Estrogen deficiency 04/01/2015  . Cerebral infarction due to thrombosis of right middle cerebral artery ([place]) 12/17/2014  . Encounter for Medicare annual wellness exam 11/16/2014  . Hyperglycemia 11/16/2014  . Cerebral infarction ([place]) 10/03/2014  . Essential hypertension 10/03/2014  . Obesity 09/21/2011  . Osteopenia 03/12/2009  . Hyperlipidemia 12/13/2006  . ALLERGIC RHINITIS 12/13/2006  . GERD 12/13/2006  . OSTEOARTHRITIS 12/13/2006   Past Medical History:  Diagnosis Date  . Allergy    allergic  rhinitis  . Arthritis    OA  . Fibrocystic breast   . GERD (gastroesophageal reflux disease)   . Hyperlipidemia   . Obesity   . Osteopenia   . Stroke [facility])    cerebral infarction  . Tibia fracture 2012   playing golf    Past Surgical History:  Procedure Laterality Date  . BREAST CYST ASPIRATION  1980s   aspirated breast lump  . COLONOSCOPY  2012  . ESOPHAGOGASTRODUODENOSCOPY ENDOSCOPY  09/18/14  . INGUINAL HERNIA REPAIR Right 1956  . TUBAL LIGATION  1976   lap   Social History   Tobacco Use  . Smoking status: Never Smoker  . Smokeless tobacco: Never Used  Substance Use Topics  . Alcohol use: Yes    Alcohol/week: 1.8 oz    Types: 3 Glasses of wine per week    Comment: occ-wine  . Drug use: No   Family History  Problem Relation Age of Onset  . Arthritis Mother   . Hyperlipidemia Mother   . Hypertension Mother     . COPD Mother   . Asthma Mother   . Stroke Father   . Heart disease Father   . Pancreatic cancer Maternal Grandfather   . Heart disease Maternal Grandfather   . Arthritis Maternal Grandmother   . Hypertension Maternal Grandmother   . Stroke Maternal Grandmother   . Colon cancer Neg Hx   . Esophageal cancer Neg Hx   . Rectal cancer Neg Hx   . Stomach cancer Neg Hx    Allergies  Allergen Reactions  . Buspar [Buspirone]     nausea  . Lipitor [Atorvastatin] Other (See Comments)    Muscle pain   . Tetanus Toxoid Hives  . Wellbutrin [Bupropion] Hives  . Acetaminophen Rash  . Septra [Sulfamethoxazole-Trimethoprim] Rash    Body rash   Current Outpatient Medications on File Prior to Visit  Medication Sig Dispense Refill  . aspirin EC 325 MG tablet Take 1 tablet (325 mg total) by mouth daily. 90 tablet 3  . Calcium Carbonate-Vit D-Min 600-400 MG-UNIT TABS Take 2 tablets by mouth daily.      . fexofenadine (ALLEGRA) 180 MG tablet Take 180 mg by mouth daily as needed (during allergy season).     . fluticasone (FLONASE) 50 MCG/ACT nasal spray Place 2 sprays into both nostrils daily as needed for allergies or rhinitis. 16 g 1  . Multiple Vitamin (MULTIVITAMIN) tablet Take 1 tablet by mouth daily.      . Omega-3 Fatty Acids (FISH OIL PO) Take 1 capsule by mouth daily.     No current facility-administered medications on file prior to visit.     Review of Systems  Constitutional: Negative for activity change, appetite change, fatigue, fever and unexpected weight change.  HENT: Negative for congestion, ear pain, rhinorrhea, sinus pressure and sore throat.   Eyes: Negative for pain, redness and visual disturbance.  Respiratory: Negative for cough, shortness of breath and wheezing.   Cardiovascular: Negative for chest pain and palpitations.  Gastrointestinal: Negative for abdominal pain, blood in stool, constipation and diarrhea.  Endocrine: Negative for polydipsia and polyuria.   Genitourinary: Negative for dysuria, frequency and urgency.  Musculoskeletal: Negative for arthralgias, back pain and myalgias.  Skin: Negative for pallor and rash.       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  Neurological: Negative for dizziness, syncope and headaches.  Hematological: Negative for adenopathy. Does not bruise/bleed  easily.  Psychiatric/Behavioral: Negative for decreased concentration and dysphoric mood. The patient is not nervous/anxious.        Grief Doing better with meditation/journaling and painting        Objective:   Physical Exam  Constitutional: She appears well-developed and well-nourished. No distress.  obese and well appearing   HENT:  Head: Normocephalic and atraumatic.  Right Ear: External ear normal.  Left Ear: External ear normal.  Mouth/Throat: Oropharynx is clear and moist.  Eyes: Pupils are equal, round, and reactive to light. Conjunctivae and EOM are normal. No scleral icterus.  Neck: Normal range of motion. Neck supple. No JVD present. Carotid bruit is not present. No thyromegaly present.  Cardiovascular: Normal rate, regular rhythm, normal heart sounds and intact distal pulses. Exam reveals no gallop.  Pulmonary/Chest: Effort normal and breath sounds normal. No respiratory distress. She has no wheezes. She exhibits no tenderness. No breast tenderness, discharge or bleeding.  Abdominal: Soft. Bowel sounds are normal. She exhibits no distension, no abdominal bruit and no mass. There is no tenderness.  Genitourinary: No breast tenderness, discharge or bleeding.  Genitourinary Comments: Breast exam: No mass, nodules, thickening, tenderness, bulging, retraction, inflamation, nipple discharge or skin changes noted.  No axillary or clavicular LA.      Musculoskeletal: Normal range of motion. She exhibits no edema or tenderness.  Lymphadenopathy:    She has no cervical  adenopathy.  Neurological: She is alert. She has normal reflexes. No cranial nerve deficit. She exhibits normal muscle tone. Coordination normal.  Skin: Skin is warm and dry. No rash noted. No erythema. No pallor.  Solar lentigines diffusely   Healing scar on L face from squamous cell removal  Psychiatric: She has a normal mood and affect.  Mood is very good today          Assessment & Plan:   Problem List Items Addressed This Visit      Cardiovascular and Mediastinum   Essential hypertension    bp in fair control at this time  BP Readings from Last 1 Encounters:  12/02/17 128/82   No changes needed Disc lifstyle change with low sodium diet and exercise   Labs rev       Relevant Medications   hydrochlorothiazide (HYDRODIURIL) 25 MG tablet   lovastatin (MEVACOR) 20 MG tablet     Nervous and Auditory   Cerebral infarction (HCC)    No further events  Good bp control  Cholesterol improved on most statin tolerable        Musculoskeletal and Integument   Osteopenia    Due for 2 y dexa Pt wants to put it off until next mammogram  Will call for ref in the fall No fx On ca and D  Disc need for calcium/ vitamin D/ wt bearing exercise and bone density test every 2 y to monitor Disc safety/ fracture risk in detail          Other   Hyperglycemia    Lab Results  Component Value Date   HGBA1C 5.8 11/24/2017   This is stable disc imp of low glycemic diet and wt loss to prevent DM2       Hyperlipidemia    Disc goals for lipids and reasons to control them Rev labs with pt Rev low sat fat diet in detail Improved with lovastatin 20 mg QOD Most she can tolerate  LDL 101- goal is below 70 for hx of CVA      Relevant Medications  hydrochlorothiazide (HYDRODIURIL) 25 MG tablet   lovastatin (MEVACOR) 20 MG tablet   Obesity    Discussed how this problem influences overall health and the risks it imposes  Reviewed plan for weight loss with lower calorie diet (via  better food choices and also portion control or program like weight watchers) and exercise building up to or more than 30 minutes 5 days per week including some aerobic activity         Routine general medical examination at a health care facility - Primary    Reviewed health habits including diet and exercise and skin cancer prevention Reviewed appropriate screening tests for age  Also reviewed health mt list, fam hx and immunization status , as well as social and family history   amw reviewed  Labs rev  See HPI Doing well  Disc shingrix  Will plan on dexa in the fall with her mammogram  Enc to keep exercising Doing better with grief-enc journal and meditation

## 2017-12-02 NOTE — Assessment & Plan Note (Signed)
Disc goals for lipids and reasons to control them Rev labs with pt Rev low sat fat diet in detail Improved with lovastatin 20 mg QOD Most she can tolerate  LDL 101- goal is below 70 for hx of CVA

## 2017-12-02 NOTE — Assessment & Plan Note (Signed)
Lab Results  Component Value Date   HGBA1C 5.8 11/24/2017   This is stable disc imp of low glycemic diet and wt loss to prevent DM2

## 2017-12-14 DIAGNOSIS — M189 Osteoarthritis of first carpometacarpal joint, unspecified: Secondary | ICD-10-CM | POA: Diagnosis not present

## 2018-05-18 ENCOUNTER — Telehealth: Payer: Self-pay | Admitting: *Deleted

## 2018-05-18 ENCOUNTER — Other Ambulatory Visit: Payer: Self-pay | Admitting: Family Medicine

## 2018-05-18 DIAGNOSIS — Z1231 Encounter for screening mammogram for malignant neoplasm of breast: Secondary | ICD-10-CM

## 2018-05-18 DIAGNOSIS — E2839 Other primary ovarian failure: Secondary | ICD-10-CM

## 2018-05-18 NOTE — Telephone Encounter (Signed)
Bone Density Appt made for 07/21/18 at 10:15 am will call patient with her Appt info.

## 2018-05-18 NOTE — Telephone Encounter (Signed)
Ref done Will send to [facility] 

## 2018-05-18 NOTE — Telephone Encounter (Signed)
Copied from [place] [id number]. Topic: Referral - Request for Referral >> May 18, 2018  8:45 AM Scherrie Gerlach wrote: Pt needs an order for her bone density sent to [facility] in [place]. Pt is due now.

## 2018-05-26 ENCOUNTER — Ambulatory Visit: Payer: Medicare HMO

## 2018-05-27 ENCOUNTER — Encounter: Payer: Self-pay | Admitting: Family Medicine

## 2018-06-09 ENCOUNTER — Ambulatory Visit (INDEPENDENT_AMBULATORY_CARE_PROVIDER_SITE_OTHER): Payer: Medicare HMO

## 2018-06-09 DIAGNOSIS — Z23 Encounter for immunization: Secondary | ICD-10-CM | POA: Diagnosis not present

## 2018-06-21 ENCOUNTER — Ambulatory Visit
Admission: RE | Admit: 2018-06-21 | Discharge: 2018-06-21 | Disposition: A | Discharge status: Home / Self Care | Payer: Medicare HMO | Source: Ambulatory Visit | Attending: Family Medicine | Admitting: Family Medicine

## 2018-06-21 DIAGNOSIS — Z1231 Encounter for screening mammogram for malignant neoplasm of breast: Secondary | ICD-10-CM | POA: Diagnosis not present

## 2018-07-21 ENCOUNTER — Ambulatory Visit
Admission: RE | Admit: 2018-07-21 | Discharge: 2018-07-21 | Disposition: A | Discharge status: Home / Self Care | Payer: Medicare HMO | Source: Ambulatory Visit | Attending: Family Medicine | Admitting: Family Medicine

## 2018-07-21 DIAGNOSIS — Z780 Asymptomatic menopausal state: Secondary | ICD-10-CM | POA: Diagnosis not present

## 2018-07-21 DIAGNOSIS — E2839 Other primary ovarian failure: Secondary | ICD-10-CM

## 2018-07-21 DIAGNOSIS — M85851 Other specified disorders of bone density and structure, right thigh: Secondary | ICD-10-CM | POA: Diagnosis not present

## 2018-10-14 ENCOUNTER — Other Ambulatory Visit: Payer: Self-pay | Admitting: Family Medicine

## 2018-12-01 ENCOUNTER — Ambulatory Visit: Payer: Medicare HMO

## 2018-12-02 ENCOUNTER — Ambulatory Visit (INDEPENDENT_AMBULATORY_CARE_PROVIDER_SITE_OTHER): Payer: Medicare HMO

## 2018-12-02 DIAGNOSIS — Z0000 Encounter for general adult medical examination without abnormal findings: Secondary | ICD-10-CM | POA: Diagnosis not present

## 2018-12-02 NOTE — Progress Notes (Signed)
Subjective:   Brianna Espinoza is a 68 y.o. female who presents for Medicare Annual (Subsequent) preventive examination.  Review of Systems:  N/A Cardiac Risk Factors include: advanced age (>35men, >16 women);dyslipidemia;hypertension     Objective:     Vitals: There were no vitals taken for this visit.  There is no height or weight on file to calculate BMI.  Advanced Directives 12/02/2018 11/24/2017 11/23/2016 12/17/2014  Does Patient Have a Medical Advance Directive? Yes Yes Yes Yes  Type of Paramedic of [place];Living will [place];Living will Living will;Healthcare Power of [place];Living will  Does patient want to make changes to medical advance directive? - No - Patient declined - -  Copy of [place] in Chart? No - copy requested No - copy requested No - copy requested No - copy requested    Tobacco Social History   Tobacco Use  Smoking Status Never Smoker  Smokeless Tobacco Never Used     Counseling given: No   Clinical Intake:  Pre-visit preparation completed: Yes  Pain : No/denies pain Pain Score: 0-No pain     Nutritional Status: BMI 25 -29 Overweight Nutritional Risks: None Diabetes: No  How often do you need to have someone help you when you read instructions, pamphlets, or other written materials from your doctor or pharmacy?: 1 - Never What is the last grade level you completed in school?: 12th grade + 1 yr college  Interpreter Needed?: No  Comments: pt lives with spouse Information entered by :: LPinson, LPN  Past Medical History:  Diagnosis Date  . Allergy    allergic rhinitis  . Arthritis    OA  . Fibrocystic breast   . GERD (gastroesophageal reflux disease)   . Hyperlipidemia   . Obesity   . Osteopenia   . Stroke [facility])    cerebral infarction  . Tibia fracture 2012   playing golf    Past Surgical History:  Procedure Laterality Date  .  BREAST CYST ASPIRATION  1980s   aspirated breast lump  . COLONOSCOPY  2012  . ESOPHAGOGASTRODUODENOSCOPY ENDOSCOPY  09/18/14  . INGUINAL HERNIA REPAIR Right 1956  . TUBAL LIGATION  1976   lap   Family History  Problem Relation Age of Onset  . Arthritis Mother   . Hyperlipidemia Mother   . Hypertension Mother   . COPD Mother   . Asthma Mother   . Stroke Father   . Heart disease Father   . Pancreatic cancer Maternal Grandfather   . Heart disease Maternal Grandfather   . Arthritis Maternal Grandmother   . Hypertension Maternal Grandmother   . Stroke Maternal Grandmother   . Colon cancer Neg Hx   . Esophageal cancer Neg Hx   . Rectal cancer Neg Hx   . Stomach cancer Neg Hx   . Breast cancer Neg Hx    Social History   Socioeconomic History  . Marital status: Married    Spouse name: Not on file  . Number of children: 1  . Years of education: 13  . Highest education level: Not on file  Occupational History  . Occupation: retired  Scientific laboratory technician  . Financial resource strain: Not on file  . Food insecurity:    Worry: Not on file    Inability: Not on file  . Transportation needs:    Medical: Not on file    Non-medical: Not on file  Tobacco Use  . Smoking  status: Never Smoker  . Smokeless tobacco: Never Used  Substance and Sexual Activity  . Alcohol use: Yes    Alcohol/week: 3.0 standard drinks    Types: 3 Glasses of wine per week    Comment: occ-wine  . Drug use: No  . Sexual activity: Not on file  Lifestyle  . Physical activity:    Days per week: Not on file    Minutes per session: Not on file  . Stress: Not on file  Relationships  . Social connections:    Talks on phone: Not on file    Gets together: Not on file    Attends religious service: Not on file    Active member of club or organization: Not on file    Attends meetings of clubs or organizations: Not on file    Relationship status: Not on file  Other Topics Concern  . Not on file  Social History  Narrative   Patient is married with one child.   Patient is left handed.   Patient has 14 yrs of education.   Patient drinks 1 and 1/2 cup daily.    Outpatient Encounter Medications as of 12/02/2018  Medication Sig  . aspirin EC 325 MG tablet Take 1 tablet (325 mg total) by mouth daily.  . Calcium Carbonate-Vit D-Min 600-400 MG-UNIT TABS Take 2 tablets by mouth daily.    . fexofenadine (ALLEGRA) 180 MG tablet Take 180 mg by mouth daily as needed (during allergy season).   . fluticasone (FLONASE) 50 MCG/ACT nasal spray Place 2 sprays into both nostrils daily as needed for allergies or rhinitis.  . hydrochlorothiazide (HYDRODIURIL) 25 MG tablet Take 1 tablet (25 mg total) by mouth daily.  Marland Kitchen lovastatin (MEVACOR) 20 MG tablet Take 1 tablet (20 mg total) by mouth every other day.  . Multiple Vitamin (MULTIVITAMIN) tablet Take 1 tablet by mouth daily.    Marland Kitchen omeprazole (PRILOSEC) 20 MG capsule Take 1 capsule (20 mg total) by mouth every other day.  . [DISCONTINUED] Omega-3 Fatty Acids (FISH OIL PO) Take 1 capsule by mouth daily.   No facility-administered encounter medications on file as of 12/02/2018.     Activities of Daily Living In your present state of health, do you have any difficulty performing the following activities: 12/02/2018  Hearing? Y  Comment hearing aids  Vision? N  Difficulty concentrating or making decisions? N  Walking or climbing stairs? N  Dressing or bathing? N  Doing errands, shopping? N  Preparing Food and eating ? N  Using the Toilet? N  In the past six months, have you accidently leaked urine? N  Do you have problems with loss of bowel control? N  Managing your Medications? N  Managing your Finances? N  Housekeeping or managing your Housekeeping? N  Some recent data might be hidden    Patient Care Team: [place], Wynelle Fanny, MD as PCP - General Edison Pace, Josie Saunders, MD as Consulting Physician (Ophthalmology)    Assessment:   This is a routine wellness examination  for [place].  Hearing Screening Comments: Bilateral hearing aids Vision Screening Comments: Vision exam in 2019/Dr. KIng  Exercise Activities and Dietary recommendations Current Exercise Habits: Home exercise routine, Type of exercise: walking;Other - see comments(golf, gardening), Time (Minutes): 45, Frequency (Times/Week): 7, Weekly Exercise (Minutes/Week): 315, Intensity: Mild, Exercise limited by: None identified  Goals    . Increase physical activity     Starting 12/02/2018, I will continue to exercise for 45 minutes daily.  Fall Risk Fall Risk  12/02/2018 11/24/2017 11/23/2016 06/15/2016 11/19/2015  Falls in the past year? 0 Yes No No No  Comment - fell while doing gardening - - -  Number falls in past yr: - 1 - - -  Injury with Fall? - No - - -   Depression Screen PHQ 2/9 Scores 12/02/2018 11/24/2017 11/23/2016 11/19/2015  PHQ - 2 Score 0 2 2 0  PHQ- 9 Score 0 2 3 -     Cognitive Function MMSE - Mini Mental State Exam 12/02/2018 11/24/2017 11/23/2016  Orientation to time 5 5 5   Orientation to Place 5 5 5   Registration 3 3 3   Attention/ Calculation 0 0 0  Recall 3 3 3   Language- name 2 objects 0 0 0  Language- repeat 1 1 1   Language- follow 3 step command 0 3 3  Language- read & follow direction 0 0 0  Write a sentence 0 0 0  Copy design 0 0 0  Total score 17 20 20      PLEASE NOTE: A Mini-Cog screen was completed. Maximum score is 17. A value of 0 denotes this part of [place] MMSE was not completed or the patient failed this part of the Mini-Cog screening.   Mini-Cog Screening Orientation to Time - Max 5 pts Orientation to Place - Max 5 pts Registration - Max 3 pts Recall - Max 3 pts Language Repeat - Max 1 pts      Immunization History  Administered Date(s) Administered  . Influenza Split 04/29/2011, 07/20/2012  . Influenza,inj,Quad PF,6+ Mos 05/30/2014, 05/24/2015, 06/02/2016, 06/08/2017, 06/09/2018  . Influenza-Unspecified 05/25/2013  . Pneumococcal  Conjugate-13 11/16/2014  . Pneumococcal Polysaccharide-23 11/19/2015  . Zoster 02/21/2014    Screening Tests Health Maintenance  Topic Date Due  . INFLUENZA VACCINE  03/04/2019  . MAMMOGRAM  06/21/2020  . COLONOSCOPY  06/21/2021  . TETANUS/TDAP  09/20/2021  . DEXA SCAN  Completed  . Hepatitis C Screening  Completed  . PNA vac Low Risk Adult  Completed      Plan:     I have personally reviewed, addressed, and noted the following in the patient's chart:  A. Medical and social history B. Use of alcohol, tobacco or illicit drugs  C. Current medications and supplements D. Functional ability and status E.  Nutritional status F.  Physical activity G. Advance directives H. List of other physicians I.  Hospitalizations, surgeries, and ER visits in previous 12 months J.  Vitals (unless it is a telemedicine encounter) K. Screenings to include cognitive, depression, hearing, vision (NOTE: hearing and vision screenings not completed in telemedicine encounter) L. Referrals and appointments   In addition, I have reviewed and discussed with patient certain preventive protocols, quality metrics, and best practice recommendations. A written personalized care plan for preventive services and recommendations were provided to patient.  With patient's permission, we connected on 12/02/18 at 11:30 AM EDT by a video enabled telemedicine application. Two patient identifiers were used to ensure the encounter occurred with the correct person.    Patient was in home and writer was in office.   Signed,   Lindell Noe, MHA, BS, LPN Health Coach

## 2018-12-02 NOTE — Patient Instructions (Signed)
Brianna Espinoza , Thank you for taking time to come for your Medicare Wellness Visit. I appreciate your ongoing commitment to your health goals. Please review the following plan we discussed and let me know if I can assist you in the future.   These are the goals we discussed: Goals    . Increase physical activity     Starting 12/02/2018, I will continue to exercise for 45 minutes daily.        This is a list of the screening recommended for you and due dates:  Health Maintenance  Topic Date Due  . Flu Shot  03/04/2019  . Mammogram  06/21/2020  . Colon Cancer Screening  06/21/2021  . Tetanus Vaccine  09/20/2021  . DEXA scan (bone density measurement)  Completed  .  Hepatitis C: One time screening is recommended by Center for Disease Control  (CDC) for  adults born from [birth year] through 1967.   Completed  . Pneumonia vaccines  Completed   Preventive Care for Adults  A healthy lifestyle and preventive care can promote health and wellness. Preventive health guidelines for adults include the following key practices.  . A routine yearly physical is a good way to check with your health care provider about your health and preventive screening. It is a chance to share any concerns and updates on your health and to receive a thorough exam.  . Visit your dentist for a routine exam and preventive care every 6 months. Brush your teeth twice a day and floss once a day. Good oral hygiene prevents tooth decay and gum disease.  . The frequency of eye exams is based on your age, health, family medical history, use  of contact lenses, and other factors. Follow your health care provider's recommendations for frequency of eye exams.  . Eat a healthy diet. Foods like vegetables, fruits, whole grains, low-fat dairy products, and lean protein foods contain the nutrients you need without too many calories. Decrease your intake of foods high in solid fats, added sugars, and salt. Eat the right amount of calories for  you. Get information about a proper diet from your health care provider, if necessary.  . Regular physical exercise is one of the most important things you can do for your health. Most adults should get at least 150 minutes of moderate-intensity exercise (any activity that increases your heart rate and causes you to sweat) each week. In addition, most adults need muscle-strengthening exercises on 2 or more days a week.  Silver Sneakers may be a benefit available to you. To determine eligibility, you may visit the website: www.silversneakers.com or contact program at 416 329 8321 Mon-Fri between 8AM-8PM.   . Maintain a healthy weight. The body mass index (BMI) is a screening tool to identify possible weight problems. It provides an estimate of body fat based on height and weight. Your health care provider can find your BMI and can help you achieve or maintain a healthy weight.   For adults 20 years and older: ? A BMI below 18.5 is considered underweight. ? A BMI of 18.5 to 24.9 is normal. ? A BMI of 25 to 29.9 is considered overweight. ? A BMI of 30 and above is considered obese.   . Maintain normal blood lipids and cholesterol levels by exercising and minimizing your intake of saturated fat. Eat a balanced diet with plenty of fruit and vegetables. Blood tests for lipids and cholesterol should begin at age 56 and be repeated every 5 years. If your lipid  or cholesterol levels are high, you are over 50, or you are at high risk for heart disease, you may need your cholesterol levels checked more frequently. Ongoing high lipid and cholesterol levels should be treated with medicines if diet and exercise are not working.  . If you smoke, find out from your health care provider how to quit. If you do not use tobacco, please do not start.  . If you choose to drink alcohol, please do not consume more than 2 drinks per day. One drink is considered to be 12 ounces (355 mL) of beer, 5 ounces (148 mL) of  wine, or 1.5 ounces (44 mL) of liquor.  . If you are 30-27 years old, ask your health care provider if you should take aspirin to prevent strokes.  . Use sunscreen. Apply sunscreen liberally and repeatedly throughout the day. You should seek shade when your shadow is shorter than you. Protect yourself by wearing long sleeves, pants, a wide-brimmed hat, and sunglasses year round, whenever you are outdoors.  . Once a month, do a whole body skin exam, using a mirror to look at the skin on your back. Tell your health care provider of new moles, moles that have irregular borders, moles that are larger than a pencil eraser, or moles that have changed in shape or color.

## 2018-12-02 NOTE — Progress Notes (Signed)
PCP notes:   Health maintenance:  No gaps identified.   Abnormal screenings:   None  Patient concerns:   None  Nurse concerns:  None  Next PCP appt:   12/07/18 @ 0800  I reviewed health advisor's note, was available for consultation, and agree with documentation and plan. Loura Pardon MD

## 2018-12-05 ENCOUNTER — Other Ambulatory Visit (INDEPENDENT_AMBULATORY_CARE_PROVIDER_SITE_OTHER): Payer: Medicare HMO

## 2018-12-05 ENCOUNTER — Telehealth: Payer: Self-pay | Admitting: Family Medicine

## 2018-12-05 ENCOUNTER — Encounter: Payer: Medicare HMO | Admitting: Family Medicine

## 2018-12-05 ENCOUNTER — Other Ambulatory Visit: Payer: Self-pay

## 2018-12-05 DIAGNOSIS — E7800 Pure hypercholesterolemia, unspecified: Secondary | ICD-10-CM

## 2018-12-05 DIAGNOSIS — I10 Essential (primary) hypertension: Secondary | ICD-10-CM

## 2018-12-05 DIAGNOSIS — R7303 Prediabetes: Secondary | ICD-10-CM

## 2018-12-05 LAB — COMPREHENSIVE METABOLIC PANEL
ALT: 14 U/L (ref 0–35)
AST: 18 U/L (ref 0–37)
Albumin: 4 g/dL (ref 3.5–5.2)
Alkaline Phosphatase: 71 U/L (ref 39–117)
BUN: 22 mg/dL (ref 6–23)
CO2: 30 mEq/L (ref 19–32)
Calcium: 8.9 mg/dL (ref 8.4–10.5)
Chloride: 101 mEq/L (ref 96–112)
Creatinine, Ser: 0.83 mg/dL (ref 0.40–1.20)
GFR: 67.92 mL/min (ref >60.00)
Glucose, Bld: 89 mg/dL (ref 70–99)
Potassium: 3.7 mEq/L (ref 3.5–5.1)
Sodium: 140 mEq/L (ref 135–145)
Total Bilirubin: 0.6 mg/dL (ref 0.2–1.2)
Total Protein: 6.4 g/dL (ref 6.0–8.3)

## 2018-12-05 LAB — CBC WITH DIFFERENTIAL/PLATELET
Basophils Absolute: 0.1 10*3/uL (ref 0.0–0.1)
Basophils Relative: 1 % (ref 0.0–3.0)
Eosinophils Absolute: 0.2 10*3/uL (ref 0.0–0.7)
Eosinophils Relative: 2.4 % (ref 0.0–5.0)
HCT: 39.6 % (ref 36.0–46.0)
Hemoglobin: 13.6 g/dL (ref 12.0–15.0)
Lymphocytes Relative: 28.1 % (ref 12.0–46.0)
Lymphs Abs: 2.2 10*3/uL (ref 0.7–4.0)
MCHC: 34.4 g/dL (ref 30.0–36.0)
MCV: 90.6 fl (ref 78.0–100.0)
Monocytes Absolute: 0.6 10*3/uL (ref 0.1–1.0)
Monocytes Relative: 7.8 % (ref 3.0–12.0)
Neutro Abs: 4.8 10*3/uL (ref 1.4–7.7)
Neutrophils Relative %: 60.7 % (ref 43.0–77.0)
Platelets: 251 10*3/uL (ref 150.0–400.0)
RBC: 4.37 Mil/uL (ref 3.87–5.11)
RDW: 13.8 % (ref 11.5–15.5)
WBC: 7.9 10*3/uL (ref 4.0–10.5)

## 2018-12-05 LAB — LIPID PANEL
Cholesterol: 168 mg/dL (ref 0–200)
HDL: 49.7 mg/dL (ref >39.00)
LDL Cholesterol: 103 mg/dL — ABNORMAL HIGH (ref 0–99)
NonHDL: 118.75
Total CHOL/HDL Ratio: 3
Triglycerides: 81 mg/dL (ref 0.0–149.0)
VLDL: 16.2 mg/dL (ref 0.0–40.0)

## 2018-12-05 LAB — TSH: TSH: 1.24 u[IU]/mL (ref 0.35–4.50)

## 2018-12-05 LAB — HEMOGLOBIN A1C: Hgb A1c MFr Bld: 6 % (ref 4.6–6.5)

## 2018-12-05 NOTE — Telephone Encounter (Signed)
-----   Message from Ellamae Sia sent at 12/02/2018  2:24 PM EDT ----- Regarding: Lab orders for Monday, 5.4.20 Lab orders,thanks

## 2018-12-07 ENCOUNTER — Ambulatory Visit (INDEPENDENT_AMBULATORY_CARE_PROVIDER_SITE_OTHER): Payer: Medicare HMO | Admitting: Family Medicine

## 2018-12-07 ENCOUNTER — Encounter: Payer: Self-pay | Admitting: Family Medicine

## 2018-12-07 VITALS — BP 118/66 | Ht 60.5 in | Wt 164.0 lb

## 2018-12-07 DIAGNOSIS — R7303 Prediabetes: Secondary | ICD-10-CM

## 2018-12-07 DIAGNOSIS — Z6830 Body mass index (BMI) 30.0-30.9, adult: Secondary | ICD-10-CM

## 2018-12-07 DIAGNOSIS — I10 Essential (primary) hypertension: Secondary | ICD-10-CM | POA: Diagnosis not present

## 2018-12-07 DIAGNOSIS — E7800 Pure hypercholesterolemia, unspecified: Secondary | ICD-10-CM

## 2018-12-07 DIAGNOSIS — K219 Gastro-esophageal reflux disease without esophagitis: Secondary | ICD-10-CM | POA: Diagnosis not present

## 2018-12-07 DIAGNOSIS — E6609 Other obesity due to excess calories: Secondary | ICD-10-CM

## 2018-12-07 DIAGNOSIS — I63311 Cerebral infarction due to thrombosis of right middle cerebral artery: Secondary | ICD-10-CM

## 2018-12-07 DIAGNOSIS — M8580 Other specified disorders of bone density and structure, unspecified site: Secondary | ICD-10-CM | POA: Diagnosis not present

## 2018-12-07 MED ORDER — LOVASTATIN 20 MG PO TABS: 20.0000 mg | ORAL_TABLET | ORAL | 3 refills | Status: DC

## 2018-12-07 MED ORDER — HYDROCHLOROTHIAZIDE 25 MG PO TABS: 25.0000 mg | ORAL_TABLET | Freq: Every day | ORAL | 3 refills | Status: DC

## 2018-12-07 MED ORDER — FLUTICASONE PROPIONATE 50 MCG/ACT NA SUSP: 2.0000 | Freq: Every day | NASAL | 11 refills | Status: DC | PRN

## 2018-12-07 MED ORDER — OMEPRAZOLE 20 MG PO CPDR: 20.0000 mg | DELAYED_RELEASE_CAPSULE | ORAL | 3 refills | Status: DC

## 2018-12-07 NOTE — Assessment & Plan Note (Signed)
No residual effects Controlling bp and cholesterol Continues 325 mg asa daily

## 2018-12-07 NOTE — Assessment & Plan Note (Signed)
bp in fair control at this time  BP Readings from Last 1 Encounters:  12/07/18 118/66   No changes needed Most recent labs reviewed  Disc lifstyle change with low sodium diet and exercise

## 2018-12-07 NOTE — Assessment & Plan Note (Signed)
Disc goals for lipids and reasons to control them Rev last labs with pt Rev low sat fat diet in detail Stable and controlled with lovastatin and diet  Intolerant of atorvastatin in the past

## 2018-12-07 NOTE — Patient Instructions (Signed)
Continue good self care/diet and exercise  Try to get most of your carbohydrates from produce (with the exception of white potatoes)  Eat less bread/pasta/rice/snack foods/cereals/sweets and other items from the middle of the grocery store (processed carbs)   For cholesterol Avoid red meat/ fried foods/ egg yolks/ fatty breakfast meats/ butter, cheese and high fat dairy/ and shellfish    Get outdoors when able  No change in medications

## 2018-12-07 NOTE — Assessment & Plan Note (Signed)
Lab Results  Component Value Date   HGBA1C 6.0 12/05/2018   disc imp of low glycemic diet and wt loss to prevent DM2

## 2018-12-07 NOTE — Assessment & Plan Note (Signed)
Stable dexa 12/19 Good exercise No falls or fx Takes ca and D

## 2018-12-07 NOTE — Progress Notes (Signed)
Virtual Visit via Video Note  I connected with Brianna Espinoza on 12/07/18 at  8:00 AM EDT by a video enabled telemedicine application and verified that I am speaking with the correct person using two identifiers.  Location: Patient: home Provider: office Video quality is good    I discussed the limitations of evaluation and management by telemedicine and the availability of in person appointments. The patient expressed understanding and agreed to proceed.  History of Present Illness: Pt presents for f/u of chronic health problems  Allergies are bothersome-does ok  Plenty of exercise - works out in the yard - very large  Plays a little golf   Weight at home 164 lb Wt Readings from Last 3 Encounters:  12/02/17 157 lb 4 oz (71.3 kg)  11/24/17 156 lb 8 oz (71 kg)  09/30/17 158 lb 12 oz (72 kg)  gained a bit when husb was recovering from hip surgery Now eating less and exercising more  31.50 kg/m   She had amw on 5/1 with no issues   bp is stable today - at home 118/66 No cp or palpitations or headaches or edema  No side effects to medicines  BP Readings from Last 3 Encounters:  12/02/17 128/82  11/24/17 118/82  09/30/17 122/68     Hyperlipidemia Lab Results  Component Value Date   CHOL 168 12/05/2018   HDL 49.70 12/05/2018   LDLCALC 103 (H) 12/05/2018   TRIG 81.0 12/05/2018   CHOLHDL 3 12/05/2018  avoids beef  A little shrimp   Prediabetes  Lab Results  Component Value Date   HGBA1C 6.0 12/05/2018  up from 5.8  Eating very healthy Avoiding processed carbs  Lab Results  Component Value Date   TSH 1.24 12/05/2018    Lab Results  Component Value Date   CREATININE 0.83 12/05/2018   BUN 22 12/05/2018   NA 140 12/05/2018   K 3.7 12/05/2018   CL 101 12/05/2018   CO2 30 12/05/2018   Lab Results  Component Value Date   ALT 14 12/05/2018   AST 18 12/05/2018   ALKPHOS 71 12/05/2018   BILITOT 0.6 12/05/2018    Lab Results  Component Value Date   WBC  7.9 12/05/2018   HGB 13.6 12/05/2018   HCT 39.6 12/05/2018   MCV 90.6 12/05/2018   PLT 251.0 12/05/2018     dexa 12/19 Stable osteopenia  No falls or fractures  Takes vit D and calcium   zostavax 2015   Review of Systems  Constitutional: Negative for chills, fever, malaise/fatigue and weight loss.  HENT: Negative for hearing loss.   Eyes: Negative for blurred vision, discharge and redness.  Respiratory: Negative for cough and shortness of breath.   Cardiovascular: Negative for chest pain and palpitations.  Gastrointestinal: Negative for abdominal pain, heartburn, nausea and vomiting.  Musculoskeletal: Negative for myalgias.  Skin: Negative for rash.  Neurological: Negative for dizziness and headaches.  Endo/Heme/Allergies: Positive for environmental allergies.  Psychiatric/Behavioral: Negative for depression. The patient is not nervous/anxious.       Patient Active Problem List   Diagnosis Date Noted  . Grief reaction 12/13/2015  . Routine general medical examination at a health care facility 11/19/2015  . Estrogen deficiency 04/01/2015  . Cerebral infarction due to thrombosis of right middle cerebral artery ([place]) 12/17/2014  . Encounter for Medicare annual wellness exam 11/16/2014  . Prediabetes 11/16/2014  . Cerebral infarction ([place]) 10/03/2014  . Essential hypertension 10/03/2014  . Obesity 09/21/2011  . Osteopenia  03/12/2009  . Hyperlipidemia 12/13/2006  . ALLERGIC RHINITIS 12/13/2006  . GERD 12/13/2006  . OSTEOARTHRITIS 12/13/2006   Past Medical History:  Diagnosis Date  . Allergy    allergic rhinitis  . Arthritis    OA  . Fibrocystic breast   . GERD (gastroesophageal reflux disease)   . Hyperlipidemia   . Obesity   . Osteopenia   . Stroke [facility])    cerebral infarction  . Tibia fracture 2012   playing golf    Past Surgical History:  Procedure Laterality Date  . BREAST CYST ASPIRATION  1980s   aspirated breast lump  . COLONOSCOPY  2012  .  ESOPHAGOGASTRODUODENOSCOPY ENDOSCOPY  09/18/14  . INGUINAL HERNIA REPAIR Right 1956  . TUBAL LIGATION  1976   lap   Social History   Tobacco Use  . Smoking status: Never Smoker  . Smokeless tobacco: Never Used  Substance Use Topics  . Alcohol use: Yes    Alcohol/week: 3.0 standard drinks    Types: 3 Glasses of wine per week    Comment: occ-wine  . Drug use: No   Family History  Problem Relation Age of Onset  . Arthritis Mother   . Hyperlipidemia Mother   . Hypertension Mother   . COPD Mother   . Asthma Mother   . Stroke Father   . Heart disease Father   . Pancreatic cancer Maternal Grandfather   . Heart disease Maternal Grandfather   . Arthritis Maternal Grandmother   . Hypertension Maternal Grandmother   . Stroke Maternal Grandmother   . Colon cancer Neg Hx   . Esophageal cancer Neg Hx   . Rectal cancer Neg Hx   . Stomach cancer Neg Hx   . Breast cancer Neg Hx    Allergies  Allergen Reactions  . Buspar [Buspirone]     nausea  . Lipitor [Atorvastatin] Other (See Comments)    Muscle pain   . Tetanus Toxoid Hives  . Wellbutrin [Bupropion] Hives  . Acetaminophen Rash  . Septra [Sulfamethoxazole-Trimethoprim] Rash    Body rash   Current Outpatient Medications on File Prior to Visit  Medication Sig Dispense Refill  . aspirin EC 325 MG tablet Take 1 tablet (325 mg total) by mouth daily. 90 tablet 3  . Calcium Carbonate-Vit D-Min 600-400 MG-UNIT TABS Take 2 tablets by mouth daily.      . fexofenadine (ALLEGRA) 180 MG tablet Take 180 mg by mouth daily as needed (during allergy season).     . Multiple Vitamin (MULTIVITAMIN) tablet Take 1 tablet by mouth daily.       No current facility-administered medications on file prior to visit.     Observations/Objective: Pt appears well /overweight baseline  Mentally sharp/talkative and normal affect  No facial swelling or asymmetry  Voice is normal/not hoarse  No sob with speech  No coughing  Nl skin tone w/o flushing or  rash    Assessment and Plan: Problem List Items Addressed This Visit      Cardiovascular and Mediastinum   Essential hypertension - Primary    bp in fair control at this time  BP Readings from Last 1 Encounters:  12/07/18 118/66   No changes needed Most recent labs reviewed  Disc lifstyle change with low sodium diet and exercise        Relevant Medications   hydrochlorothiazide (HYDRODIURIL) 25 MG tablet   lovastatin (MEVACOR) 20 MG tablet     Digestive   GERD    Continues omeprazole QOD  Also watches diet  Well controlled       Relevant Medications   omeprazole (PRILOSEC) 20 MG capsule     Nervous and Auditory   Cerebral infarction ([place])    No residual effects Controlling bp and cholesterol Continues 325 mg asa daily        Musculoskeletal and Integument   Osteopenia    Stable dexa 12/19 Good exercise No falls or fx Takes ca and D         Other   Hyperlipidemia    Disc goals for lipids and reasons to control them Rev last labs with pt Rev low sat fat diet in detail Stable and controlled with lovastatin and diet  Intolerant of atorvastatin in the past       Relevant Medications   hydrochlorothiazide (HYDRODIURIL) 25 MG tablet   lovastatin (MEVACOR) 20 MG tablet   Obesity    bmi is 31.5 Discussed how this problem influences overall health and the risks it imposes  Reviewed plan for weight loss with lower calorie diet (via better food choices and also portion control or program like weight watchers) and exercise building up to or more than 30 minutes 5 days per week including some aerobic activity   Currently good habits      Prediabetes    Lab Results  Component Value Date   HGBA1C 6.0 12/05/2018   disc imp of low glycemic diet and wt loss to prevent DM2           Follow Up Instructions: Continue good self care/diet and exercise  Try to get most of your carbohydrates from produce (with the exception of white potatoes)  Eat less  bread/pasta/rice/snack foods/cereals/sweets and other items from the middle of the grocery store (processed carbs)   For cholesterol Avoid red meat/ fried foods/ egg yolks/ fatty breakfast meats/ butter, cheese and high fat dairy/ and shellfish    Get outdoors when able  No change in medications    I discussed the assessment and treatment plan with the patient. The patient was provided an opportunity to ask questions and all were answered. The patient agreed with the plan and demonstrated an understanding of the instructions.   The patient was advised to call back or seek an in-person evaluation if the symptoms worsen or if the condition fails to improve as anticipated.     Loura Pardon, MD

## 2018-12-07 NOTE — Assessment & Plan Note (Signed)
bmi is 31.5 Discussed how this problem influences overall health and the risks it imposes  Reviewed plan for weight loss with lower calorie diet (via better food choices and also portion control or program like weight watchers) and exercise building up to or more than 30 minutes 5 days per week including some aerobic activity   Currently good habits

## 2018-12-07 NOTE — Assessment & Plan Note (Signed)
Continues omeprazole QOD Also watches diet  Well controlled

## 2018-12-08 DIAGNOSIS — M159 Polyosteoarthritis, unspecified: Secondary | ICD-10-CM | POA: Diagnosis not present

## 2019-02-20 ENCOUNTER — Encounter: Payer: Medicare HMO | Admitting: Family Medicine

## 2019-02-23 DIAGNOSIS — H2513 Age-related nuclear cataract, bilateral: Secondary | ICD-10-CM | POA: Diagnosis not present

## 2019-03-15 DIAGNOSIS — Z7982 Long term (current) use of aspirin: Secondary | ICD-10-CM | POA: Diagnosis not present

## 2019-03-15 DIAGNOSIS — H60311 Diffuse otitis externa, right ear: Secondary | ICD-10-CM | POA: Diagnosis not present

## 2019-03-15 DIAGNOSIS — Z8673 Personal history of transient ischemic attack (TIA), and cerebral infarction without residual deficits: Secondary | ICD-10-CM | POA: Diagnosis not present

## 2019-04-25 ENCOUNTER — Ambulatory Visit (INDEPENDENT_AMBULATORY_CARE_PROVIDER_SITE_OTHER): Payer: Medicare HMO

## 2019-04-25 DIAGNOSIS — Z23 Encounter for immunization: Secondary | ICD-10-CM | POA: Diagnosis not present

## 2019-05-09 ENCOUNTER — Other Ambulatory Visit: Payer: Self-pay | Admitting: Family Medicine

## 2019-05-09 DIAGNOSIS — Z1231 Encounter for screening mammogram for malignant neoplasm of breast: Secondary | ICD-10-CM

## 2019-05-25 DIAGNOSIS — L821 Other seborrheic keratosis: Secondary | ICD-10-CM | POA: Diagnosis not present

## 2019-05-25 DIAGNOSIS — D2271 Melanocytic nevi of right lower limb, including hip: Secondary | ICD-10-CM | POA: Diagnosis not present

## 2019-05-25 DIAGNOSIS — L565 Disseminated superficial actinic porokeratosis (DSAP): Secondary | ICD-10-CM | POA: Diagnosis not present

## 2019-05-25 DIAGNOSIS — L820 Inflamed seborrheic keratosis: Secondary | ICD-10-CM | POA: Diagnosis not present

## 2019-05-25 DIAGNOSIS — D225 Melanocytic nevi of trunk: Secondary | ICD-10-CM | POA: Diagnosis not present

## 2019-05-25 DIAGNOSIS — D2272 Melanocytic nevi of left lower limb, including hip: Secondary | ICD-10-CM | POA: Diagnosis not present

## 2019-05-25 DIAGNOSIS — D2262 Melanocytic nevi of left upper limb, including shoulder: Secondary | ICD-10-CM | POA: Diagnosis not present

## 2019-05-25 DIAGNOSIS — X32XXXA Exposure to sunlight, initial encounter: Secondary | ICD-10-CM | POA: Diagnosis not present

## 2019-05-25 DIAGNOSIS — L538 Other specified erythematous conditions: Secondary | ICD-10-CM | POA: Diagnosis not present

## 2019-05-25 DIAGNOSIS — D2261 Melanocytic nevi of right upper limb, including shoulder: Secondary | ICD-10-CM | POA: Diagnosis not present

## 2019-06-26 ENCOUNTER — Other Ambulatory Visit: Payer: Self-pay

## 2019-06-26 ENCOUNTER — Ambulatory Visit
Admission: RE | Admit: 2019-06-26 | Discharge: 2019-06-26 | Disposition: A | Discharge status: Home / Self Care | Payer: Medicare HMO | Source: Ambulatory Visit | Attending: Family Medicine | Admitting: Family Medicine

## 2019-06-26 DIAGNOSIS — Z1231 Encounter for screening mammogram for malignant neoplasm of breast: Secondary | ICD-10-CM | POA: Diagnosis not present

## 2019-07-17 DIAGNOSIS — Z20828 Contact with and (suspected) exposure to other viral communicable diseases: Secondary | ICD-10-CM | POA: Diagnosis not present

## 2019-07-19 ENCOUNTER — Encounter: Payer: Self-pay | Admitting: Family Medicine

## 2019-07-20 ENCOUNTER — Encounter: Payer: Self-pay | Admitting: Family Medicine

## 2019-07-20 DIAGNOSIS — U071 COVID-19: Secondary | ICD-10-CM | POA: Insufficient documentation

## 2019-10-04 ENCOUNTER — Encounter: Payer: Self-pay | Admitting: Family Medicine

## 2019-10-21 ENCOUNTER — Other Ambulatory Visit: Payer: Self-pay | Admitting: Family Medicine

## 2019-10-23 NOTE — Telephone Encounter (Signed)
Last apt 12/07/18. Next apt 01/09/20.  Sent in script

## 2020-01-03 ENCOUNTER — Telehealth: Payer: Self-pay | Admitting: Family Medicine

## 2020-01-03 ENCOUNTER — Ambulatory Visit (INDEPENDENT_AMBULATORY_CARE_PROVIDER_SITE_OTHER): Payer: Medicare HMO

## 2020-01-03 DIAGNOSIS — R7303 Prediabetes: Secondary | ICD-10-CM

## 2020-01-03 DIAGNOSIS — Z0000 Encounter for general adult medical examination without abnormal findings: Secondary | ICD-10-CM

## 2020-01-03 DIAGNOSIS — I10 Essential (primary) hypertension: Secondary | ICD-10-CM

## 2020-01-03 DIAGNOSIS — E7800 Pure hypercholesterolemia, unspecified: Secondary | ICD-10-CM

## 2020-01-03 NOTE — Progress Notes (Signed)
PCP notes:  Health Maintenance: No gaps noted   Abnormal Screenings: None    Patient concerns: Discuss whether or not she needs to continue seeing a rheumatologist if she doesn't take any medicine.    Nurse concerns: none   Next PCP appt.: 01/09/2020 @ 2:30 pm

## 2020-01-03 NOTE — Telephone Encounter (Signed)
-----   Message from Cloyd Stagers, RT sent at 12/20/2019  1:49 PM EDT ----- Regarding: Lab Orders for Thursday 6.3.2021 Please place lab orders for Thursday 6.3.2021, office visit for physical on Tuesday 6.8.2021 Thank you, Dyke Maes RT(R)

## 2020-01-03 NOTE — Patient Instructions (Signed)
Ms. Brianna Espinoza , Thank you for taking time to come for your Medicare Wellness Visit. I appreciate your ongoing commitment to your health goals. Please review the following plan we discussed and let me know if I can assist you in the future.   Screening recommendations/referrals: Colonoscopy: Up to date, completed 06/22/2011 Mammogram: Up to date, completed 06/26/2019 Bone Density: Up to date, completed 07/21/2018 Recommended yearly ophthalmology/optometry visit for glaucoma screening and checkup Recommended yearly dental visit for hygiene and checkup  Vaccinations: Influenza vaccine: Up to date, completed 04/25/2019 Pneumococcal vaccine: Completed series Tdap vaccine: allergic Shingles vaccine: discussed    Advanced directives: Please bring a copy of your POA (Power of Attorney) and/or Living Will to your next appointment.   Conditions/risks identified: hypertension, hyperlipidemia  Next appointment: 01/09/2020 @ 2:30 pm    Preventive Care 65 Years and Older, Female Preventive care refers to lifestyle choices and visits with your health care provider that can promote health and wellness. What does preventive care include?  A yearly physical exam. This is also called an annual well check.  Dental exams once or twice a year.  Routine eye exams. Ask your health care provider how often you should have your eyes checked.  Personal lifestyle choices, including:  Daily care of your teeth and gums.  Regular physical activity.  Eating a healthy diet.  Avoiding tobacco and drug use.  Limiting alcohol use.  Practicing safe sex.  Taking low-dose aspirin every day.  Taking vitamin and mineral supplements as recommended by your health care provider. What happens during an annual well check? The services and screenings done by your health care provider during your annual well check will depend on your age, overall health, lifestyle risk factors, and family history of disease. Counseling    Your health care provider may ask you questions about your:  Alcohol use.  Tobacco use.  Drug use.  Emotional well-being.  Home and relationship well-being.  Sexual activity.  Eating habits.  History of falls.  Memory and ability to understand (cognition).  Work and work Statistician.  Reproductive health. Screening  You may have the following tests or measurements:  Height, weight, and BMI.  Blood pressure.  Lipid and cholesterol levels. These may be checked every 5 years, or more frequently if you are over 34 years old.  Skin check.  Lung cancer screening. You may have this screening every year starting at age 71 if you have a 30-pack-year history of smoking and currently smoke or have quit within the past 15 years.  Fecal occult blood test (FOBT) of the stool. You may have this test every year starting at age 43.  Flexible sigmoidoscopy or colonoscopy. You may have a sigmoidoscopy every 5 years or a colonoscopy every 10 years starting at age 7.  Hepatitis C blood test.  Hepatitis B blood test.  Sexually transmitted disease (STD) testing.  Diabetes screening. This is done by checking your blood sugar (glucose) after you have not eaten for a while (fasting). You may have this done every 1-3 years.  Bone density scan. This is done to screen for osteoporosis. You may have this done starting at age 64.  Mammogram. This may be done every 1-2 years. Talk to your health care provider about how often you should have regular mammograms. Talk with your health care provider about your test results, treatment options, and if necessary, the need for more tests. Vaccines  Your health care provider may recommend certain vaccines, such as:  Influenza vaccine.  This is recommended every year.  Tetanus, diphtheria, and acellular pertussis (Tdap, Td) vaccine. You may need a Td booster every 10 years.  Zoster vaccine. You may need this after age 58.  Pneumococcal 13-valent  conjugate (PCV13) vaccine. One dose is recommended after age 4.  Pneumococcal polysaccharide (PPSV23) vaccine. One dose is recommended after age 62. Talk to your health care provider about which screenings and vaccines you need and how often you need them. This information is not intended to replace advice given to you by your health care provider. Make sure you discuss any questions you have with your health care provider. Document Released: 08/16/2015 Document Revised: 04/08/2016 Document Reviewed: 05/21/2015 Elsevier Interactive Patient Education  2017 [place] Prevention in the Home Falls can cause injuries. They can happen to people of all ages. There are many things you can do to make your home safe and to help prevent falls. What can I do on the outside of my home?  Regularly fix the edges of walkways and driveways and fix any cracks.  Remove anything that might make you trip as you walk through a door, such as a raised step or threshold.  Trim any bushes or trees on the path to your home.  Use bright outdoor lighting.  Clear any walking paths of anything that might make someone trip, such as rocks or tools.  Regularly check to see if handrails are loose or broken. Make sure that both sides of any steps have handrails.  Any raised decks and porches should have guardrails on the edges.  Have any leaves, snow, or ice cleared regularly.  Use sand or salt on walking paths during winter.  Clean up any spills in your garage right away. This includes oil or grease spills. What can I do in the bathroom?  Use night lights.  Install grab bars by the toilet and in the tub and shower. Do not use towel bars as grab bars.  Use non-skid mats or decals in the tub or shower.  If you need to sit down in the shower, use a plastic, non-slip stool.  Keep the floor dry. Clean up any water that spills on the floor as soon as it happens.  Remove soap buildup in the tub or  shower regularly.  Attach bath mats securely with double-sided non-slip rug tape.  Do not have throw rugs and other things on the floor that can make you trip. What can I do in the bedroom?  Use night lights.  Make sure that you have a light by your bed that is easy to reach.  Do not use any sheets or blankets that are too big for your bed. They should not hang down onto the floor.  Have a firm chair that has side arms. You can use this for support while you get dressed.  Do not have throw rugs and other things on the floor that can make you trip. What can I do in the kitchen?  Clean up any spills right away.  Avoid walking on wet floors.  Keep items that you use a lot in easy-to-reach places.  If you need to reach something above you, use a strong step stool that has a grab bar.  Keep electrical cords out of the way.  Do not use floor polish or wax that makes floors slippery. If you must use wax, use non-skid floor wax.  Do not have throw rugs and other things on the floor that can make  you trip. What can I do with my stairs?  Do not leave any items on the stairs.  Make sure that there are handrails on both sides of the stairs and use them. Fix handrails that are broken or loose. Make sure that handrails are as long as the stairways.  Check any carpeting to make sure that it is firmly attached to the stairs. Fix any carpet that is loose or worn.  Avoid having throw rugs at the top or bottom of the stairs. If you do have throw rugs, attach them to the floor with carpet tape.  Make sure that you have a light switch at the top of the stairs and the bottom of the stairs. If you do not have them, ask someone to add them for you. What else can I do to help prevent falls?  Wear shoes that:  Do not have high heels.  Have rubber bottoms.  Are comfortable and fit you well.  Are closed at the toe. Do not wear sandals.  If you use a stepladder:  Make sure that it is fully  opened. Do not climb a closed stepladder.  Make sure that both sides of the stepladder are locked into place.  Ask someone to hold it for you, if possible.  Clearly mark and make sure that you can see:  Any grab bars or handrails.  First and last steps.  Where the edge of each step is.  Use tools that help you move around (mobility aids) if they are needed. These include:  Canes.  Walkers.  Scooters.  Crutches.  Turn on the lights when you go into a dark area. Replace any light bulbs as soon as they burn out.  Set up your furniture so you have a clear path. Avoid moving your furniture around.  If any of your floors are uneven, fix them.  If there are any pets around you, be aware of where they are.  Review your medicines with your doctor. Some medicines can make you feel dizzy. This can increase your chance of falling. Ask your doctor what other things that you can do to help prevent falls. This information is not intended to replace advice given to you by your health care provider. Make sure you discuss any questions you have with your health care provider. Document Released: 05/16/2009 Document Revised: 12/26/2015 Document Reviewed: 08/24/2014 Elsevier Interactive Patient Education  2017 [employer].

## 2020-01-03 NOTE — Progress Notes (Signed)
Subjective:   Brianna Espinoza is a 69 y.o. female who presents for Medicare Annual (Subsequent) preventive examination.  Review of Systems: N/A   I connected with the patient today by telephone and verified that I am speaking with the correct person using two identifiers. Location patient: home Location nurse: work Persons participating in the virtual visit: patient, Marine scientist.   I discussed the limitations, risks, security and privacy concerns of performing an evaluation and management service by telephone and the availability of in person appointments. I also discussed with the patient that there may be a patient responsible charge related to this service. The patient expressed understanding and verbally consented to this telephonic visit.    Interactive audio and video telecommunications were attempted between this nurse and patient, however failed, due to patient having technical difficulties OR patient did not have access to video capability.  We continued and completed visit with audio only.     Cardiac Risk Factors include: advanced age (>36men, >66 women);hypertension;dyslipidemia     Objective:     Vitals: There were no vitals taken for this visit.  There is no height or weight on file to calculate BMI.  Advanced Directives 01/03/2020 12/02/2018 11/24/2017 11/23/2016 12/17/2014  Does Patient Have a Medical Advance Directive? Yes Yes Yes Yes Yes  Type of Paramedic of [place];Living will [place];Living will [place];Living will Living will;Healthcare Power of [place];Living will  Does patient want to make changes to medical advance directive? - - No - Patient declined - -  Copy of [place] in Chart? No - copy requested No - copy requested No - copy requested No - copy requested No - copy requested    Tobacco Social History   Tobacco Use  Smoking Status Never Smoker    Smokeless Tobacco Never Used     Counseling given: Not Answered   Clinical Intake:  Pre-visit preparation completed: Yes  Pain : No/denies pain     Nutritional Risks: None Diabetes: No  How often do you need to have someone help you when you read instructions, pamphlets, or other written materials from your doctor or pharmacy?: 1 - Never What is the last grade level you completed in school?: some college  Interpreter Needed?: No  Information entered by :: CJohnson, LPN  Past Medical History:  Diagnosis Date  . Allergy    allergic rhinitis  . Arthritis    OA  . Fibrocystic breast   . GERD (gastroesophageal reflux disease)   . Hyperlipidemia   . Obesity   . Osteopenia   . Stroke [facility]    cerebral infarction  . Tibia fracture 2012   playing golf    Past Surgical History:  Procedure Laterality Date  . BREAST CYST ASPIRATION  1980s   aspirated breast lump  . COLONOSCOPY  2012  . ESOPHAGOGASTRODUODENOSCOPY ENDOSCOPY  09/18/14  . INGUINAL HERNIA REPAIR Right 1956  . TUBAL LIGATION  1976   lap   Family History  Problem Relation Age of Onset  . Arthritis Mother   . Hyperlipidemia Mother   . Hypertension Mother   . COPD Mother   . Asthma Mother   . Stroke Father   . Heart disease Father   . Pancreatic cancer Maternal Grandfather   . Heart disease Maternal Grandfather   . Arthritis Maternal Grandmother   . Hypertension Maternal Grandmother   . Stroke Maternal Grandmother   . Colon cancer  Neg Hx   . Esophageal cancer Neg Hx   . Rectal cancer Neg Hx   . Stomach cancer Neg Hx   . Breast cancer Neg Hx    Social History   Socioeconomic History  . Marital status: Married    Spouse name: Not on file  . Number of children: 1  . Years of education: 60  . Highest education level: Not on file  Occupational History  . Occupation: retired  Tobacco Use  . Smoking status: Never Smoker  . Smokeless tobacco: Never Used  Substance and Sexual Activity  . Alcohol  use: Yes    Alcohol/week: 3.0 standard drinks    Types: 3 Glasses of wine per week    Comment: occ-wine  . Drug use: No  . Sexual activity: Not on file  Other Topics Concern  . Not on file  Social History Narrative   Patient is married with one child.   Patient is left handed.   Patient has 14 yrs of education.   Patient drinks 1 and 1/2 cup daily.   Social Determinants of Health   Financial Resource Strain: Low Risk   . Difficulty of Paying Living Expenses: Not hard at all  Food Insecurity: No Food Insecurity  . Worried About Charity fundraiser in the Last Year: Never true  . Ran Out of Food in the Last Year: Never true  Transportation Needs: No Transportation Needs  . Lack of Transportation (Medical): No  . Lack of Transportation (Non-Medical): No  Physical Activity: Sufficiently Active  . Days of Exercise per Week: 2 days  . Minutes of Exercise per Session: 120 min  Stress: No Stress Concern Present  . Feeling of Stress : Not at all  Social Connections:   . Frequency of Communication with Friends and Family:   . Frequency of Social Gatherings with Friends and Family:   . Attends Religious Services:   . Active Member of Clubs or Organizations:   . Attends Archivist Meetings:   Marland Kitchen Marital Status:     Outpatient Encounter Medications as of 01/03/2020  Medication Sig  . aspirin EC 325 MG tablet Take 1 tablet (325 mg total) by mouth daily.  . Calcium Carbonate-Vit D-Min 600-400 MG-UNIT TABS Take 2 tablets by mouth daily.    . fexofenadine (ALLEGRA) 180 MG tablet Take 180 mg by mouth daily as needed (during allergy season).   . fluticasone (FLONASE) 50 MCG/ACT nasal spray Place 2 sprays into both nostrils daily as needed for allergies or rhinitis.  . hydrochlorothiazide (HYDRODIURIL) 25 MG tablet TAKE 1 TABLET BY MOUTH ONCE DAILY  . lovastatin (MEVACOR) 20 MG tablet Take 1 tablet (20 mg total) by mouth every other day.  . Multiple Vitamin (MULTIVITAMIN) tablet Take  1 tablet by mouth daily.    Marland Kitchen omeprazole (PRILOSEC) 20 MG capsule Take 1 capsule (20 mg total) by mouth every other day.   No facility-administered encounter medications on file as of 01/03/2020.    Activities of Daily Living In your present state of health, do you have any difficulty performing the following activities: 01/03/2020  Hearing? N  Vision? N  Difficulty concentrating or making decisions? N  Walking or climbing stairs? N  Dressing or bathing? N  Doing errands, shopping? N  Preparing Food and eating ? N  Using the Toilet? N  In the past six months, have you accidently leaked urine? N  Do you have problems with loss of bowel control? N  Managing  your Medications? N  Managing your Finances? N  Housekeeping or managing your Housekeeping? N  Some recent data might be hidden    Patient Care Team: Tower, Wynelle Fanny, MD as PCP - General Edison Pace, Josie Saunders, MD as Consulting Physician (Ophthalmology)    Assessment:   This is a routine wellness examination for [place].  Exercise Activities and Dietary recommendations Current Exercise Habits: Home exercise routine, Type of exercise: Other - see comments(golf), Time (Minutes): 60, Frequency (Times/Week): 2, Weekly Exercise (Minutes/Week): 120, Intensity: Moderate, Exercise limited by: None identified  Goals    . Increase physical activity     Starting 12/02/2018, I will continue to exercise for 45 minutes daily.     . Patient Stated     01/03/2020, I will continue to play golf 2 days a week for over 1 hour.        Fall Risk Fall Risk  01/03/2020 12/02/2018 11/24/2017 11/23/2016 06/15/2016  Falls in the past year? 0 0 Yes No No  Comment - - fell while doing gardening - -  Number falls in past yr: 0 - 1 - -  Injury with Fall? 0 - No - -  Risk for fall due to : Medication side effect - - - -  Follow up Falls evaluation completed;Falls prevention discussed - - - -   Is the patient's home free of loose throw rugs in walkways, pet beds,  electrical cords, etc?   yes      Grab bars in the bathroom? yes      Handrails on the stairs?   yes      Adequate lighting?   yes  Timed Get Up and Go performed: N/A  Depression Screen PHQ 2/9 Scores 01/03/2020 12/02/2018 11/24/2017 11/23/2016  PHQ - 2 Score 0 0 2 2  PHQ- 9 Score 0 0 2 3     Cognitive Function MMSE - Mini Mental State Exam 01/03/2020 12/02/2018 11/24/2017 11/23/2016  Orientation to time 5 5 5 5   Orientation to Place 5 5 5 5   Registration 3 3 3 3   Attention/ Calculation 5 0 0 0  Recall 3 3 3 3   Language- name 2 objects - 0 0 0  Language- repeat 1 1 1 1   Language- follow 3 step command - 0 3 3  Language- read & follow direction - 0 0 0  Write a sentence - 0 0 0  Copy design - 0 0 0  Total score - 17 20 20   Mini Cog  Mini-Cog screen was completed. Maximum score is 22. A value of 0 denotes this part of the MMSE was not completed or the patient failed this part of the Mini-Cog screening.       Immunization History  Administered Date(s) Administered  . Influenza Split 04/29/2011, 07/20/2012  . Influenza,inj,Quad PF,6+ Mos 05/30/2014, 05/24/2015, 06/02/2016, 06/08/2017, 06/09/2018, 04/25/2019  . Influenza-Unspecified 05/25/2013  . PFIZER SARS-COV-2 Vaccination 09/12/2019, 10/03/2019  . Pneumococcal Conjugate-13 11/16/2014  . Pneumococcal Polysaccharide-23 11/19/2015  . Zoster 02/21/2014    Qualifies for Shingles Vaccine: yes  Screening Tests Health Maintenance  Topic Date Due  . INFLUENZA VACCINE  03/03/2020  . COLONOSCOPY  06/21/2021  . MAMMOGRAM  06/25/2021  . TETANUS/TDAP  09/20/2021  . DEXA SCAN  Completed  . COVID-19 Vaccine  Completed  . Hepatitis C Screening  Completed  . PNA vac Low Risk Adult  Completed    Cancer Screenings: Lung: Low Dose CT Chest recommended if Age 106-80 years, 30 pack-year currently  smoking OR have quit w/in 15 years. Patient does not qualify. Breast:  Up to date on Mammogram: Yes, completed 06/26/2019   Up to date of Bone  Density/Dexa: Yes, completed 07/21/2018 Colorectal: completed 06/22/2011  Additional Screenings:  Hepatitis C Screening: 11/23/2016     Plan:    Patient will continue to play golf 2 days a week for over 1 hour.    I have personally reviewed and noted the following in the patient's chart:   . Medical and social history . Use of alcohol, tobacco or illicit drugs  . Current medications and supplements . Functional ability and status . Nutritional status . Physical activity . Advanced directives . List of other physicians . Hospitalizations, surgeries, and ER visits in previous 12 months . Vitals . Screenings to include cognitive, depression, and falls . Referrals and appointments  In addition, I have reviewed and discussed with patient certain preventive protocols, quality metrics, and best practice recommendations. A written personalized care plan for preventive services as well as general preventive health recommendations were provided to patient.     Andrez Grime, LPN  [phone]

## 2020-01-04 ENCOUNTER — Other Ambulatory Visit (INDEPENDENT_AMBULATORY_CARE_PROVIDER_SITE_OTHER): Payer: Medicare HMO

## 2020-01-04 DIAGNOSIS — I10 Essential (primary) hypertension: Secondary | ICD-10-CM

## 2020-01-04 DIAGNOSIS — R7303 Prediabetes: Secondary | ICD-10-CM

## 2020-01-04 DIAGNOSIS — E7800 Pure hypercholesterolemia, unspecified: Secondary | ICD-10-CM

## 2020-01-04 LAB — CBC WITH DIFFERENTIAL/PLATELET
Basophils Absolute: 0.1 10*3/uL (ref 0.0–0.1)
Basophils Relative: 0.7 % (ref 0.0–3.0)
Eosinophils Absolute: 0.2 10*3/uL (ref 0.0–0.7)
Eosinophils Relative: 2.6 % (ref 0.0–5.0)
HCT: 39.4 % (ref 36.0–46.0)
Hemoglobin: 13.4 g/dL (ref 12.0–15.0)
Lymphocytes Relative: 30.9 % (ref 12.0–46.0)
Lymphs Abs: 2.4 10*3/uL (ref 0.7–4.0)
MCHC: 33.9 g/dL (ref 30.0–36.0)
MCV: 92.9 fl (ref 78.0–100.0)
Monocytes Absolute: 0.5 10*3/uL (ref 0.1–1.0)
Monocytes Relative: 6.7 % (ref 3.0–12.0)
Neutro Abs: 4.7 10*3/uL (ref 1.4–7.7)
Neutrophils Relative %: 59.1 % (ref 43.0–77.0)
Platelets: 262 10*3/uL (ref 150.0–400.0)
RBC: 4.24 Mil/uL (ref 3.87–5.11)
RDW: 14.4 % (ref 11.5–15.5)
WBC: 7.9 10*3/uL (ref 4.0–10.5)

## 2020-01-04 LAB — TSH: TSH: 1.28 u[IU]/mL (ref 0.35–4.50)

## 2020-01-04 LAB — COMPREHENSIVE METABOLIC PANEL
ALT: 12 U/L (ref 0–35)
AST: 16 U/L (ref 0–37)
Albumin: 4 g/dL (ref 3.5–5.2)
Alkaline Phosphatase: 59 U/L (ref 39–117)
BUN: 16 mg/dL (ref 6–23)
CO2: 31 mEq/L (ref 19–32)
Calcium: 9.1 mg/dL (ref 8.4–10.5)
Chloride: 102 mEq/L (ref 96–112)
Creatinine, Ser: 0.84 mg/dL (ref 0.40–1.20)
GFR: 66.78 mL/min (ref >60.00)
Glucose, Bld: 96 mg/dL (ref 70–99)
Potassium: 3.4 mEq/L — ABNORMAL LOW (ref 3.5–5.1)
Sodium: 140 mEq/L (ref 135–145)
Total Bilirubin: 0.5 mg/dL (ref 0.2–1.2)
Total Protein: 6.6 g/dL (ref 6.0–8.3)

## 2020-01-04 LAB — LIPID PANEL
Cholesterol: 164 mg/dL (ref 0–200)
HDL: 51 mg/dL (ref >39.00)
LDL Cholesterol: 97 mg/dL (ref 0–99)
NonHDL: 112.52
Total CHOL/HDL Ratio: 3
Triglycerides: 77 mg/dL (ref 0.0–149.0)
VLDL: 15.4 mg/dL (ref 0.0–40.0)

## 2020-01-04 LAB — HEMOGLOBIN A1C: Hgb A1c MFr Bld: 5.9 % (ref 4.6–6.5)

## 2020-01-09 ENCOUNTER — Other Ambulatory Visit: Payer: Self-pay

## 2020-01-09 ENCOUNTER — Encounter: Payer: Self-pay | Admitting: Family Medicine

## 2020-01-09 ENCOUNTER — Ambulatory Visit (INDEPENDENT_AMBULATORY_CARE_PROVIDER_SITE_OTHER): Payer: Medicare HMO | Admitting: Family Medicine

## 2020-01-09 VITALS — BP 130/78 | HR 77 | Temp 96.9°F | Ht 60.25 in | Wt 160.6 lb

## 2020-01-09 DIAGNOSIS — E6609 Other obesity due to excess calories: Secondary | ICD-10-CM

## 2020-01-09 DIAGNOSIS — I10 Essential (primary) hypertension: Secondary | ICD-10-CM | POA: Diagnosis not present

## 2020-01-09 DIAGNOSIS — E7800 Pure hypercholesterolemia, unspecified: Secondary | ICD-10-CM

## 2020-01-09 DIAGNOSIS — R7303 Prediabetes: Secondary | ICD-10-CM | POA: Diagnosis not present

## 2020-01-09 DIAGNOSIS — M8589 Other specified disorders of bone density and structure, multiple sites: Secondary | ICD-10-CM

## 2020-01-09 DIAGNOSIS — Z6831 Body mass index (BMI) 31.0-31.9, adult: Secondary | ICD-10-CM

## 2020-01-09 DIAGNOSIS — Z0000 Encounter for general adult medical examination without abnormal findings: Secondary | ICD-10-CM

## 2020-01-09 DIAGNOSIS — Z8673 Personal history of transient ischemic attack (TIA), and cerebral infarction without residual deficits: Secondary | ICD-10-CM

## 2020-01-09 MED ORDER — LOVASTATIN 20 MG PO TABS: 20.0000 mg | ORAL_TABLET | ORAL | 3 refills | Status: DC

## 2020-01-09 MED ORDER — OMEPRAZOLE 20 MG PO CPDR: 20.0000 mg | DELAYED_RELEASE_CAPSULE | ORAL | 3 refills | Status: DC

## 2020-01-09 NOTE — Assessment & Plan Note (Signed)
bp in fair control at this time  BP Readings from Last 1 Encounters:  01/09/20 130/78   No changes needed Most recent labs reviewed  Disc lifstyle change with low sodium diet and exercise

## 2020-01-09 NOTE — Assessment & Plan Note (Signed)
Reviewed dexa 12/19  Due 12/21-pt will call then for ref No falls or fx Taking ca and D Good exercise habits

## 2020-01-09 NOTE — Assessment & Plan Note (Signed)
Controlled with lovastatin and diet  Disc goals for lipids and reasons to control them Rev last labs with pt Rev low sat fat diet in detail LDL of 97  Past h/o cva

## 2020-01-09 NOTE — Assessment & Plan Note (Signed)
Discussed how this problem influences overall health and the risks it imposes  Reviewed plan for weight loss with lower calorie diet (via better food choices and also portion control or program like weight watchers) and exercise building up to or more than 30 minutes 5 days per week including some aerobic activity    

## 2020-01-09 NOTE — Patient Instructions (Addendum)
If you are interested in the new shingles vaccine (Shingrix) - call your local pharmacy to check on coverage and availability  If affordable, get on a wait list at your pharmacy to get the vaccine.   Your potassium is slightly low  Eat high potassium foods - like oranges and bananas, dark leafy greens    Take care of yourself   Your bone density test can be done December or later Call us for a referral when you need it

## 2020-01-09 NOTE — Assessment & Plan Note (Signed)
No clinical changes Continue asa and bp /cholesterol control

## 2020-01-09 NOTE — Progress Notes (Signed)
Subjective:    Patient ID: Brianna Espinoza, female    DOB: 09-Jun-1951, 69 y.o.   MRN: [id number]  This visit occurred during the SARS-CoV-2 public health emergency.  Safety protocols were in place, including screening questions prior to the visit, additional usage of staff PPE, and extensive cleaning of exam room while observing appropriate contact time as indicated for disinfecting solutions.    HPI Here for health maintenance exam and to review chronic medical problems   Had amw on 6/2 No care gaps noted   Wt Readings from Last 3 Encounters:  01/09/20 160 lb 9 oz (72.8 kg)  12/07/18 164 lb (74.4 kg)  12/02/17 157 lb 4 oz (71.3 kg)   31.10 kg/m   doing great overall  Taking care of herself  Went to the beach for 2 weeks   Exercise-walking and working in yard and walking   Colonoscopy 11/12  Mammogram 11/20 Self breast exam - no lumps   dexa 12/19 -stable osteopenia Falls -none Fractures-none Supplements- taking D and Ca  Exercise -is good   covid-immunized   (also had a mild case)  zostavax 7/15  HTN bp is stable today  No cp or palpitations or headaches or edema  No side effects to medicines  BP Readings from Last 3 Encounters:  01/09/20 130/78  12/07/18 118/66  12/02/17 128/82    History of CVA Pulse Readings from Last 3 Encounters:  01/09/20 77  12/02/17 80  11/24/17 72    Prediabetes Lab Results  Component Value Date   HGBA1C 5.9 01/04/2020  eating healthy overall    Hyperlipidemia Lab Results  Component Value Date   CHOL 164 01/04/2020   CHOL 168 12/05/2018   CHOL 168 11/24/2017   Lab Results  Component Value Date   HDL 51.00 01/04/2020   HDL 49.70 12/05/2018   HDL 52.70 11/24/2017   Lab Results  Component Value Date   LDLCALC 97 01/04/2020   LDLCALC 103 (H) 12/05/2018   LDLCALC 101 (H) 11/24/2017   Lab Results  Component Value Date   TRIG 77.0 01/04/2020   TRIG 81.0 12/05/2018   TRIG 71.0 11/24/2017   Lab Results    Component Value Date   CHOLHDL 3 01/04/2020   CHOLHDL 3 12/05/2018   CHOLHDL 3 11/24/2017   No results found for: LDLDIRECT Lovastatin and diet  Watches diet  No greasy food or fast food  Does not eat beef   Lab Results  Component Value Date   NA 140 01/04/2020   K 3.4 (L) 01/04/2020   CO2 31 01/04/2020   GLUCOSE 96 01/04/2020   BUN 16 01/04/2020   CREATININE 0.84 01/04/2020   CALCIUM 9.1 01/04/2020   Lab Results  Component Value Date   WBC 7.9 01/04/2020   HGB 13.4 01/04/2020   HCT 39.4 01/04/2020   MCV 92.9 01/04/2020   PLT 262.0 01/04/2020   Lab Results  Component Value Date   TSH 1.28 01/04/2020   Lab Results  Component Value Date   ALT 12 01/04/2020   AST 16 01/04/2020   ALKPHOS 59 01/04/2020   BILITOT 0.5 01/04/2020     Patient Active Problem List   Diagnosis Date Noted  . Grief reaction 12/13/2015  . Routine general medical examination at a health care facility 11/19/2015  . Estrogen deficiency 04/01/2015  . Encounter for Medicare annual wellness exam 11/16/2014  . Prediabetes 11/16/2014  . History of cerebral infarction 10/03/2014  . Essential hypertension 10/03/2014  . Obesity  09/21/2011  . Osteopenia 03/12/2009  . Hyperlipidemia 12/13/2006  . ALLERGIC RHINITIS 12/13/2006  . GERD 12/13/2006  . OSTEOARTHRITIS 12/13/2006   Past Medical History:  Diagnosis Date  . Allergy    allergic rhinitis  . Arthritis    OA  . Fibrocystic breast   . GERD (gastroesophageal reflux disease)   . Hyperlipidemia   . Obesity   . Osteopenia   . Stroke [facility])    cerebral infarction  . Tibia fracture 2012   playing golf    Past Surgical History:  Procedure Laterality Date  . BREAST CYST ASPIRATION  1980s   aspirated breast lump  . COLONOSCOPY  2012  . ESOPHAGOGASTRODUODENOSCOPY ENDOSCOPY  09/18/14  . INGUINAL HERNIA REPAIR Right 1956  . TUBAL LIGATION  1976   lap   Social History   Tobacco Use  . Smoking status: Never Smoker  . Smokeless tobacco:  Never Used  Substance Use Topics  . Alcohol use: Yes    Alcohol/week: 3.0 standard drinks    Types: 3 Glasses of wine per week    Comment: occ-wine  . Drug use: No   Family History  Problem Relation Age of Onset  . Arthritis Mother   . Hyperlipidemia Mother   . Hypertension Mother   . COPD Mother   . Asthma Mother   . Stroke Father   . Heart disease Father   . Pancreatic cancer Maternal Grandfather   . Heart disease Maternal Grandfather   . Arthritis Maternal Grandmother   . Hypertension Maternal Grandmother   . Stroke Maternal Grandmother   . Colon cancer Neg Hx   . Esophageal cancer Neg Hx   . Rectal cancer Neg Hx   . Stomach cancer Neg Hx   . Breast cancer Neg Hx    Allergies  Allergen Reactions  . Buspar [Buspirone]     nausea  . Lipitor [Atorvastatin] Other (See Comments)    Muscle pain   . Tetanus Toxoid Hives  . Wellbutrin [Bupropion] Hives  . Acetaminophen Rash  . Septra [Sulfamethoxazole-Trimethoprim] Rash    Body rash   Current Outpatient Medications on File Prior to Visit  Medication Sig Dispense Refill  . aspirin EC 325 MG tablet Take 1 tablet (325 mg total) by mouth daily. 90 tablet 3  . Calcium Carbonate-Vit D-Min 600-400 MG-UNIT TABS Take 2 tablets by mouth daily.      . fexofenadine (ALLEGRA) 180 MG tablet Take 180 mg by mouth daily as needed (during allergy season).     . fluticasone (FLONASE) 50 MCG/ACT nasal spray Place 2 sprays into both nostrils daily as needed for allergies or rhinitis. 16 g 11  . hydrochlorothiazide (HYDRODIURIL) 25 MG tablet TAKE 1 TABLET BY MOUTH ONCE DAILY 90 tablet 3  . Multiple Vitamin (MULTIVITAMIN) tablet Take 1 tablet by mouth daily.       No current facility-administered medications on file prior to visit.    Review of Systems  Constitutional: Negative for activity change, appetite change, fatigue, fever and unexpected weight change.  HENT: Negative for congestion, ear pain, rhinorrhea, sinus pressure and sore  throat.   Eyes: Negative for pain, redness and visual disturbance.  Respiratory: Negative for cough, shortness of breath and wheezing.   Cardiovascular: Negative for chest pain and palpitations.  Gastrointestinal: Negative for abdominal pain, blood in stool, constipation and diarrhea.  Endocrine: Negative for polydipsia and polyuria.  Genitourinary: Negative for dysuria, frequency and urgency.  Musculoskeletal: Negative for arthralgias, back pain and myalgias.  Skin:  Negative for pallor and rash.  Allergic/Immunologic: Negative for environmental allergies.  Neurological: Negative for dizziness, syncope and headaches.  Hematological: Negative for adenopathy. Does not bruise/bleed easily.  Psychiatric/Behavioral: Negative for decreased concentration and dysphoric mood. The patient is not nervous/anxious.        Objective:   Physical Exam Constitutional:      General: She is not in acute distress.    Appearance: Normal appearance. She is well-developed. She is obese. She is not ill-appearing or diaphoretic.  HENT:     Head: Normocephalic and atraumatic.     Right Ear: Tympanic membrane, ear canal and external ear normal.     Left Ear: Tympanic membrane, ear canal and external ear normal.     Nose: Nose normal. No congestion.     Mouth/Throat:     Mouth: Mucous membranes are moist.     Pharynx: Oropharynx is clear. No posterior oropharyngeal erythema.  Eyes:     General: No scleral icterus.    Extraocular Movements: Extraocular movements intact.     Conjunctiva/sclera: Conjunctivae normal.     Pupils: Pupils are equal, round, and reactive to light.  Neck:     Thyroid: No thyromegaly.     Vascular: No carotid bruit or JVD.  Cardiovascular:     Rate and Rhythm: Normal rate and regular rhythm.     Pulses: Normal pulses.     Heart sounds: Normal heart sounds. No gallop.   Pulmonary:     Effort: Pulmonary effort is normal. No respiratory distress.     Breath sounds: Normal breath  sounds. No wheezing.     Comments: Good air exch Chest:     Chest wall: No tenderness.  Abdominal:     General: Bowel sounds are normal. There is no distension or abdominal bruit.     Palpations: Abdomen is soft. There is no mass.     Tenderness: There is no abdominal tenderness.     Hernia: No hernia is present.  Genitourinary:    Comments: Breast exam: No mass, nodules, thickening, tenderness, bulging, retraction, inflamation, nipple discharge or skin changes noted.  No axillary or clavicular LA.     Musculoskeletal:        General: No tenderness. Normal range of motion.     Cervical back: Normal range of motion and neck supple. No rigidity. No muscular tenderness.     Right lower leg: No edema.     Left lower leg: No edema.  Lymphadenopathy:     Cervical: No cervical adenopathy.  Skin:    General: Skin is warm and dry.     Coloration: Skin is not pale.     Findings: No erythema or rash.     Comments: Solar lentigines diffusely Some darker flat nevi on back   Neurological:     Mental Status: She is alert. Mental status is at baseline.     Cranial Nerves: No cranial nerve deficit.     Motor: No abnormal muscle tone.     Coordination: Coordination normal.     Gait: Gait normal.     Deep Tendon Reflexes: Reflexes are normal and symmetric. Reflexes normal.  Psychiatric:        Mood and Affect: Mood normal.        Cognition and Memory: Cognition and memory normal.           Assessment & Plan:   Problem List Items Addressed This Visit      Cardiovascular and Mediastinum   Essential  hypertension    bp in fair control at this time  BP Readings from Last 1 Encounters:  01/09/20 130/78   No changes needed Most recent labs reviewed  Disc lifstyle change with low sodium diet and exercise        Relevant Medications   lovastatin (MEVACOR) 20 MG tablet     Nervous and Auditory   History of cerebral infarction    No clinical changes Continue asa and bp /cholesterol  control         Musculoskeletal and Integument   Osteopenia    Reviewed dexa 12/19  Due 12/21-pt will call then for ref No falls or fx Taking ca and D Good exercise habits          Other   Hyperlipidemia    Controlled with lovastatin and diet  Disc goals for lipids and reasons to control them Rev last labs with pt Rev low sat fat diet in detail LDL of 97  Past h/o cva        Relevant Medications   lovastatin (MEVACOR) 20 MG tablet   Obesity    Discussed how this problem influences overall health and the risks it imposes  Reviewed plan for weight loss with lower calorie diet (via better food choices and also portion control or program like weight watchers) and exercise building up to or more than 30 minutes 5 days per week including some aerobic activity         Prediabetes    Lab Results  Component Value Date   HGBA1C 5.9 01/04/2020   Controlled with diet/exercise disc imp of low glycemic diet and wt loss to prevent DM2       Routine general medical examination at a health care facility - Primary    Reviewed health habits including diet and exercise and skin cancer prevention Reviewed appropriate screening tests for age  Also reviewed health mt list, fam hx and immunization status , as well as social and family history   See HPI Labs reviewed  amw reviewed  dexa due 12/21-will call for ref (no falls or fx)  Immunized for covid  Recommended shingrix if affordable  Good health habits

## 2020-01-09 NOTE — Assessment & Plan Note (Signed)
Reviewed health habits including diet and exercise and skin cancer prevention Reviewed appropriate screening tests for age  Also reviewed health mt list, fam hx and immunization status , as well as social and family history   See HPI Labs reviewed  amw reviewed  dexa due 12/21-will call for ref (no falls or fx)  Immunized for covid  Recommended shingrix if affordable  Good health habits

## 2020-01-09 NOTE — Assessment & Plan Note (Signed)
Lab Results  Component Value Date   HGBA1C 5.9 01/04/2020   Controlled with diet/exercise disc imp of low glycemic diet and wt loss to prevent DM2

## 2020-03-02 ENCOUNTER — Other Ambulatory Visit: Payer: Self-pay | Admitting: Family Medicine

## 2020-04-18 ENCOUNTER — Other Ambulatory Visit: Payer: Self-pay

## 2020-04-18 ENCOUNTER — Ambulatory Visit (INDEPENDENT_AMBULATORY_CARE_PROVIDER_SITE_OTHER): Payer: Medicare HMO

## 2020-04-18 DIAGNOSIS — Z23 Encounter for immunization: Secondary | ICD-10-CM

## 2020-05-20 ENCOUNTER — Telehealth: Payer: Self-pay | Admitting: Family Medicine

## 2020-05-20 ENCOUNTER — Other Ambulatory Visit: Payer: Self-pay | Admitting: Family Medicine

## 2020-05-20 DIAGNOSIS — E2839 Other primary ovarian failure: Secondary | ICD-10-CM

## 2020-05-20 DIAGNOSIS — Z1231 Encounter for screening mammogram for malignant neoplasm of breast: Secondary | ICD-10-CM

## 2020-05-20 NOTE — Telephone Encounter (Signed)
Left VM letting pt know DEXA order done and she can call and schedule appt

## 2020-05-20 NOTE — Telephone Encounter (Signed)
Pt is requesting an order to be placed for her DEXA scan at the [facility].  Thank you!

## 2020-05-20 NOTE — Telephone Encounter (Signed)
I put in the referral  She can call and make her appt

## 2020-07-01 ENCOUNTER — Other Ambulatory Visit: Payer: Self-pay

## 2020-07-01 ENCOUNTER — Ambulatory Visit
Admission: RE | Admit: 2020-07-01 | Discharge: 2020-07-01 | Disposition: A | Discharge status: Home / Self Care | Payer: Medicare HMO | Source: Ambulatory Visit | Attending: Family Medicine | Admitting: Family Medicine

## 2020-07-01 DIAGNOSIS — Z1231 Encounter for screening mammogram for malignant neoplasm of breast: Secondary | ICD-10-CM

## 2020-09-05 ENCOUNTER — Ambulatory Visit
Admission: RE | Admit: 2020-09-05 | Discharge: 2020-09-05 | Disposition: A | Discharge status: Home / Self Care | Payer: Medicare HMO | Source: Ambulatory Visit | Attending: Family Medicine | Admitting: Family Medicine

## 2020-09-05 ENCOUNTER — Other Ambulatory Visit: Payer: Self-pay

## 2020-09-05 DIAGNOSIS — E2839 Other primary ovarian failure: Secondary | ICD-10-CM

## 2020-09-09 ENCOUNTER — Telehealth: Payer: Self-pay | Admitting: *Deleted

## 2020-09-09 NOTE — Telephone Encounter (Signed)
-----   Message from Abner Greenspan, MD sent at 09/06/2020  9:11 AM EST ----- Bone density is down slightly -still in the osteopenia range  Continue ca and D, also exercise  Re check 2 years  She has been off fosamax for a while- it is an option to go back on it for another 5 years to reduce fracture risk Let me know if she is interested

## 2020-09-09 NOTE — Telephone Encounter (Signed)
Left VM requesting pt to call the office back 

## 2020-09-10 NOTE — Telephone Encounter (Signed)
Addressed through results notes  

## 2020-10-24 ENCOUNTER — Other Ambulatory Visit: Payer: Self-pay | Admitting: Family Medicine

## 2021-01-03 ENCOUNTER — Other Ambulatory Visit: Payer: Self-pay

## 2021-01-03 ENCOUNTER — Ambulatory Visit (INDEPENDENT_AMBULATORY_CARE_PROVIDER_SITE_OTHER): Payer: Medicare HMO

## 2021-01-03 DIAGNOSIS — Z0000 Encounter for general adult medical examination without abnormal findings: Secondary | ICD-10-CM

## 2021-01-03 NOTE — Progress Notes (Signed)
PCP notes:  Health Maintenance: shingrix- due   Abnormal Screenings: none   Patient concerns: none   Nurse concerns: none   Next PCP appt.: 01/10/2021 @ 8:30 am

## 2021-01-03 NOTE — Patient Instructions (Signed)
Brianna Espinoza , Thank you for taking time to come for your Medicare Wellness Visit. I appreciate your ongoing commitment to your health goals. Please review the following plan we discussed and let me know if I can assist you in the future.   Screening recommendations/referrals: Colonoscopy: Up to date, completed 06/22/2011, due 06/2021 Mammogram: Up to date, completed 07/01/2020, due 06/2021 Bone Density: Up to date, completed 09/05/2020, due 09/2022 Recommended yearly ophthalmology/optometry visit for glaucoma screening and checkup Recommended yearly dental visit for hygiene and checkup  Vaccinations: Influenza vaccine: Up to date, completed 04/18/2020, due 03/2021 Pneumococcal vaccine: Completed series Tdap vaccine: allergic Shingles vaccine: due, check with your insurance regarding coverage if interested    Covid-19:Completed series  Advanced directives: Please bring a copy of your POA (Power of Attorney) and/or Living Will to your next appointment.   Conditions/risks identified: hypertension, hyperlipidemia   Next appointment: Follow up in one year for your annual wellness visit    Preventive Care 8 Years and Older, Female Preventive care refers to lifestyle choices and visits with your health care provider that can promote health and wellness. What does preventive care include?  A yearly physical exam. This is also called an annual well check.  Dental exams once or twice a year.  Routine eye exams. Ask your health care provider how often you should have your eyes checked.  Personal lifestyle choices, including:  Daily care of your teeth and gums.  Regular physical activity.  Eating a healthy diet.  Avoiding tobacco and drug use.  Limiting alcohol use.  Practicing safe sex.  Taking low-dose aspirin every day.  Taking vitamin and mineral supplements as recommended by your health care provider. What happens during an annual well check? The services and screenings done by  your health care provider during your annual well check will depend on your age, overall health, lifestyle risk factors, and family history of disease. Counseling  Your health care provider may ask you questions about your:  Alcohol use.  Tobacco use.  Drug use.  Emotional well-being.  Home and relationship well-being.  Sexual activity.  Eating habits.  History of falls.  Memory and ability to understand (cognition).  Work and work Statistician.  Reproductive health. Screening  You may have the following tests or measurements:  Height, weight, and BMI.  Blood pressure.  Lipid and cholesterol levels. These may be checked every 5 years, or more frequently if you are over 35 years old.  Skin check.  Lung cancer screening. You may have this screening every year starting at age 87 if you have a 30-pack-year history of smoking and currently smoke or have quit within the past 15 years.  Fecal occult blood test (FOBT) of the stool. You may have this test every year starting at age 39.  Flexible sigmoidoscopy or colonoscopy. You may have a sigmoidoscopy every 5 years or a colonoscopy every 10 years starting at age 78.  Hepatitis C blood test.  Hepatitis B blood test.  Sexually transmitted disease (STD) testing.  Diabetes screening. This is done by checking your blood sugar (glucose) after you have not eaten for a while (fasting). You may have this done every 1-3 years.  Bone density scan. This is done to screen for osteoporosis. You may have this done starting at age 21.  Mammogram. This may be done every 1-2 years. Talk to your health care provider about how often you should have regular mammograms. Talk with your health care provider about your test results,  treatment options, and if necessary, the need for more tests. Vaccines  Your health care provider may recommend certain vaccines, such as:  Influenza vaccine. This is recommended every year.  Tetanus,  diphtheria, and acellular pertussis (Tdap, Td) vaccine. You may need a Td booster every 10 years.  Zoster vaccine. You may need this after age 45.  Pneumococcal 13-valent conjugate (PCV13) vaccine. One dose is recommended after age 53.  Pneumococcal polysaccharide (PPSV23) vaccine. One dose is recommended after age 80. Talk to your health care provider about which screenings and vaccines you need and how often you need them. This information is not intended to replace advice given to you by your health care provider. Make sure you discuss any questions you have with your health care provider. Document Released: 08/16/2015 Document Revised: 04/08/2016 Document Reviewed: 05/21/2015 Elsevier Interactive Patient Education  2017 [employer] Prevention in the Home Falls can cause injuries. They can happen to people of all ages. There are many things you can do to make your home safe and to help prevent falls. What can I do on the outside of my home?  Regularly fix the edges of walkways and driveways and fix any cracks.  Remove anything that might make you trip as you walk through a door, such as a raised step or threshold.  Trim any bushes or trees on the path to your home.  Use bright outdoor lighting.  Clear any walking paths of anything that might make someone trip, such as rocks or tools.  Regularly check to see if handrails are loose or broken. Make sure that both sides of any steps have handrails.  Any raised decks and porches should have guardrails on the edges.  Have any leaves, snow, or ice cleared regularly.  Use sand or salt on walking paths during winter.  Clean up any spills in your garage right away. This includes oil or grease spills. What can I do in the bathroom?  Use night lights.  Install grab bars by the toilet and in the tub and shower. Do not use towel bars as grab bars.  Use non-skid mats or decals in the tub or shower.  If you need to sit down in  the shower, use a plastic, non-slip stool.  Keep the floor dry. Clean up any water that spills on the floor as soon as it happens.  Remove soap buildup in the tub or shower regularly.  Attach bath mats securely with double-sided non-slip rug tape.  Do not have throw rugs and other things on the floor that can make you trip. What can I do in the bedroom?  Use night lights.  Make sure that you have a light by your bed that is easy to reach.  Do not use any sheets or blankets that are too big for your bed. They should not hang down onto the floor.  Have a firm chair that has side arms. You can use this for support while you get dressed.  Do not have throw rugs and other things on the floor that can make you trip. What can I do in the kitchen?  Clean up any spills right away.  Avoid walking on wet floors.  Keep items that you use a lot in easy-to-reach places.  If you need to reach something above you, use a strong step stool that has a grab bar.  Keep electrical cords out of the way.  Do not use floor polish or wax that makes floors  slippery. If you must use wax, use non-skid floor wax.  Do not have throw rugs and other things on the floor that can make you trip. What can I do with my stairs?  Do not leave any items on the stairs.  Make sure that there are handrails on both sides of the stairs and use them. Fix handrails that are broken or loose. Make sure that handrails are as long as the stairways.  Check any carpeting to make sure that it is firmly attached to the stairs. Fix any carpet that is loose or worn.  Avoid having throw rugs at the top or bottom of the stairs. If you do have throw rugs, attach them to the floor with carpet tape.  Make sure that you have a light switch at the top of the stairs and the bottom of the stairs. If you do not have them, ask someone to add them for you. What else can I do to help prevent falls?  Wear shoes that:  Do not have high  heels.  Have rubber bottoms.  Are comfortable and fit you well.  Are closed at the toe. Do not wear sandals.  If you use a stepladder:  Make sure that it is fully opened. Do not climb a closed stepladder.  Make sure that both sides of the stepladder are locked into place.  Ask someone to hold it for you, if possible.  Clearly mark and make sure that you can see:  Any grab bars or handrails.  First and last steps.  Where the edge of each step is.  Use tools that help you move around (mobility aids) if they are needed. These include:  Canes.  Walkers.  Scooters.  Crutches.  Turn on the lights when you go into a dark area. Replace any light bulbs as soon as they burn out.  Set up your furniture so you have a clear path. Avoid moving your furniture around.  If any of your floors are uneven, fix them.  If there are any pets around you, be aware of where they are.  Review your medicines with your doctor. Some medicines can make you feel dizzy. This can increase your chance of falling. Ask your doctor what other things that you can do to help prevent falls. This information is not intended to replace advice given to you by your health care provider. Make sure you discuss any questions you have with your health care provider. Document Released: 05/16/2009 Document Revised: 12/26/2015 Document Reviewed: 08/24/2014 Elsevier Interactive Patient Education  2017 [employer].

## 2021-01-03 NOTE — Progress Notes (Signed)
Subjective:   Brianna Espinoza is a 70 y.o. female who presents for Medicare Annual (Subsequent) preventive examination.  Review of Systems: N/A      I connected with the patient today by telephone and verified that I am speaking with the correct person using two identifiers. Location patient: home Location nurse: work Persons participating in the telephone visit: patient, nurse.   I discussed the limitations, risks, security and privacy concerns of performing an evaluation and management service by telephone and the availability of in person appointments. I also discussed with the patient that there may be a patient responsible charge related to this service. The patient expressed understanding and verbally consented to this telephonic visit.        Cardiac Risk Factors include: advanced age (>71men, >80 women);hypertension;Other (see comment), Risk factor comments: hyperlipidemia     Objective:    Today's Vitals   There is no height or weight on file to calculate BMI.  Advanced Directives 01/03/2021 01/03/2020 12/02/2018 11/24/2017 11/23/2016 12/17/2014  Does Patient Have a Medical Advance Directive? Yes Yes Yes Yes Yes Yes  Type of Paramedic of [place];Living will [place];Living will [place];Living will [place];Living will Living will;Healthcare Power of [place];Living will  Does patient want to make changes to medical advance directive? - - - No - Patient declined - -  Copy of [place] in Chart? No - copy requested No - copy requested No - copy requested No - copy requested No - copy requested No - copy requested    Current Medications (verified) Outpatient Encounter Medications as of 01/03/2021  Medication Sig  . aspirin EC 325 MG tablet Take 1 tablet (325 mg total) by mouth daily.  . Calcium Carbonate-Vit D-Min 600-400 MG-UNIT TABS Take 2 tablets  by mouth daily.  . fexofenadine (ALLEGRA) 180 MG tablet Take 180 mg by mouth daily as needed (during allergy season).   . fluticasone (FLONASE) 50 MCG/ACT nasal spray USE 2 SPRAYS INTO EACH NOSTRIL ONCE DAILY AS NEEDED ALLERGIES/RHINITIS  . hydrochlorothiazide (HYDRODIURIL) 25 MG tablet TAKE 1 TABLET BY MOUTH ONCE DAILY  . lovastatin (MEVACOR) 20 MG tablet Take 1 tablet (20 mg total) by mouth every other day.  . Multiple Vitamin (MULTIVITAMIN) tablet Take 1 tablet by mouth daily.  Marland Kitchen omeprazole (PRILOSEC) 20 MG capsule Take 1 capsule (20 mg total) by mouth every other day.   No facility-administered encounter medications on file as of 01/03/2021.    Allergies (verified) Buspar [buspirone], Lipitor [atorvastatin], Tetanus toxoid, Wellbutrin [bupropion], Acetaminophen, and Septra [sulfamethoxazole-trimethoprim]   History: Past Medical History:  Diagnosis Date  . Allergy    allergic rhinitis  . Arthritis    OA  . Fibrocystic breast   . GERD (gastroesophageal reflux disease)   . Hyperlipidemia   . Obesity   . Osteopenia   . Stroke [facility])    cerebral infarction  . Tibia fracture 2012   playing golf    Past Surgical History:  Procedure Laterality Date  . BREAST CYST ASPIRATION  1980s   aspirated breast lump  . COLONOSCOPY  2012  . ESOPHAGOGASTRODUODENOSCOPY ENDOSCOPY  09/18/14  . INGUINAL HERNIA REPAIR Right 1956  . TUBAL LIGATION  1976   lap   Family History  Problem Relation Age of Onset  . Arthritis Mother   . Hyperlipidemia Mother   . Hypertension Mother   . COPD Mother   . Asthma Mother   .  Stroke Father   . Heart disease Father   . Pancreatic cancer Maternal Grandfather   . Heart disease Maternal Grandfather   . Arthritis Maternal Grandmother   . Hypertension Maternal Grandmother   . Stroke Maternal Grandmother   . Colon cancer Neg Hx   . Esophageal cancer Neg Hx   . Rectal cancer Neg Hx   . Stomach cancer Neg Hx   . Breast cancer Neg Hx    Social History    Socioeconomic History  . Marital status: Married    Spouse name: Not on file  . Number of children: 1  . Years of education: 55  . Highest education level: Not on file  Occupational History  . Occupation: retired  Tobacco Use  . Smoking status: Never Smoker  . Smokeless tobacco: Never Used  Vaping Use  . Vaping Use: Never used  Substance and Sexual Activity  . Alcohol use: Yes    Alcohol/week: 3.0 standard drinks    Types: 3 Glasses of wine per week    Comment: occ-wine  . Drug use: No  . Sexual activity: Not on file  Other Topics Concern  . Not on file  Social History Narrative   Patient is married with one child.   Patient is left handed.   Patient has 14 yrs of education.   Patient drinks 1 and 1/2 cup daily.   Social Determinants of Health   Financial Resource Strain: Low Risk   . Difficulty of Paying Living Expenses: Not hard at all  Food Insecurity: No Food Insecurity  . Worried About Charity fundraiser in the Last Year: Never true  . Ran Out of Food in the Last Year: Never true  Transportation Needs: No Transportation Needs  . Lack of Transportation (Medical): No  . Lack of Transportation (Non-Medical): No  Physical Activity: Sufficiently Active  . Days of Exercise per Week: 7 days  . Minutes of Exercise per Session: 60 min  Stress: No Stress Concern Present  . Feeling of Stress : Not at all  Social Connections: Not on file    Tobacco Counseling Counseling given: Not Answered   Clinical Intake:  Pre-visit preparation completed: Yes  Pain : No/denies pain     Nutritional Risks: None Diabetes: No  How often do you need to have someone help you when you read instructions, pamphlets, or other written materials from your doctor or pharmacy?: 1 - Never  Diabetic: No Nutrition Risk Assessment:  Has the patient had any N/V/D within the last 2 months?  No  Does the patient have any non-healing wounds?  No  Has the patient had any unintentional  weight loss or weight gain?  No   Diabetes:  Is the patient diabetic?  No  If diabetic, was a CBG obtained today?  N/A Did the patient bring in their glucometer from home?  N/A How often do you monitor your CBG's? N/A.   Financial Strains and Diabetes Management:  Are you having any financial strains with the device, your supplies or your medication? N/A.  Does the patient want to be seen by Chronic Care Management for management of their diabetes?  N/A Would the patient like to be referred to a Nutritionist or for Diabetic Management?  N/A Interpreter Needed?: No  Information entered by :: CJohnson, LPN   Activities of Daily Living In your present state of health, do you have any difficulty performing the following activities: 01/03/2021  Hearing? Y  Comment wears hearing aids  Vision? N  Difficulty concentrating or making decisions? N  Walking or climbing stairs? N  Dressing or bathing? N  Doing errands, shopping? N  Preparing Food and eating ? N  Using the Toilet? N  In the past six months, have you accidently leaked urine? N  Do you have problems with loss of bowel control? N  Managing your Medications? N  Managing your Finances? N  Housekeeping or managing your Housekeeping? N  Some recent data might be hidden    Patient Care Team: [place], Wynelle Fanny, MD as PCP - General Edison Pace, Josie Saunders, MD as Consulting Physician (Ophthalmology) [place] any recent Medical Services you may have received from other than [facility] providers in the past year (date may be approximate).     Assessment:   This is a routine wellness examination for [place].  Hearing/Vision screen  Hearing Screening   125Hz  250Hz  500Hz  1000Hz  2000Hz  3000Hz  4000Hz  6000Hz  8000Hz   Right ear:           Left ear:           Vision Screening Comments: Patient gets annual eye exams   Dietary issues and exercise activities discussed: Current Exercise Habits: Home exercise  routine, Type of exercise: walking;Other - see comments (golf), Time (Minutes): 60, Frequency (Times/Week): 7, Weekly Exercise (Minutes/Week): 420, Intensity: Moderate, Exercise limited by: None identified  Goals Addressed            This Visit's Progress   . Patient Stated       01/03/2021, I will continue to walk daily for about 1 hour and play golf during the week.       Depression Screen PHQ 2/9 Scores 01/03/2021 01/03/2020 12/02/2018 11/24/2017 11/23/2016 11/19/2015 11/13/2013  PHQ - 2 Score 0 0 0 2 2 0 0  PHQ- 9 Score 0 0 0 2 3 - -    Fall Risk Fall Risk  01/03/2021 01/03/2020 12/02/2018 11/24/2017 11/23/2016  Falls in the past year? 0 0 0 Yes No  Comment - - - fell while doing gardening -  Number falls in past yr: 0 0 - 1 -  Injury with Fall? 0 0 - No -  Risk for fall due to : Medication side effect Medication side effect - - -  Follow up Falls evaluation completed;Falls prevention discussed Falls evaluation completed;Falls prevention discussed - - -    FALL RISK PREVENTION PERTAINING TO THE HOME:  Any stairs in or around the home? Yes  If so, are there any without handrails? No  Home free of loose throw rugs in walkways, pet beds, electrical cords, etc? Yes  Adequate lighting in your home to reduce risk of falls? Yes   ASSISTIVE DEVICES UTILIZED TO PREVENT FALLS:  Life alert? No  Use of a cane, walker or w/c? No  Grab bars in the bathroom? No  Shower chair or bench in shower? No  Elevated toilet seat or a handicapped toilet? No   TIMED UP AND GO:  Was the test performed? N/A telephone visit .   Cognitive Function: MMSE - Mini Mental State Exam 01/03/2021 01/03/2020 12/02/2018 11/24/2017 11/23/2016  Not completed: Refused - - - -  Orientation to time - 5 5 5 5   Orientation to Place - 5 5 5 5   Registration - 3 3 3 3   Attention/ Calculation - 5 0 0 0  Recall - 3 3 3 3   Language- name 2 objects - - 0 0 0  Language-  repeat - 1 1 1 1   Language- follow 3 step command - - 0 3 3   Language- read & follow direction - - 0 0 0  Write a sentence - - 0 0 0  Copy design - - 0 0 0  Total score - - 17 20 20   Mini Cog  Mini-Cog screen was not completed. Patient refused. Maximum score is 22. A value of 0 denotes this part of the MMSE was not completed or the patient failed this part of the Mini-Cog screening.       Immunizations Immunization History  Administered Date(s) Administered  . Influenza Split 04/29/2011, 07/20/2012  . Influenza,inj,Quad PF,6+ Mos 05/30/2014, 05/24/2015, 06/02/2016, 06/08/2017, 06/09/2018, 04/25/2019, 04/18/2020  . Influenza-Unspecified 05/25/2013  . PFIZER(Purple Top)SARS-COV-2 Vaccination 09/12/2019, 10/03/2019, 05/15/2020  . Pneumococcal Conjugate-13 11/16/2014  . Pneumococcal Polysaccharide-23 11/19/2015  . Zoster, Live 02/21/2014    TDAP status: allergic  Flu Vaccine status: Up to date  Pneumococcal vaccine status: Up to date  Covid-19 vaccine status: Completed vaccines  Qualifies for Shingles Vaccine? Yes   Zostavax completed Yes   Shingrix Completed?: No.    Education has been provided regarding the importance of this vaccine. Patient has been advised to call insurance company to determine out of pocket expense if they have not yet received this vaccine. Advised may also receive vaccine at local pharmacy or Health Dept. Verbalized acceptance and understanding.  Screening Tests Health Maintenance  Topic Date Due  . Zoster Vaccines- Shingrix (1 of 2) Never done  . INFLUENZA VACCINE  03/03/2021  . COLONOSCOPY (Pts 45-18yrs Insurance coverage will need to be confirmed)  06/21/2021  . TETANUS/TDAP  09/20/2021  . MAMMOGRAM  07/01/2022  . DEXA SCAN  Completed  . COVID-19 Vaccine  Completed  . Hepatitis C Screening  Completed  . PNA vac Low Risk Adult  Completed  . Pneumococcal Vaccine 16-15 Years old  Aged Out  . HPV VACCINES  Aged Out    Health Maintenance  Health Maintenance Due  Topic Date Due  . Zoster Vaccines-  Shingrix (1 of 2) Never done    Colorectal cancer screening: Type of screening: Colonoscopy. Completed 06/22/2011. Repeat every 10 years  Mammogram status: Completed 07/01/2020. Repeat every year  Bone Density status: Completed 09/05/2020. Results reflect: Bone density results: OSTEOPENIA. Repeat every 2 years.  Lung Cancer Screening: (Low Dose CT Chest recommended if Age 80-80 years, 30 pack-year currently smoking OR have quit w/in 15 years.) does not qualify.   Additional Screening:  Hepatitis C Screening: does qualify; Completed 11/23/2016  Vision Screening: Recommended annual ophthalmology exams for early detection of glaucoma and other disorders of the eye. Is the patient up to date with their annual eye exam?  Yes  Who is the provider or what is the name of the office in which the patient attends annual eye exams? Dr. Edison Pace, [facility] If pt is not established with a provider, would they like to be referred to a provider to establish care? No .   Dental Screening: Recommended annual dental exams for proper oral hygiene  Community Resource Referral / Chronic Care Management: CRR required this visit?  No   CCM required this visit?  No      Plan:     I have personally reviewed and noted the following in the patient's chart:   . Medical and social history . Use of alcohol, tobacco or illicit drugs  . Current medications and supplements including opioid prescriptions.  . Functional  ability and status . Nutritional status . Physical activity . Advanced directives . List of other physicians . Hospitalizations, surgeries, and ER visits in previous 12 months . Vitals . Screenings to include cognitive, depression, and falls . Referrals and appointments  In addition, I have reviewed and discussed with patient certain preventive protocols, quality metrics, and best practice recommendations. A written personalized care plan for preventive services as well as general  preventive health recommendations were provided to patient.   Due to this being a telephonic visit, the after visit summary with patients personalized plan was offered to patient via office or my-chart. Patient preferred to pick up at office at next visit or via mychart.   Andrez Grime, LPN   [id number]/07/[id number]

## 2021-01-05 ENCOUNTER — Telehealth: Payer: Self-pay | Admitting: Family Medicine

## 2021-01-05 DIAGNOSIS — E7800 Pure hypercholesterolemia, unspecified: Secondary | ICD-10-CM

## 2021-01-05 DIAGNOSIS — R7303 Prediabetes: Secondary | ICD-10-CM

## 2021-01-05 DIAGNOSIS — I10 Essential (primary) hypertension: Secondary | ICD-10-CM

## 2021-01-05 NOTE — Telephone Encounter (Signed)
-----   Message from Cloyd Stagers, RT sent at 12/23/2020 10:30 AM EDT ----- Regarding: Lab Orders for Monday 6.6.2022 Please place lab orders for Monday 6.6.2022, office visit for physical on Friday 6.10.2022 Thank you, Dyke Maes RT(R)

## 2021-01-06 ENCOUNTER — Other Ambulatory Visit: Payer: Self-pay

## 2021-01-06 ENCOUNTER — Telehealth: Payer: Self-pay | Admitting: Family Medicine

## 2021-01-06 ENCOUNTER — Other Ambulatory Visit (INDEPENDENT_AMBULATORY_CARE_PROVIDER_SITE_OTHER): Payer: Medicare HMO

## 2021-01-06 DIAGNOSIS — I10 Essential (primary) hypertension: Secondary | ICD-10-CM

## 2021-01-06 DIAGNOSIS — E7800 Pure hypercholesterolemia, unspecified: Secondary | ICD-10-CM | POA: Diagnosis not present

## 2021-01-06 DIAGNOSIS — R7303 Prediabetes: Secondary | ICD-10-CM

## 2021-01-06 LAB — CBC WITH DIFFERENTIAL/PLATELET
Basophils Absolute: 0.1 10*3/uL (ref 0.0–0.1)
Basophils Relative: 1.3 % (ref 0.0–3.0)
Eosinophils Absolute: 0.2 10*3/uL (ref 0.0–0.7)
Eosinophils Relative: 2.2 % (ref 0.0–5.0)
HCT: 40.7 % (ref 36.0–46.0)
Hemoglobin: 14 g/dL (ref 12.0–15.0)
Lymphocytes Relative: 28.1 % (ref 12.0–46.0)
Lymphs Abs: 2.4 10*3/uL (ref 0.7–4.0)
MCHC: 34.3 g/dL (ref 30.0–36.0)
MCV: 92.1 fl (ref 78.0–100.0)
Monocytes Absolute: 0.5 10*3/uL (ref 0.1–1.0)
Monocytes Relative: 6 % (ref 3.0–12.0)
Neutro Abs: 5.2 10*3/uL (ref 1.4–7.7)
Neutrophils Relative %: 62.4 % (ref 43.0–77.0)
Platelets: 290 10*3/uL (ref 150.0–400.0)
RBC: 4.42 Mil/uL (ref 3.87–5.11)
RDW: 14 % (ref 11.5–15.5)
WBC: 8.4 10*3/uL (ref 4.0–10.5)

## 2021-01-06 LAB — COMPREHENSIVE METABOLIC PANEL
ALT: 14 U/L (ref 0–35)
AST: 18 U/L (ref 0–37)
Albumin: 4.2 g/dL (ref 3.5–5.2)
Alkaline Phosphatase: 65 U/L (ref 39–117)
BUN: 21 mg/dL (ref 6–23)
CO2: 29 mEq/L (ref 19–32)
Calcium: 9.4 mg/dL (ref 8.4–10.5)
Chloride: 102 mEq/L (ref 96–112)
Creatinine, Ser: 0.94 mg/dL (ref 0.40–1.20)
GFR: 60.71 mL/min (ref >60.00)
Glucose, Bld: 88 mg/dL (ref 70–99)
Potassium: 3.9 mEq/L (ref 3.5–5.1)
Sodium: 140 mEq/L (ref 135–145)
Total Bilirubin: 0.5 mg/dL (ref 0.2–1.2)
Total Protein: 6.7 g/dL (ref 6.0–8.3)

## 2021-01-06 LAB — LIPID PANEL
Cholesterol: 186 mg/dL (ref 0–200)
HDL: 50.4 mg/dL (ref >39.00)
LDL Cholesterol: 120 mg/dL — ABNORMAL HIGH (ref 0–99)
NonHDL: 135.45
Total CHOL/HDL Ratio: 4
Triglycerides: 78 mg/dL (ref 0.0–149.0)
VLDL: 15.6 mg/dL (ref 0.0–40.0)

## 2021-01-06 LAB — HEMOGLOBIN A1C: Hgb A1c MFr Bld: 6 % (ref 4.6–6.5)

## 2021-01-06 LAB — TSH: TSH: 1.81 u[IU]/mL (ref 0.35–4.50)

## 2021-01-06 NOTE — Telephone Encounter (Signed)
In your inbox.

## 2021-01-06 NOTE — Telephone Encounter (Signed)
Pt came into the office to drop off [place] will. Placed in provider folder.

## 2021-01-10 ENCOUNTER — Encounter: Payer: Self-pay | Admitting: Family Medicine

## 2021-01-10 ENCOUNTER — Other Ambulatory Visit: Payer: Self-pay

## 2021-01-10 ENCOUNTER — Ambulatory Visit (INDEPENDENT_AMBULATORY_CARE_PROVIDER_SITE_OTHER): Payer: Medicare HMO | Admitting: Family Medicine

## 2021-01-10 VITALS — BP 122/80 | HR 86 | Temp 96.9°F | Ht 60.5 in | Wt 163.4 lb

## 2021-01-10 DIAGNOSIS — Z0000 Encounter for general adult medical examination without abnormal findings: Secondary | ICD-10-CM | POA: Diagnosis not present

## 2021-01-10 DIAGNOSIS — E6609 Other obesity due to excess calories: Secondary | ICD-10-CM

## 2021-01-10 DIAGNOSIS — K219 Gastro-esophageal reflux disease without esophagitis: Secondary | ICD-10-CM

## 2021-01-10 DIAGNOSIS — R7303 Prediabetes: Secondary | ICD-10-CM

## 2021-01-10 DIAGNOSIS — I10 Essential (primary) hypertension: Secondary | ICD-10-CM | POA: Diagnosis not present

## 2021-01-10 DIAGNOSIS — E7800 Pure hypercholesterolemia, unspecified: Secondary | ICD-10-CM

## 2021-01-10 DIAGNOSIS — M8589 Other specified disorders of bone density and structure, multiple sites: Secondary | ICD-10-CM | POA: Diagnosis not present

## 2021-01-10 DIAGNOSIS — Z6831 Body mass index (BMI) 31.0-31.9, adult: Secondary | ICD-10-CM

## 2021-01-10 MED ORDER — LOVASTATIN 20 MG PO TABS: 20.0000 mg | ORAL_TABLET | ORAL | 3 refills | Status: DC

## 2021-01-10 MED ORDER — OMEPRAZOLE 20 MG PO CPDR: 20.0000 mg | DELAYED_RELEASE_CAPSULE | ORAL | 3 refills | Status: DC

## 2021-01-10 MED ORDER — HYDROCHLOROTHIAZIDE 25 MG PO TABS: 1.0000 | ORAL_TABLET | Freq: Every day | ORAL | 3 refills | Status: DC

## 2021-01-10 NOTE — Patient Instructions (Addendum)
For cholesterol Avoid red meat/ fried foods/ egg yolks/ fatty breakfast meats/ butter, cheese and high fat dairy/ and shellfish    You are due for a colonoscopy in November-this up to you  If you decide against it we can sign you up for the cologuard program  Mammogram is due in November also   Try voltaren gel for hands as needed   Use sun protection

## 2021-01-10 NOTE — Progress Notes (Signed)
Subjective:    Patient ID: Brianna Espinoza, female    DOB: 10-Jan-1951, 70 y.o.   MRN: [id number]  This visit occurred during the SARS-CoV-2 public health emergency.  Safety protocols were in place, including screening questions prior to the visit, additional usage of staff PPE, and extensive cleaning of exam room while observing appropriate contact time as indicated for disinfecting solutions.   HPI Here for health maintenance exam and to review chronic medical problems    Had amw on 6/7  Wt Readings from Last 3 Encounters:  01/10/21 163 lb 6 oz (74.1 kg)  01/09/20 160 lb 9 oz (72.8 kg)  12/07/18 164 lb (74.4 kg)   31.38 kg/m  Gets exercise- plays golf  Gardens/get outdoors  [employer]    Just went to [employer] ok  Had a rough year - her dog had leukemia (was her son's dog)  Had considered some counseling (pet website offers)    Covid immunized with booster  Zostavax2015-interested in shingrix if affordable Flu shot 9/21 Allergic to tetanus shot so cannot get that   Mammogram 11/21 Self breast exam -no lumps   Colonoscopy 11/12-due this nov  Dexa 2/22  Had fosamax in the past  Osteopenia Falls -none Fx-none  Supplements ca and D and mvi Exercise walking/golf/gardening   HTN bp is stable today  No cp or palpitations or headaches or edema  No side effects to medicines  BP Readings from Last 3 Encounters:  01/10/21 122/80  01/09/20 130/78  12/07/18 118/66      Hctz 25 mg daily   GERd Omeprazole 20 mg every other day That handle symptoms   Hyperlipidemia  Lab Results  Component Value Date   CHOL 186 01/06/2021   CHOL 164 01/04/2020   CHOL 168 12/05/2018   Lab Results  Component Value Date   HDL 50.40 01/06/2021   HDL 51.00 01/04/2020   HDL 49.70 12/05/2018   Lab Results  Component Value Date   LDLCALC 120 (H) 01/06/2021   LDLCALC 97 01/04/2020   LDLCALC 103 (H) 12/05/2018   Lab Results  Component Value Date   TRIG 78.0 01/06/2021    TRIG 77.0 01/04/2020   TRIG 81.0 12/05/2018   Lab Results  Component Value Date   CHOLHDL 4 01/06/2021   CHOLHDL 3 01/04/2020   CHOLHDL 3 12/05/2018   No results found for: LDLDIRECT Takes lovastatin 20 mg daily  LDL went up from 97 to 120  Eating less well/comfort food for a while  - back on track now   Prediabetes Lab Results  Component Value Date   HGBA1C 6.0 01/06/2021   Last time 5.9   Lab Results  Component Value Date   WBC 8.4 01/06/2021   HGB 14.0 01/06/2021   HCT 40.7 01/06/2021   MCV 92.1 01/06/2021   PLT 290.0 01/06/2021   Lab Results  Component Value Date   TSH 1.81 01/06/2021    Lab Results  Component Value Date   CREATININE 0.94 01/06/2021   BUN 21 01/06/2021   NA 140 01/06/2021   K 3.9 01/06/2021   CL 102 01/06/2021   CO2 29 01/06/2021   Lab Results  Component Value Date   ALT 14 01/06/2021   AST 18 01/06/2021   ALKPHOS 65 01/06/2021   BILITOT 0.5 01/06/2021    Patient Active Problem List   Diagnosis Date Noted   Grief reaction 12/13/2015   Routine general medical examination at a health care facility 11/19/2015  Estrogen deficiency 04/01/2015   Encounter for Medicare annual wellness exam 11/16/2014   Prediabetes 11/16/2014   History of cerebral infarction 10/03/2014   Essential hypertension 10/03/2014   Obesity 09/21/2011   Osteopenia 03/12/2009   Hyperlipidemia 12/13/2006   ALLERGIC RHINITIS 12/13/2006   GERD 12/13/2006   OSTEOARTHRITIS 12/13/2006   Past Medical History:  Diagnosis Date   Allergy    allergic rhinitis   Arthritis    OA   Fibrocystic breast    GERD (gastroesophageal reflux disease)    Hyperlipidemia    Obesity    Osteopenia    Stroke [facility])    cerebral infarction   Tibia fracture 2012   playing golf    Past Surgical History:  Procedure Laterality Date   BREAST CYST ASPIRATION  1980s   aspirated breast lump   COLONOSCOPY  2012   ESOPHAGOGASTRODUODENOSCOPY ENDOSCOPY  09/18/14   INGUINAL HERNIA  REPAIR Right 1956   TUBAL LIGATION  1976   lap   Social History   Tobacco Use   Smoking status: Never   Smokeless tobacco: Never  Vaping Use   Vaping Use: Never used  Substance Use Topics   Alcohol use: Yes    Alcohol/week: 3.0 standard drinks    Types: 3 Glasses of wine per week    Comment: occ-wine   Drug use: No   Family History  Problem Relation Age of Onset   Arthritis Mother    Hyperlipidemia Mother    Hypertension Mother    COPD Mother    Asthma Mother    Stroke Father    Heart disease Father    Pancreatic cancer Maternal Grandfather    Heart disease Maternal Grandfather    Arthritis Maternal Grandmother    Hypertension Maternal Grandmother    Stroke Maternal Grandmother    Colon cancer Neg Hx    Esophageal cancer Neg Hx    Rectal cancer Neg Hx    Stomach cancer Neg Hx    Breast cancer Neg Hx    Allergies  Allergen Reactions   Buspar [Buspirone]     nausea   Lipitor [Atorvastatin] Other (See Comments)    Muscle pain    Tetanus Toxoid Hives   Wellbutrin [Bupropion] Hives   Acetaminophen Rash   Septra [Sulfamethoxazole-Trimethoprim] Rash    Body rash   Current Outpatient Medications on File Prior to Visit  Medication Sig Dispense Refill   aspirin EC 325 MG tablet Take 1 tablet (325 mg total) by mouth daily. 90 tablet 3   Calcium Carbonate-Vit D-Min 600-400 MG-UNIT TABS Take 2 tablets by mouth daily.     fexofenadine (ALLEGRA) 180 MG tablet Take 180 mg by mouth daily as needed (during allergy season).      fluticasone (FLONASE) 50 MCG/ACT nasal spray USE 2 SPRAYS INTO EACH NOSTRIL ONCE DAILY AS NEEDED ALLERGIES/RHINITIS 48 g 3   Multiple Vitamin (MULTIVITAMIN) tablet Take 1 tablet by mouth daily.     No current facility-administered medications on file prior to visit.    Review of Systems  Constitutional:  Negative for activity change, appetite change, fatigue, fever and unexpected weight change.  HENT:  Negative for congestion, ear pain, rhinorrhea,  sinus pressure and sore throat.   Eyes:  Negative for pain, redness and visual disturbance.  Respiratory:  Negative for cough, shortness of breath and wheezing.   Cardiovascular:  Negative for chest pain and palpitations.  Gastrointestinal:  Negative for abdominal pain, blood in stool, constipation and diarrhea.  Endocrine: Negative for polydipsia  and polyuria.  Genitourinary:  Negative for dysuria, frequency and urgency.  Musculoskeletal:  Positive for arthralgias. Negative for back pain and myalgias.  Skin:  Negative for pallor and rash.  Allergic/Immunologic: Negative for environmental allergies.  Neurological:  Negative for dizziness, syncope and headaches.  Hematological:  Negative for adenopathy. Does not bruise/bleed easily.  Psychiatric/Behavioral:  Negative for decreased concentration and dysphoric mood. The patient is not nervous/anxious.       Objective:   Physical Exam Constitutional:      General: She is not in acute distress.    Appearance: Normal appearance. She is well-developed. She is obese. She is not ill-appearing or diaphoretic.  HENT:     Head: Normocephalic and atraumatic.     Right Ear: Tympanic membrane, ear canal and external ear normal.     Left Ear: Tympanic membrane, ear canal and external ear normal.     Nose: Nose normal. No congestion.     Mouth/Throat:     Mouth: Mucous membranes are moist.     Pharynx: Oropharynx is clear. No posterior oropharyngeal erythema.  Eyes:     General: No scleral icterus.    Extraocular Movements: Extraocular movements intact.     Conjunctiva/sclera: Conjunctivae normal.     Pupils: Pupils are equal, round, and reactive to light.  Neck:     Thyroid: No thyromegaly.     Vascular: No carotid bruit or JVD.  Cardiovascular:     Rate and Rhythm: Normal rate and regular rhythm.     Pulses: Normal pulses.     Heart sounds: Normal heart sounds.    No gallop.  Pulmonary:     Effort: Pulmonary effort is normal. No  respiratory distress.     Breath sounds: Normal breath sounds. No wheezing.     Comments: Good air exch Chest:     Chest wall: No tenderness.  Abdominal:     General: Bowel sounds are normal. There is no distension or abdominal bruit.     Palpations: Abdomen is soft. There is no mass.     Tenderness: There is no abdominal tenderness.     Hernia: No hernia is present.  Genitourinary:    Comments: Breast exam: No mass, nodules, thickening, tenderness, bulging, retraction, inflamation, nipple discharge or skin changes noted.  No axillary or clavicular LA.     Musculoskeletal:        General: No tenderness. Normal range of motion.     Cervical back: Normal range of motion and neck supple. No rigidity. No muscular tenderness.     Right lower leg: No edema.     Left lower leg: No edema.  Lymphadenopathy:     Cervical: No cervical adenopathy.  Skin:    General: Skin is warm and dry.     Coloration: Skin is not pale.     Findings: No erythema or rash.     Comments: Lentigines and SKs diffusely  Neurological:     Mental Status: She is alert. Mental status is at baseline.     Cranial Nerves: No cranial nerve deficit.     Motor: No abnormal muscle tone.     Coordination: Coordination normal.     Gait: Gait normal.     Deep Tendon Reflexes: Reflexes are normal and symmetric. Reflexes normal.  Psychiatric:        Mood and Affect: Mood normal.        Cognition and Memory: Cognition and memory normal.  Assessment & Plan:   Problem List Items Addressed This Visit       Cardiovascular and Mediastinum   Essential hypertension    bp in fair control at this time  BP Readings from Last 1 Encounters:  01/10/21 122/80  No changes needed Most recent labs reviewed  Disc lifstyle change with low sodium diet and exercise  Plans to continue hctz 25 mg daily       Relevant Medications   lovastatin (MEVACOR) 20 MG tablet   hydrochlorothiazide (HYDRODIURIL) 25 MG tablet      Digestive   GERD    Continues omeprazole 20 mg every other day Unable to wean off for now       Relevant Medications   omeprazole (PRILOSEC) 20 MG capsule     Musculoskeletal and Integument   Osteopenia    dexa 2/22 Took course of alendronate in the past No falls or fractures Taking ca and D Walking /golf for exercise with gardening Will check dexa at 1 y         Other   Hyperlipidemia    Disc goals for lipids and reasons to control them Rev last labs with pt Rev low sat fat diet in detail Plan to continue lovastatin 20 mg daily  LDL up to 120 - but getting back on track with diet now        Relevant Medications   lovastatin (MEVACOR) 20 MG tablet   hydrochlorothiazide (HYDRODIURIL) 25 MG tablet   Obesity    Discussed how this problem influences overall health and the risks it imposes  Reviewed plan for weight loss with lower calorie diet (via better food choices and also portion control or program like weight watchers) and exercise building up to or more than 30 minutes 5 days per week including some aerobic activity          Prediabetes    Lab Results  Component Value Date   HGBA1C 6.0 01/06/2021  This is stable disc imp of low glycemic diet and wt loss to prevent DM2        Routine general medical examination at a health care facility - Primary    Reviewed health habits including diet and exercise and skin cancer prevention Reviewed appropriate screening tests for age  Also reviewed health mt list, fam hx and immunization status , as well as social and family history   See HPI Labs reviewed  Due for mammogram and colonoscopy in nov- pt will call for ref if needed  dexa utd /no falls or fx Discussed getting shingrix vaccine  covid immunized wit hbooster

## 2021-01-12 NOTE — Assessment & Plan Note (Signed)
Reviewed health habits including diet and exercise and skin cancer prevention Reviewed appropriate screening tests for age  Also reviewed health mt list, fam hx and immunization status , as well as social and family history   See HPI Labs reviewed  Due for mammogram and colonoscopy in nov- pt will call for ref if needed  dexa utd /no falls or fx Discussed getting shingrix vaccine  covid immunized wit hbooster

## 2021-01-12 NOTE — Assessment & Plan Note (Signed)
bp in fair control at this time  BP Readings from Last 1 Encounters:  01/10/21 122/80   No changes needed Most recent labs reviewed  Disc lifstyle change with low sodium diet and exercise  Plans to continue hctz 25 mg daily

## 2021-01-12 NOTE — Assessment & Plan Note (Signed)
Disc goals for lipids and reasons to control them Rev last labs with pt Rev low sat fat diet in detail Plan to continue lovastatin 20 mg daily  LDL up to 120 - but getting back on track with diet now

## 2021-01-12 NOTE — Assessment & Plan Note (Signed)
dexa 2/22 Took course of alendronate in the past No falls or fractures Taking ca and D Walking /golf for exercise with gardening Will check dexa at 1 y

## 2021-01-12 NOTE — Assessment & Plan Note (Signed)
Lab Results  Component Value Date   HGBA1C 6.0 01/06/2021   This is stable disc imp of low glycemic diet and wt loss to prevent DM2

## 2021-01-12 NOTE — Assessment & Plan Note (Signed)
Discussed how this problem influences overall health and the risks it imposes  Reviewed plan for weight loss with lower calorie diet (via better food choices and also portion control or program like weight watchers) and exercise building up to or more than 30 minutes 5 days per week including some aerobic activity    

## 2021-01-12 NOTE — Assessment & Plan Note (Signed)
Continues omeprazole 20 mg every other day Unable to wean off for now

## 2021-04-22 ENCOUNTER — Ambulatory Visit: Payer: Medicare HMO | Admitting: Family Medicine

## 2021-05-09 IMAGING — MG DIGITAL SCREENING BILAT W/ TOMO W/ CAD
6 of 10 series · 6 of 30 positions shown · non-contrast
Comparison: Previous exam(s).

CLINICAL DATA: Screening.

EXAM:
DIGITAL SCREENING BILATERAL MAMMOGRAM WITH TOMO AND CAD

[R MLO synth-2D (1 of 2)]
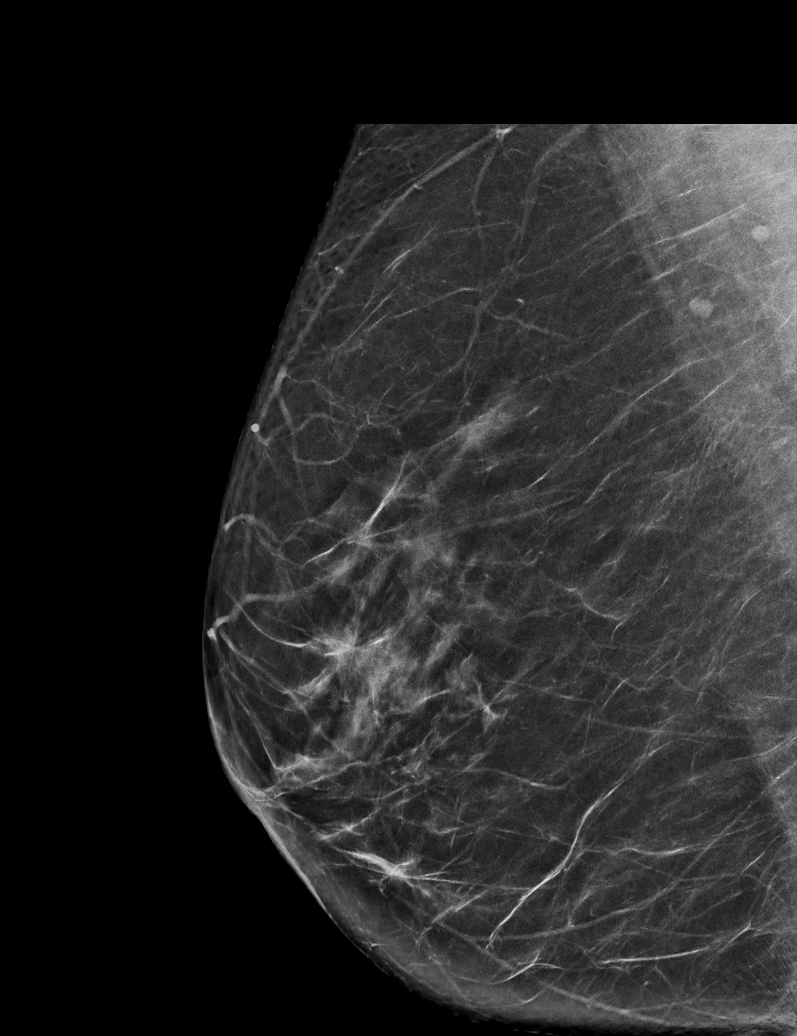

[R MLO synth-2D (2 of 2)]
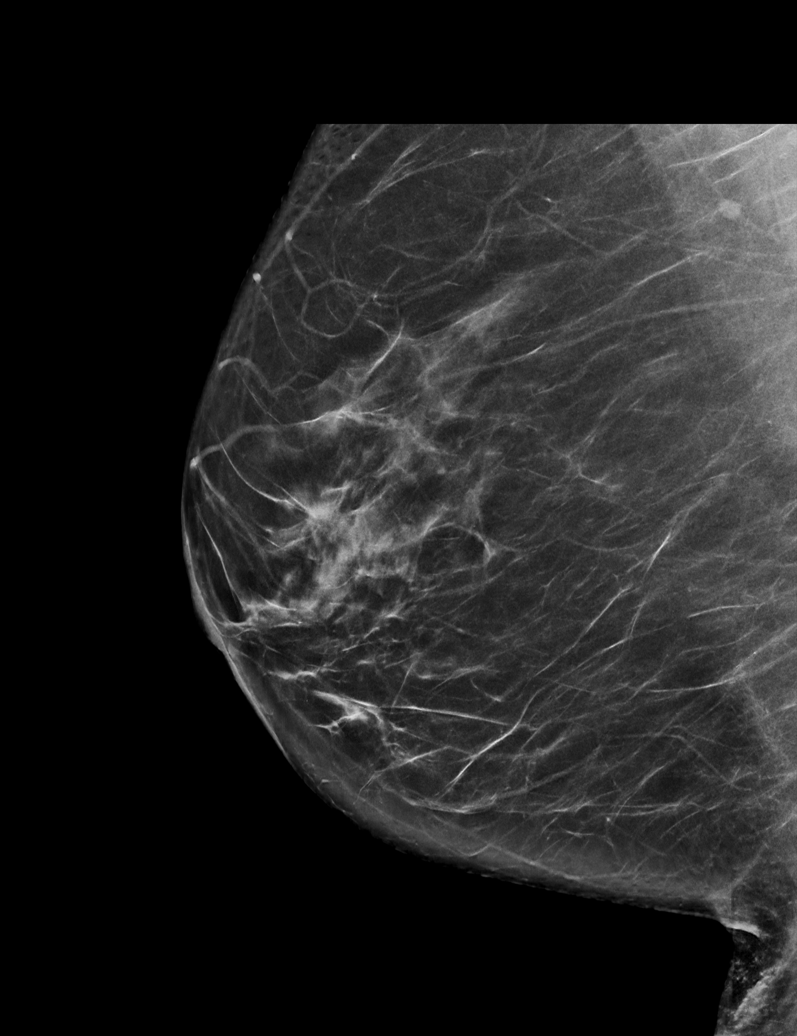

[L MLO synth-2D]
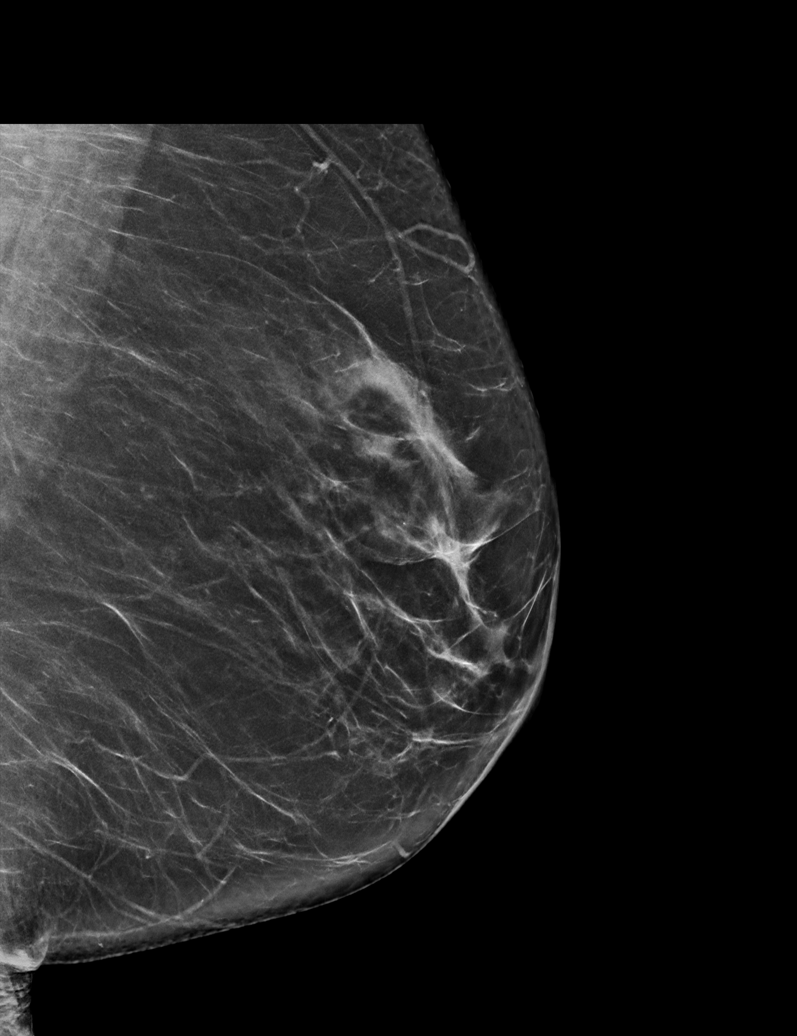

[R CC synth-2D]
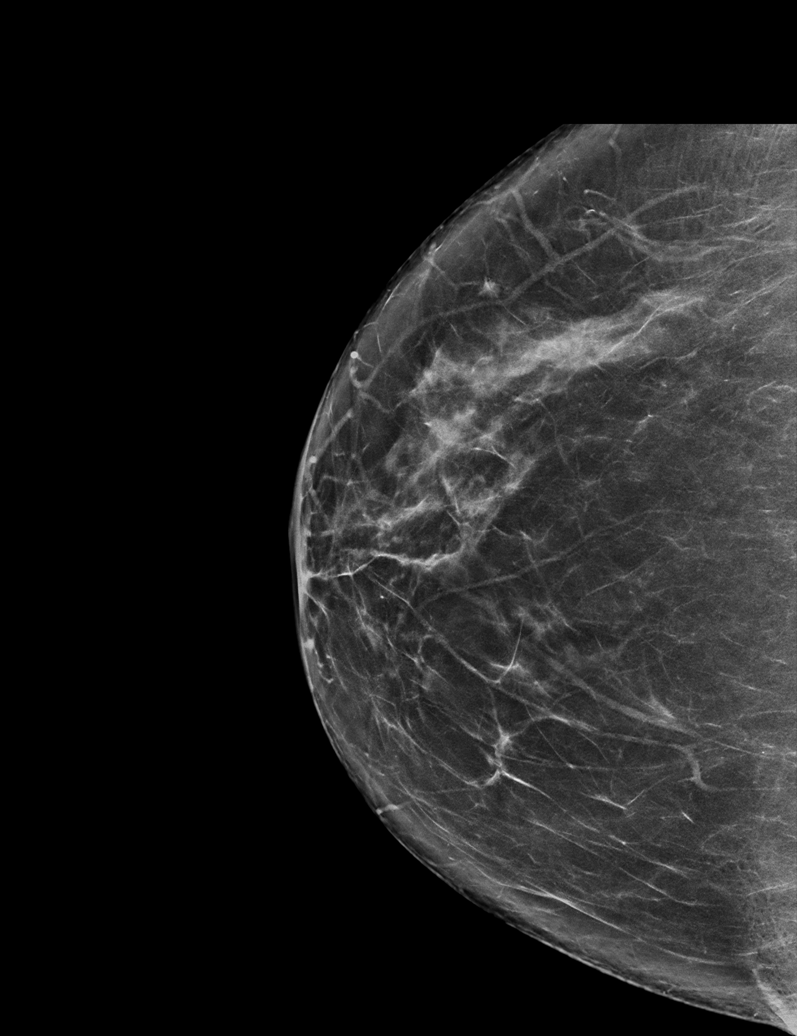

[L CC synth-2D]
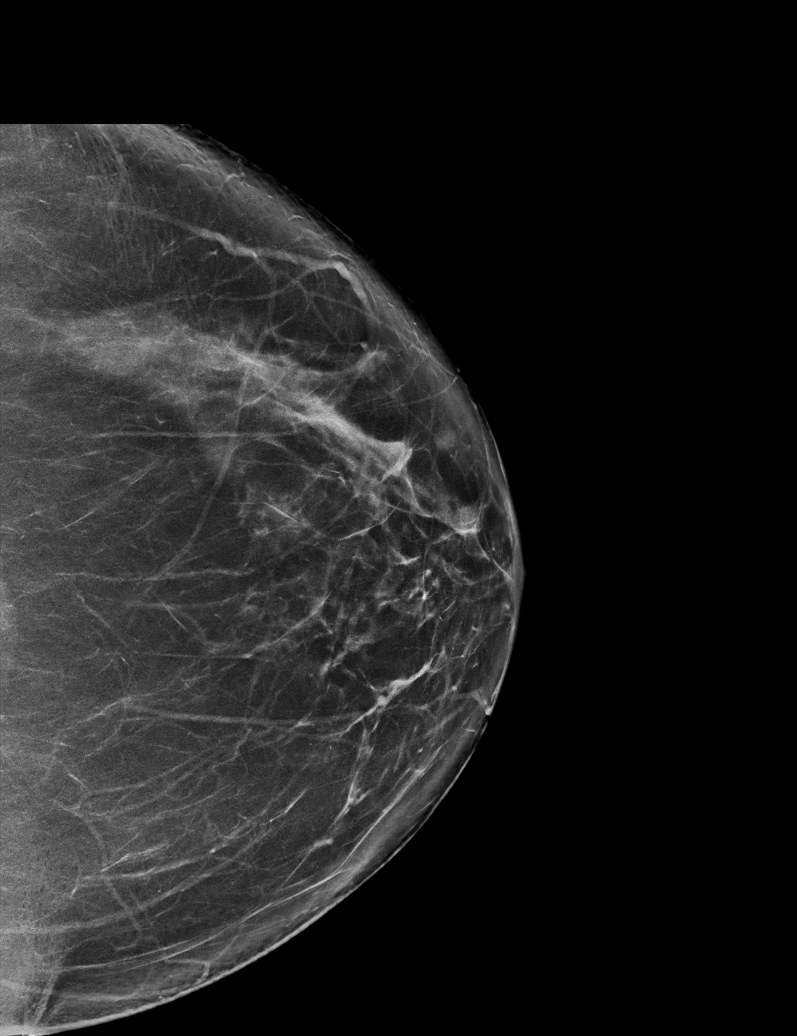

[R MLO tomo · tomo slice 37/72.0]
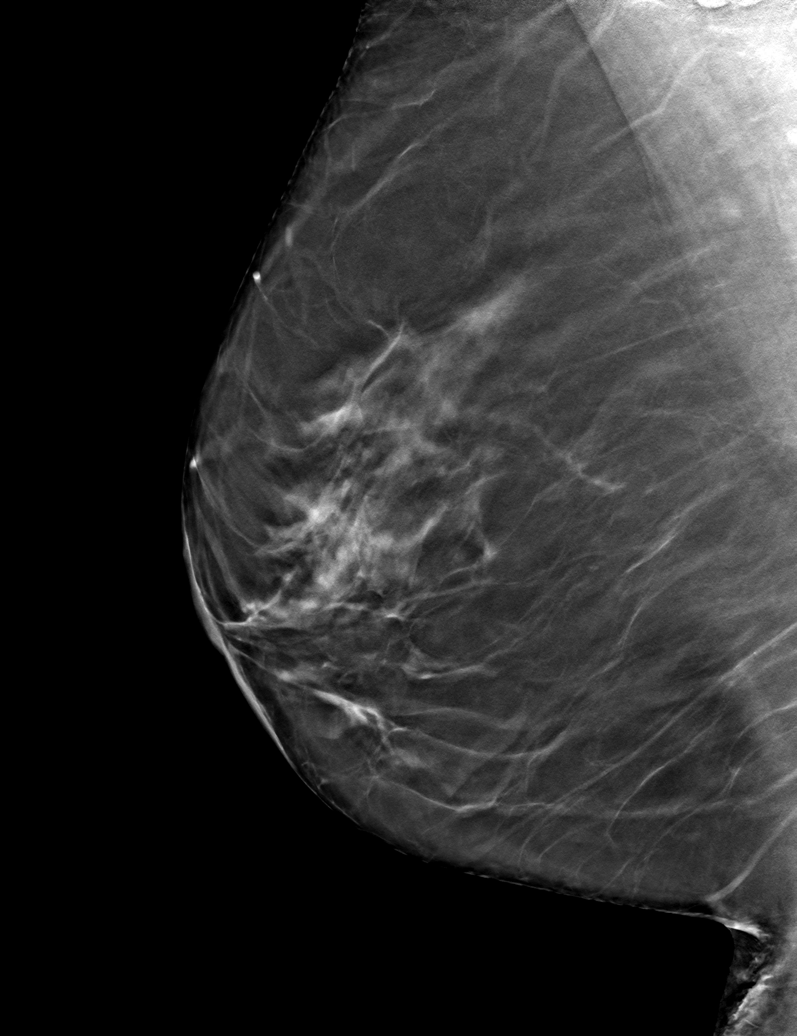

[6 of 30 positions shown; findings below may reference images not displayed]

ACR Breast Density Category c: The breast tissue is heterogeneously
dense, which may obscure small masses.
FINDINGS: There are no findings suspicious for malignancy. Images were
processed with CAD.
IMPRESSION: No mammographic evidence of malignancy. A result letter of this
screening mammogram will be mailed directly to the patient.

RECOMMENDATION:
Screening mammogram in one year. (Code:[id number])

BI-RADS CATEGORY  1: Negative.

## 2021-05-13 ENCOUNTER — Encounter: Payer: Self-pay | Admitting: Gastroenterology

## 2021-05-21 ENCOUNTER — Other Ambulatory Visit: Payer: Self-pay | Admitting: Family Medicine

## 2021-05-21 DIAGNOSIS — Z1231 Encounter for screening mammogram for malignant neoplasm of breast: Secondary | ICD-10-CM

## 2021-06-01 ENCOUNTER — Encounter: Payer: Self-pay | Admitting: Family Medicine

## 2021-06-01 DIAGNOSIS — Z1211 Encounter for screening for malignant neoplasm of colon: Secondary | ICD-10-CM

## 2021-06-02 DIAGNOSIS — Z1211 Encounter for screening for malignant neoplasm of colon: Secondary | ICD-10-CM | POA: Insufficient documentation

## 2021-06-18 LAB — COLOGUARD: COLOGUARD: NEGATIVE

## 2021-07-02 ENCOUNTER — Ambulatory Visit
Admission: RE | Admit: 2021-07-02 | Discharge: 2021-07-02 | Disposition: A | Discharge status: Home / Self Care | Payer: Medicare HMO | Source: Ambulatory Visit

## 2021-07-02 DIAGNOSIS — Z1231 Encounter for screening mammogram for malignant neoplasm of breast: Secondary | ICD-10-CM

## 2021-11-02 ENCOUNTER — Other Ambulatory Visit: Payer: Self-pay | Admitting: Family Medicine

## 2021-11-30 ENCOUNTER — Other Ambulatory Visit: Payer: Self-pay | Admitting: Family Medicine

## 2022-01-05 ENCOUNTER — Ambulatory Visit (INDEPENDENT_AMBULATORY_CARE_PROVIDER_SITE_OTHER): Payer: Medicare HMO

## 2022-01-05 ENCOUNTER — Telehealth: Payer: Self-pay | Admitting: Family Medicine

## 2022-01-05 VITALS — Wt 163.0 lb

## 2022-01-05 DIAGNOSIS — Z0000 Encounter for general adult medical examination without abnormal findings: Secondary | ICD-10-CM

## 2022-01-05 DIAGNOSIS — I10 Essential (primary) hypertension: Secondary | ICD-10-CM

## 2022-01-05 DIAGNOSIS — R7303 Prediabetes: Secondary | ICD-10-CM

## 2022-01-05 DIAGNOSIS — E7800 Pure hypercholesterolemia, unspecified: Secondary | ICD-10-CM

## 2022-01-05 NOTE — Telephone Encounter (Signed)
-----   Message from Ellamae Sia sent at 12/24/2021  2:37 PM EDT ----- Regarding: Lab orders for Tuesday, 6.6.23 Patient is scheduled for CPX labs, please order future labs, Thanks , Karna Christmas

## 2022-01-05 NOTE — Progress Notes (Signed)
Virtual Visit via Telephone Note  I connected with  Brianna Espinoza on 01/05/22 at 11:00 AM EDT by telephone and verified that I am speaking with the correct person using two identifiers.  Location: Patient: [place] Provider: Golden Espinoza participating in the virtual visit: Brianna Espinoza   I discussed the limitations, risks, security and privacy concerns of performing an evaluation and management service by telephone and the availability of in person appointments. The patient expressed understanding and agreed to proceed.  Interactive audio and video telecommunications were attempted between this nurse and patient, however failed, due to patient having technical difficulties OR patient did not have access to video capability.  We continued and completed visit with audio only.  Some vital signs may be absent or patient reported.   Brianna David, LPN  Subjective:   Brianna Espinoza is a 71 y.o. female who presents for Medicare Annual (Subsequent) preventive examination.  Review of Systems           Objective:    There were no vitals filed for this visit. There is no height or weight on file to calculate BMI.     01/03/2021   11:22 AM 01/03/2020   11:19 AM 12/02/2018   11:41 AM 11/24/2017    8:35 AM 11/23/2016    8:36 AM 12/17/2014    8:30 AM  Advanced Directives  Does Patient Have a Medical Advance Directive? Yes Yes Yes Yes Yes Yes  Type of Brianna Espinoza;Living will Brianna Espinoza;Living will Brianna Espinoza;Living will Brianna Espinoza;Living will Living will;Healthcare Power of Brianna Espinoza;Living will  Does patient want to make changes to medical advance directive?    No - Patient declined    Copy of [place] in Chart? No - copy requested No - copy requested No - copy requested No - copy requested No - copy requested No - copy requested     Current Medications (verified) Outpatient Encounter Medications as of 01/05/2022  Medication Sig   aspirin EC 325 MG tablet Take 1 tablet (325 mg total) by mouth daily.   Calcium Carbonate-Vit D-Min 600-400 MG-UNIT TABS Take 2 tablets by mouth daily.   fexofenadine (ALLEGRA) 180 MG tablet Take 180 mg by mouth daily as needed (during allergy season).    fluticasone (FLONASE) 50 MCG/ACT nasal spray USE 2 SPRAYS INTO EACH NOSTRIL ONCE DAILY AS NEEDED ALLERGIES/RHINITIS   hydrochlorothiazide (HYDRODIURIL) 25 MG tablet TAKE 1 TABLET BY MOUTH ONCE DAILY   lovastatin (MEVACOR) 20 MG tablet TAKE 1 TABLET BY MOUTH EVERY OTHER DAY   Multiple Vitamin (MULTIVITAMIN) tablet Take 1 tablet by mouth daily.   omeprazole (PRILOSEC) 20 MG capsule TAKE 1 CAPSULE BY MOUTH EVERY OTHER DAY   No facility-administered encounter medications on file as of 01/05/2022.    Allergies (verified) Buspar [buspirone], Lipitor [atorvastatin], Tetanus toxoid, Wellbutrin [bupropion], Acetaminophen, and Septra [sulfamethoxazole-trimethoprim]   History: Past Medical History:  Diagnosis Date   Allergy    allergic rhinitis   Arthritis    OA   Fibrocystic breast    GERD (gastroesophageal reflux disease)    Hyperlipidemia    Obesity    Osteopenia    Stroke [facility])    cerebral infarction   Tibia fracture 2012   playing golf    Past Surgical History:  Procedure Laterality Date   BREAST CYST ASPIRATION  1980s   aspirated breast lump   COLONOSCOPY  2012   ESOPHAGOGASTRODUODENOSCOPY ENDOSCOPY  09/18/14   INGUINAL HERNIA REPAIR Right 1956   TUBAL LIGATION  1976   lap   Family History  Problem Relation Age of Onset   Arthritis Mother    Hyperlipidemia Mother    Hypertension Mother    COPD Mother    Asthma Mother    Stroke Father    Heart disease Father    Pancreatic cancer Maternal Grandfather    Heart disease Maternal Grandfather    Arthritis Maternal Grandmother    Hypertension Maternal Grandmother    Stroke  Maternal Grandmother    Colon cancer Neg Hx    Esophageal cancer Neg Hx    Rectal cancer Neg Hx    Stomach cancer Neg Hx    Breast cancer Neg Hx    Social History   Socioeconomic History   Marital status: Married    Spouse name: Not on file   Number of children: 1   Years of education: 14   Highest education level: Not on file  Occupational History   Occupation: retired  Tobacco Use   Smoking status: Never   Smokeless tobacco: Never  Vaping Use   Vaping Use: Never used  Substance and Sexual Activity   Alcohol use: Yes    Alcohol/week: 3.0 standard drinks    Types: 3 Glasses of wine per week    Comment: occ-wine   Drug use: No   Sexual activity: Not on file  Other Topics Concern   Not on file  Social History Narrative   Patient is married with one child.   Patient is left handed.   Patient has 14 yrs of education.   Patient drinks 1 and 1/2 cup daily.   Social Determinants of Health   Financial Resource Strain: Low Risk    Difficulty of Paying Living Expenses: Not hard at all  Food Insecurity: No Food Insecurity   Worried About Charity fundraiser in the Last Year: Never true   [place] in the Last Year: Never true  Transportation Needs: No Transportation Needs   Lack of Transportation (Medical): No   Lack of Transportation (Non-Medical): No  Physical Activity: Sufficiently Active   Days of Exercise per Week: 7 days   Minutes of Exercise per Session: 60 min  Stress: No Stress Concern Present   Feeling of Stress : Not at all  Social Connections: Not on file    Tobacco Counseling Counseling given: Not Answered   Clinical Intake:  Pre-visit preparation completed: Yes  Pain : No/denies pain     Diabetes: No  How often do you need to have someone help you when you read instructions, pamphlets, or other written materials from your doctor or pharmacy?: 1 - Never  Diabetic?no  Interpreter Needed?: No  Information entered by :: Brianna Shaggy,  LPN   Activities of Daily Living     View : No data to display.          Patient Care Team: Tower, Wynelle Fanny, MD as PCP - General Edison Pace, Josie Saunders, MD as Consulting Physician (Ophthalmology) [place] any recent Medical Services you may have received from other than [facility] providers in the past year (date may be approximate).     Assessment:   This is a routine wellness examination for [place].  Hearing/Vision screen No results found.  Dietary issues and exercise activities discussed:     Goals Addressed  This Visit's Progress    DIET - EAT MORE FRUITS AND VEGETABLES         Depression Screen    01/05/2022   11:08 AM 01/03/2021   11:24 AM 01/03/2020   11:21 AM 12/02/2018   11:42 AM 11/24/2017    8:56 AM 11/23/2016    8:14 AM 11/19/2015   10:24 AM  PHQ 2/9 Scores  PHQ - 2 Score 0 0 0 0 2 2 0  PHQ- 9 Score 0 0 0 0 2 3     Fall Risk    01/03/2021   11:23 AM 01/03/2020   11:21 AM 12/02/2018   11:42 AM 11/24/2017    8:56 AM 11/23/2016    8:14 AM  Fall Risk   Falls in the past year? 0 0 0 Yes No  Comment    fell while doing gardening   Number falls in past yr: 0 0  1   Injury with Fall? 0 0  No   Risk for fall due to : Medication side effect Medication side effect     Follow up Falls evaluation completed;Falls prevention discussed Falls evaluation completed;Falls prevention discussed       FALL RISK PREVENTION PERTAINING TO THE HOME:  Any stairs in or around the home? No  If so, are there any without handrails? No  Home free of loose throw rugs in walkways, pet beds, electrical cords, etc? Yes  Adequate lighting in your home to reduce risk of falls? Yes   ASSISTIVE DEVICES UTILIZED TO PREVENT FALLS:  Life alert? No  Use of a cane, walker or w/c? No  Grab bars in the bathroom? Yes  Shower chair or bench in shower? Yes  Elevated toilet seat or a handicapped toilet? Yes    Cognitive Function:    01/03/2021   11:37  AM 01/03/2020   11:31 AM 12/02/2018    1:37 PM 11/24/2017    8:34 AM 11/23/2016    8:14 AM  MMSE - Mini Mental State Exam  Not completed: Refused      Orientation to time  '5 5 5 5  '$ Orientation to Place  '5 5 5 5  '$ Registration  '3 3 3 3  '$ Attention/ Calculation  5 0 0 0  Recall  '3 3 3 3  '$ Language- name 2 objects   0 0 0  Language- repeat  '1 1 1 1  '$ Language- follow 3 step command   0 3 3  Language- read & follow direction   0 0 0  Write a sentence   0 0 0  Copy design   0 0 0  Total score   '17 20 20        '$ Immunizations Immunization History  Administered Date(s) Administered   Influenza Split 04/29/2011, 07/20/2012   Influenza,inj,Quad PF,6+ Mos 05/30/2014, 05/24/2015, 06/02/2016, 06/08/2017, 06/09/2018, 04/25/2019, 04/18/2020   Influenza-Unspecified 05/25/2013   PFIZER(Purple Top)SARS-COV-2 Vaccination 09/12/2019, 10/03/2019, 05/15/2020   Pneumococcal Conjugate-13 11/16/2014   Pneumococcal Polysaccharide-23 11/19/2015   Zoster, Live 02/21/2014    TDAP status: Due, Education has been provided regarding the importance of this vaccine. Advised may receive this vaccine at local pharmacy or Health Dept. Aware to provide a copy of the vaccination record if obtained from local pharmacy or Health Dept. Verbalized acceptance and understanding.  Flu Vaccine status: Declined, Education has been provided regarding the importance of this vaccine but patient still declined. Advised may receive this vaccine at local pharmacy or Health Dept. Aware to provide a  copy of the vaccination record if obtained from local pharmacy or Health Dept. Verbalized acceptance and understanding.  Pneumococcal vaccine status: Up to date  Covid-19 vaccine status: Completed vaccines  Qualifies for Shingles Vaccine? Yes   Zostavax completed Yes   Shingrix Completed?: No.    Education has been provided regarding the importance of this vaccine. Patient has been advised to call insurance company to determine out of pocket  expense if they have not yet received this vaccine. Advised may also receive vaccine at local pharmacy or Health Dept. Verbalized acceptance and understanding.  Screening Tests Health Maintenance  Topic Date Due   Zoster Vaccines- Shingrix (1 of 2) Never done   COVID-19 Vaccine (4 - Booster for Pfizer series) 07/10/2020   COLONOSCOPY (Pts 45-57yr Insurance coverage will need to be confirmed)  06/21/2021   TETANUS/TDAP  09/20/2021   INFLUENZA VACCINE  03/03/2022   MAMMOGRAM  07/03/2023   Pneumonia Vaccine 73 Years old  Completed   DEXA SCAN  Completed   Hepatitis C Screening  Completed   HPV VACCINES  Aged Out    Health Maintenance  Health Maintenance Due  Topic Date Due   Zoster Vaccines- Shingrix (1 of 2) Never done   COVID-19 Vaccine (4 - Booster for [place]) 07/10/2020   COLONOSCOPY (Pts 45-452yrInsurance coverage will need to be confirmed)  06/21/2021   TETANUS/TDAP  09/20/2021    Colorectal cancer screening: Type of screening: Colonoscopy. Completed 06/04/11. Repeat every 10 years- Cologuard November 2022  Mammogram status: Completed 07/02/21. Repeat every year  Bone Density status: Completed 09/05/20. Results reflect: Bone density results: NORMAL. Repeat every 5 years.  Lung Cancer Screening: (Low Dose CT Chest recommended if Age 73-80ears, 30 pack-year currently smoking OR have quit w/in 15years.) does not qualify.   Additional Screening:  Hepatitis C Screening: does qualify; Completed 11/23/16  Vision Screening: Recommended annual ophthalmology exams for early detection of glaucoma and other disorders of the eye. Is the patient up to date with their annual eye exam?  Yes  Who is the provider or what is the name of the office in which the patient attends annual eye exams?[facility] provider, would they like to be referred to a provider to establish care? No .   Dental Screening: Recommended annual dental exams for  proper oral hygiene  Community Resource Referral / Chronic Care Management: CRR required this visit?  No   CCM required this visit?  No      Plan:     I have personally reviewed and noted the following in the patient's chart:   Medical and social history Use of alcohol, tobacco or illicit drugs  Current medications and supplements including opioid prescriptions.  Functional ability and status Nutritional status Physical activity Advanced directives List of other physicians Hospitalizations, surgeries, and ER visits in previous 12 months Vitals Screenings to include cognitive, depression, and falls Referrals and appointments  In addition, I have reviewed and discussed with patient certain preventive protocols, quality metrics, and best practice recommendations. A written personalized care plan for preventive services as well as general preventive health recommendations were provided to patient.     LoDionisio DavidLPN   6/03/06/6961 Nurse Notes: none

## 2022-01-05 NOTE — Patient Instructions (Signed)
Brianna Espinoza , Thank you for taking time to come for your Medicare Wellness Visit. I appreciate your ongoing commitment to your health goals. Please review the following plan we discussed and let me know if I can assist you in the future.   Screening recommendations/referrals: Colonoscopy: Cologuard 06/2021 Mammogram: 07/02/21 Bone Density: 09/05/20 Recommended yearly ophthalmology/optometry visit for glaucoma screening and checkup Recommended yearly dental visit for hygiene and checkup  Vaccinations: Influenza vaccine: n/c Pneumococcal vaccine: 11/19/15 Tdap vaccine: n/d Shingles vaccine: Zostavax 02/21/14   Covid-19:09/12/19, 10/03/19, 05/15/20  Advanced directives: yes  Conditions/risks identified: none  Next appointment: Follow up in one year for your annual wellness visit 01/07/23 @ 10:30am by phone   Preventive Care 73 Years and Older, Female Preventive care refers to lifestyle choices and visits with your health care provider that can promote health and wellness. What does preventive care include? A yearly physical exam. This is also called an annual well check. Dental exams once or twice a year. Routine eye exams. Ask your health care provider how often you should have your eyes checked. Personal lifestyle choices, including: Daily care of your teeth and gums. Regular physical activity. Eating a healthy diet. Avoiding tobacco and drug use. Limiting alcohol use. Practicing safe sex. Taking low-dose aspirin every day. Taking vitamin and mineral supplements as recommended by your health care provider. What happens during an annual well check? The services and screenings done by your health care provider during your annual well check will depend on your age, overall health, lifestyle risk factors, and family history of disease. Counseling  Your health care provider may ask you questions about your: Alcohol use. Tobacco use. Drug use. Emotional well-being. Home and relationship  well-being. Sexual activity. Eating habits. History of falls. Memory and ability to understand (cognition). Work and work Statistician. Reproductive health. Screening  You may have the following tests or measurements: Height, weight, and BMI. Blood pressure. Lipid and cholesterol levels. These may be checked every 5 years, or more frequently if you are over 73 years old. Skin check. Lung cancer screening. You may have this screening every year starting at age 51 if you have a 30-pack-year history of smoking and currently smoke or have quit within the past 15 years. Fecal occult blood test (FOBT) of the stool. You may have this test every year starting at age 81. Flexible sigmoidoscopy or colonoscopy. You may have a sigmoidoscopy every 5 years or a colonoscopy every 10 years starting at age 47. Hepatitis C blood test. Hepatitis B blood test. Sexually transmitted disease (STD) testing. Diabetes screening. This is done by checking your blood sugar (glucose) after you have not eaten for a while (fasting). You may have this done every 1-3 years. Bone density scan. This is done to screen for osteoporosis. You may have this done starting at age 73. Mammogram. This may be done every 1-2 years. Talk to your health care provider about how often you should have regular mammograms. Talk with your health care provider about your test results, treatment options, and if necessary, the need for more tests. Vaccines  Your health care provider may recommend certain vaccines, such as: Influenza vaccine. This is recommended every year. Tetanus, diphtheria, and acellular pertussis (Tdap, Td) vaccine. You may need a Td booster every 10 years. Zoster vaccine. You may need this after age 76. Pneumococcal 13-valent conjugate (PCV13) vaccine. One dose is recommended after age 73. Pneumococcal polysaccharide (PPSV23) vaccine. One dose is recommended after age 72. Talk to your health  care provider about which  screenings and vaccines you need and how often you need them. This information is not intended to replace advice given to you by your health care provider. Make sure you discuss any questions you have with your health care provider. Document Released: 08/16/2015 Document Revised: 04/08/2016 Document Reviewed: 05/21/2015 Elsevier Interactive Patient Education  2017 [place] Prevention in the Home Falls can cause injuries. They can happen to people of all ages. There are many things you can do to make your home safe and to help prevent falls. What can I do on the outside of my home? Regularly fix the edges of walkways and driveways and fix any cracks. Remove anything that might make you trip as you walk through a door, such as a raised step or threshold. Trim any bushes or trees on the path to your home. Use bright outdoor lighting. Clear any walking paths of anything that might make someone trip, such as rocks or tools. Regularly check to see if handrails are loose or broken. Make sure that both sides of any steps have handrails. Any raised decks and porches should have guardrails on the edges. Have any leaves, snow, or ice cleared regularly. Use sand or salt on walking paths during winter. Clean up any spills in your garage right away. This includes oil or grease spills. What can I do in the bathroom? Use night lights. Install grab bars by the toilet and in the tub and shower. Do not use towel bars as grab bars. Use non-skid mats or decals in the tub or shower. If you need to sit down in the shower, use a plastic, non-slip stool. Keep the floor dry. Clean up any water that spills on the floor as soon as it happens. Remove soap buildup in the tub or shower regularly. Attach bath mats securely with double-sided non-slip rug tape. Do not have throw rugs and other things on the floor that can make you trip. What can I do in the bedroom? Use night lights. Make sure that you have a  light by your bed that is easy to reach. Do not use any sheets or blankets that are too big for your bed. They should not hang down onto the floor. Have a firm chair that has side arms. You can use this for support while you get dressed. Do not have throw rugs and other things on the floor that can make you trip. What can I do in the kitchen? Clean up any spills right away. Avoid walking on wet floors. Keep items that you use a lot in easy-to-reach places. If you need to reach something above you, use a strong step stool that has a grab bar. Keep electrical cords out of the way. Do not use floor polish or wax that makes floors slippery. If you must use wax, use non-skid floor wax. Do not have throw rugs and other things on the floor that can make you trip. What can I do with my stairs? Do not leave any items on the stairs. Make sure that there are handrails on both sides of the stairs and use them. Fix handrails that are broken or loose. Make sure that handrails are as long as the stairways. Check any carpeting to make sure that it is firmly attached to the stairs. Fix any carpet that is loose or worn. Avoid having throw rugs at the top or bottom of the stairs. If you do have throw rugs, attach them to  the floor with carpet tape. Make sure that you have a light switch at the top of the stairs and the bottom of the stairs. If you do not have them, ask someone to add them for you. What else can I do to help prevent falls? Wear shoes that: Do not have high heels. Have rubber bottoms. Are comfortable and fit you well. Are closed at the toe. Do not wear sandals. If you use a stepladder: Make sure that it is fully opened. Do not climb a closed stepladder. Make sure that both sides of the stepladder are locked into place. Ask someone to hold it for you, if possible. Clearly mark and make sure that you can see: Any grab bars or handrails. First and last steps. Where the edge of each step  is. Use tools that help you move around (mobility aids) if they are needed. These include: Canes. Walkers. Scooters. Crutches. Turn on the lights when you go into a dark area. Replace any light bulbs as soon as they burn out. Set up your furniture so you have a clear path. Avoid moving your furniture around. If any of your floors are uneven, fix them. If there are any pets around you, be aware of where they are. Review your medicines with your doctor. Some medicines can make you feel dizzy. This can increase your chance of falling. Ask your doctor what other things that you can do to help prevent falls. This information is not intended to replace advice given to you by your health care provider. Make sure you discuss any questions you have with your health care provider. Document Released: 05/16/2009 Document Revised: 12/26/2015 Document Reviewed: 08/24/2014 Elsevier Interactive Patient Education  2017 [employer].

## 2022-01-06 ENCOUNTER — Other Ambulatory Visit (INDEPENDENT_AMBULATORY_CARE_PROVIDER_SITE_OTHER): Payer: Medicare HMO

## 2022-01-06 DIAGNOSIS — I10 Essential (primary) hypertension: Secondary | ICD-10-CM

## 2022-01-06 DIAGNOSIS — E7800 Pure hypercholesterolemia, unspecified: Secondary | ICD-10-CM

## 2022-01-06 DIAGNOSIS — R7303 Prediabetes: Secondary | ICD-10-CM | POA: Diagnosis not present

## 2022-01-06 LAB — COMPREHENSIVE METABOLIC PANEL
ALT: 13 U/L (ref 0–35)
AST: 18 U/L (ref 0–37)
Albumin: 4 g/dL (ref 3.5–5.2)
Alkaline Phosphatase: 67 U/L (ref 39–117)
BUN: 20 mg/dL (ref 6–23)
CO2: 31 mEq/L (ref 19–32)
Calcium: 9.5 mg/dL (ref 8.4–10.5)
Chloride: 100 mEq/L (ref 96–112)
Creatinine, Ser: 0.84 mg/dL (ref 0.40–1.20)
GFR: 69 mL/min (ref >60.00)
Glucose, Bld: 91 mg/dL (ref 70–99)
Potassium: 3.3 mEq/L — ABNORMAL LOW (ref 3.5–5.1)
Sodium: 140 mEq/L (ref 135–145)
Total Bilirubin: 0.5 mg/dL (ref 0.2–1.2)
Total Protein: 6.6 g/dL (ref 6.0–8.3)

## 2022-01-06 LAB — CBC WITH DIFFERENTIAL/PLATELET
Basophils Absolute: 0.1 10*3/uL (ref 0.0–0.1)
Basophils Relative: 1 % (ref 0.0–3.0)
Eosinophils Absolute: 0.2 10*3/uL (ref 0.0–0.7)
Eosinophils Relative: 2.4 % (ref 0.0–5.0)
HCT: 39.8 % (ref 36.0–46.0)
Hemoglobin: 13.6 g/dL (ref 12.0–15.0)
Lymphocytes Relative: 28.5 % (ref 12.0–46.0)
Lymphs Abs: 2.1 10*3/uL (ref 0.7–4.0)
MCHC: 34.1 g/dL (ref 30.0–36.0)
MCV: 91 fl (ref 78.0–100.0)
Monocytes Absolute: 0.6 10*3/uL (ref 0.1–1.0)
Monocytes Relative: 7.7 % (ref 3.0–12.0)
Neutro Abs: 4.5 10*3/uL (ref 1.4–7.7)
Neutrophils Relative %: 60.4 % (ref 43.0–77.0)
Platelets: 257 10*3/uL (ref 150.0–400.0)
RBC: 4.37 Mil/uL (ref 3.87–5.11)
RDW: 13.7 % (ref 11.5–15.5)
WBC: 7.4 10*3/uL (ref 4.0–10.5)

## 2022-01-06 LAB — TSH: TSH: 1.68 u[IU]/mL (ref 0.35–5.50)

## 2022-01-06 LAB — LIPID PANEL
Cholesterol: 156 mg/dL (ref 0–200)
HDL: 48.8 mg/dL (ref >39.00)
LDL Cholesterol: 96 mg/dL (ref 0–99)
NonHDL: 107.38
Total CHOL/HDL Ratio: 3
Triglycerides: 58 mg/dL (ref 0.0–149.0)
VLDL: 11.6 mg/dL (ref 0.0–40.0)

## 2022-01-06 LAB — HEMOGLOBIN A1C: Hgb A1c MFr Bld: 6 % (ref 4.6–6.5)

## 2022-01-13 ENCOUNTER — Ambulatory Visit (INDEPENDENT_AMBULATORY_CARE_PROVIDER_SITE_OTHER): Payer: Medicare HMO | Admitting: Family Medicine

## 2022-01-13 ENCOUNTER — Encounter: Payer: Self-pay | Admitting: Family Medicine

## 2022-01-13 VITALS — BP 124/72 | HR 73 | Temp 98.0°F | Resp 16 | Ht 60.0 in | Wt 162.0 lb

## 2022-01-13 DIAGNOSIS — Z0000 Encounter for general adult medical examination without abnormal findings: Secondary | ICD-10-CM

## 2022-01-13 DIAGNOSIS — K219 Gastro-esophageal reflux disease without esophagitis: Secondary | ICD-10-CM

## 2022-01-13 DIAGNOSIS — M8589 Other specified disorders of bone density and structure, multiple sites: Secondary | ICD-10-CM | POA: Diagnosis not present

## 2022-01-13 DIAGNOSIS — E7800 Pure hypercholesterolemia, unspecified: Secondary | ICD-10-CM

## 2022-01-13 DIAGNOSIS — E876 Hypokalemia: Secondary | ICD-10-CM

## 2022-01-13 DIAGNOSIS — I10 Essential (primary) hypertension: Secondary | ICD-10-CM | POA: Diagnosis not present

## 2022-01-13 DIAGNOSIS — Z1211 Encounter for screening for malignant neoplasm of colon: Secondary | ICD-10-CM

## 2022-01-13 DIAGNOSIS — R7303 Prediabetes: Secondary | ICD-10-CM

## 2022-01-13 NOTE — Progress Notes (Signed)
Subjective:    Patient ID: Brianna Espinoza, female    DOB: April 26, 1951, 71 y.o.   MRN: [id number]  HPI Here for health maintenance exam and to review chronic medical problems    Wt Readings from Last 3 Encounters:  01/13/22 162 lb (73.5 kg)  01/05/22 163 lb (73.9 kg)  01/10/21 163 lb 6 oz (74.1 kg)   31.64 kg/m  Doing well this year  Working on yard [place]   Had amw 6/5 Reviewed   Immunization History  Administered Date(s) Administered   Influenza Split 04/29/2011, 07/20/2012   Influenza,inj,Quad PF,6+ Mos 05/30/2014, 05/24/2015, 06/02/2016, 06/08/2017, 06/09/2018, 04/25/2019, 04/18/2020   Influenza-Unspecified 05/25/2013   PFIZER(Purple Top)SARS-COV-2 Vaccination 09/12/2019, 10/03/2019, 05/15/2020   Pneumococcal Conjugate-13 11/16/2014   Pneumococcal Polysaccharide-23 11/19/2015   Zoster, Live 02/21/2014    Shingrix : unsure if she wants  Had the zostavax   Mammogram 06/2021 Self breast exam : no lumps   Colonoscopy 06/2011 and due  Cologuard neg 06/2021 -good for 3 years    Dexa 09/2020 osteopenia/mildly worse 5 y fosamax in past  Falls: none Fractures : none Supplements : ca and D  Exercise : walking  [place]/ heavy yard work   Arthritis in hands - bothersome  Uses a thumb brace as needed  Tries not to take medicine Is allergic to tylenol in the past - rash  Uses voltaren gel on hands CBD on her ankle   Occ oral ibuprofen 200 mg - very seldom    HTN bp is stable today  No cp or palpitations or headaches or edema  No side effects to medicines  BP Readings from Last 3 Encounters:  01/13/22 124/72  01/10/21 122/80  01/09/20 130/78    Hctz 25 mg daily   Pulse Readings from Last 3 Encounters:  01/13/22 73  01/10/21 86  01/09/20 77    GERD Omeprazole  20 mg every other day Using align probiotic -helps with gas    Hyperlipidemia Lab Results  Component Value Date   CHOL 156 01/06/2022   CHOL 186 01/06/2021    CHOL 164 01/04/2020   Lab Results  Component Value Date   HDL 48.80 01/06/2022   HDL 50.40 01/06/2021   HDL 51.00 01/04/2020   Lab Results  Component Value Date   LDLCALC 96 01/06/2022   LDLCALC 120 (H) 01/06/2021   LDLCALC 97 01/04/2020   Lab Results  Component Value Date   TRIG 58.0 01/06/2022   TRIG 78.0 01/06/2021   TRIG 77.0 01/04/2020   Lab Results  Component Value Date   CHOLHDL 3 01/06/2022   CHOLHDL 4 01/06/2021   CHOLHDL 3 01/04/2020   No results found for: "LDLDIRECT"  Lovastatin 20 mg daily  Improved, worked on diet   Prediabetes Lab Results  Component Value Date   HGBA1C 6.0 01/06/2022   Lab Results  Component Value Date   CREATININE 0.84 01/06/2022   BUN 20 01/06/2022   NA 140 01/06/2022   K 3.3 (L) 01/06/2022   CL 100 01/06/2022   CO2 31 01/06/2022   Lab Results  Component Value Date   ALT 13 01/06/2022   AST 18 01/06/2022   ALKPHOS 67 01/06/2022   BILITOT 0.5 01/06/2022   Lab Results  Component Value Date   WBC 7.4 01/06/2022   HGB 13.6 01/06/2022   HCT 39.8 01/06/2022   MCV 91.0 01/06/2022   PLT 257.0 01/06/2022    Patient Active Problem List   Diagnosis Date  Noted   Hypokalemia 01/13/2022   Colon cancer screening 06/02/2021   Grief reaction 12/13/2015   Routine general medical examination at a health care facility 11/19/2015   Estrogen deficiency 04/01/2015   Encounter for Medicare annual wellness exam 11/16/2014   Prediabetes 11/16/2014   History of cerebral infarction 10/03/2014   Essential hypertension 10/03/2014   Obesity 09/21/2011   Osteopenia 03/12/2009   Hyperlipidemia 12/13/2006   ALLERGIC RHINITIS 12/13/2006   GERD 12/13/2006   OSTEOARTHRITIS 12/13/2006   Past Medical History:  Diagnosis Date   Allergy    allergic rhinitis   Arthritis    OA   Fibrocystic breast    GERD (gastroesophageal reflux disease)    Hyperlipidemia    Obesity    Osteopenia    Stroke [facility])    cerebral infarction   [place]  fracture 2012   playing golf    Past Surgical History:  Procedure Laterality Date   BREAST CYST ASPIRATION  1980s   aspirated breast lump   COLONOSCOPY  2012   ESOPHAGOGASTRODUODENOSCOPY ENDOSCOPY  09/18/14   INGUINAL HERNIA REPAIR Right 1956   TUBAL LIGATION  1976   lap   Social History   Tobacco Use   Smoking status: Never   Smokeless tobacco: Never  Vaping Use   Vaping Use: Never used  Substance Use Topics   Alcohol use: Yes    Alcohol/week: 3.0 standard drinks of alcohol    Types: 3 Glasses of wine per week    Comment: occ-wine   Drug use: No   Family History  Problem Relation Age of Onset   Arthritis Mother    Hyperlipidemia Mother    Hypertension Mother    COPD Mother    Asthma Mother    Stroke Father    Heart disease Father    Pancreatic cancer Maternal Grandfather    Heart disease Maternal Grandfather    Arthritis Maternal Grandmother    Hypertension Maternal Grandmother    Stroke Maternal Grandmother    Colon cancer Neg Hx    Esophageal cancer Neg Hx    Rectal cancer Neg Hx    Stomach cancer Neg Hx    Breast cancer Neg Hx    Allergies  Allergen Reactions   Buspar [Buspirone]     nausea   Lipitor [Atorvastatin] Other (See Comments)    Muscle pain    Tetanus Toxoid Hives   Wellbutrin [Bupropion] Hives   Acetaminophen Rash   Septra [Sulfamethoxazole-Trimethoprim] Rash    Body rash   Current Outpatient Medications on File Prior to Visit  Medication Sig Dispense Refill   Probiotic Product (ALIGN) 4 MG CAPS Take by mouth.     aspirin EC 325 MG tablet Take 1 tablet (325 mg total) by mouth daily. 90 tablet 3   Calcium Carbonate-Vit D-Min 600-400 MG-UNIT TABS Take 2 tablets by mouth daily.     fexofenadine (ALLEGRA) 180 MG tablet Take 180 mg by mouth daily as needed (during allergy season).      fluticasone (FLONASE) 50 MCG/ACT nasal spray USE 2 SPRAYS INTO EACH NOSTRIL ONCE DAILY AS NEEDED ALLERGIES/RHINITIS 48 g 3   hydrochlorothiazide (HYDRODIURIL)  25 MG tablet TAKE 1 TABLET BY MOUTH ONCE DAILY 90 tablet 3   lovastatin (MEVACOR) 20 MG tablet TAKE 1 TABLET BY MOUTH EVERY OTHER DAY 45 tablet 3   Multiple Vitamin (MULTIVITAMIN) tablet Take 1 tablet by mouth daily.     omeprazole (PRILOSEC) 20 MG capsule TAKE 1 CAPSULE BY MOUTH EVERY OTHER DAY 45  capsule 3   No current facility-administered medications on file prior to visit.    Review of Systems  Constitutional:  Negative for activity change, appetite change, fatigue, fever and unexpected weight change.  HENT:  Negative for congestion, ear pain, rhinorrhea, sinus pressure and sore throat.   Eyes:  Negative for pain, redness and visual disturbance.  Respiratory:  Negative for cough, shortness of breath and wheezing.   Cardiovascular:  Negative for chest pain and palpitations.  Gastrointestinal:  Negative for abdominal pain, blood in stool, constipation and diarrhea.  Endocrine: Negative for polydipsia and polyuria.  Genitourinary:  Negative for dysuria, frequency and urgency.  Musculoskeletal:  Positive for arthralgias. Negative for back pain and myalgias.  Skin:  Negative for pallor and rash.  Allergic/Immunologic: Negative for environmental allergies.  Neurological:  Negative for dizziness, syncope and headaches.  Hematological:  Negative for adenopathy. Does not bruise/bleed easily.  Psychiatric/Behavioral:  Negative for decreased concentration and dysphoric mood. The patient is not nervous/anxious.        Objective:   Physical Exam Constitutional:      General: She is not in acute distress.    Appearance: Normal appearance. She is well-developed. She is obese. She is not ill-appearing or diaphoretic.  HENT:     Head: Normocephalic and atraumatic.     Right Ear: Tympanic membrane, ear canal and external ear normal.     Left Ear: Tympanic membrane, ear canal and external ear normal.     Nose: Nose normal. No congestion.     Mouth/Throat:     Mouth: Mucous membranes are moist.      Pharynx: Oropharynx is clear. No posterior oropharyngeal erythema.  Eyes:     General: No scleral icterus.    Extraocular Movements: Extraocular movements intact.     Conjunctiva/sclera: Conjunctivae normal.     Pupils: Pupils are equal, round, and reactive to light.  Neck:     Thyroid: No thyromegaly.     Vascular: No carotid bruit or JVD.  Cardiovascular:     Rate and Rhythm: Normal rate and regular rhythm.     Pulses: Normal pulses.     Heart sounds: Normal heart sounds.     No gallop.  Pulmonary:     Effort: Pulmonary effort is normal. No respiratory distress.     Breath sounds: Normal breath sounds. No wheezing.     Comments: Good air exch Chest:     Chest wall: No tenderness.  Abdominal:     General: Bowel sounds are normal. There is no distension or abdominal bruit.     Palpations: Abdomen is soft. There is no mass.     Tenderness: There is no abdominal tenderness.     Hernia: No hernia is present.  Genitourinary:    Comments: Breast exam: No mass, nodules, thickening, tenderness, bulging, retraction, inflamation, nipple discharge or skin changes noted.  No axillary or clavicular LA.     Musculoskeletal:        General: No tenderness. Normal range of motion.     Cervical back: Normal range of motion and neck supple. No rigidity. No muscular tenderness.     Right lower leg: No edema.     Left lower leg: No edema.     Comments: No kyphosis   Lymphadenopathy:     Cervical: No cervical adenopathy.  Skin:    General: Skin is warm and dry.     Coloration: Skin is not pale.     Findings: No erythema or rash.  Comments: Solar lentigines diffusely   Neurological:     Mental Status: She is alert. Mental status is at baseline.     Cranial Nerves: No cranial nerve deficit.     Motor: No abnormal muscle tone.     Coordination: Coordination normal.     Gait: Gait normal.     Deep Tendon Reflexes: Reflexes are normal and symmetric. Reflexes normal.  Psychiatric:         Mood and Affect: Mood normal.        Cognition and Memory: Cognition and memory normal.     Comments: Very sharp           Assessment & Plan:   Problem List Items Addressed This Visit       Cardiovascular and Mediastinum   Essential hypertension    bp in fair control at this time  BP Readings from Last 1 Encounters:  01/13/22 124/72  No changes needed Most recent labs reviewed  Disc lifstyle change with low sodium diet and exercise  hctz 25 mg daily         Digestive   GERD    Omeprazole 20 mg every other day  Watching diet       Relevant Medications   Probiotic Product (ALIGN) 4 MG CAPS     Musculoskeletal and Integument   Osteopenia    dexa 09/2020 5 years of fosamax in past/hesitant for another course  On ca/D and exercises  No falls or fx        Other   Colon cancer screening    cologuard neg 06/2021       Hyperlipidemia    Disc goals for lipids and reasons to control them Rev last labs with pt Rev low sat fat diet in detail Improved Plan to continue lovastatin 20 mg daily and good diet       Hypokalemia    Poss due to hctz Would like to try and inc K in diet  Re check 3 mo  If not improved consider px K       Prediabetes    Stable Lab Results  Component Value Date   HGBA1C 6.0 01/06/2022   disc imp of low glycemic diet and wt loss to prevent DM2       Routine general medical examination at a health care facility - Primary    Reviewed health habits including diet and exercise and skin cancer prevention Reviewed appropriate screening tests for age  Also reviewed health mt list, fam hx and immunization status , as well as social and family history   See HPI Labs reviewed  Mammogram utd cologuard utd  dexa utd Declines shingrix for now/disc pros and cons

## 2022-01-13 NOTE — Assessment & Plan Note (Signed)
cologuard neg 06/2021

## 2022-01-13 NOTE — Assessment & Plan Note (Signed)
Poss due to hctz Would like to try and inc K in diet  Re check 3 mo  If not improved consider px K

## 2022-01-13 NOTE — Assessment & Plan Note (Signed)
Reviewed health habits including diet and exercise and skin cancer prevention Reviewed appropriate screening tests for age  Also reviewed health mt list, fam hx and immunization status , as well as social and family history   See HPI Labs reviewed  Mammogram utd cologuard utd  dexa utd Declines shingrix for now/disc pros and cons

## 2022-01-13 NOTE — Patient Instructions (Addendum)
If you are interested in the new shingles vaccine (Shingrix) - call your local pharmacy to check on coverage and availability  If affordable, get on a wait list at your pharmacy to get the vaccine.    For hands  Voltaren gel Try the CBD topical also   Your potassium is down a little  This may be from your hctz  High potassium foods like leafy greens and bananas and citrus are good  You can also take a multi vitamin daily   Schedule labs in 3 months for potassium

## 2022-01-13 NOTE — Assessment & Plan Note (Signed)
Disc goals for lipids and reasons to control them Rev last labs with pt Rev low sat fat diet in detail Improved Plan to continue lovastatin 20 mg daily and good diet

## 2022-01-13 NOTE — Assessment & Plan Note (Signed)
Omeprazole 20 mg every other day  Watching diet

## 2022-01-13 NOTE — Assessment & Plan Note (Signed)
bp in fair control at this time  BP Readings from Last 1 Encounters:  01/13/22 124/72   No changes needed Most recent labs reviewed  Disc lifstyle change with low sodium diet and exercise  hctz 25 mg daily

## 2022-01-13 NOTE — Assessment & Plan Note (Signed)
dexa 09/2020 5 years of fosamax in past/hesitant for another course  On ca/D and exercises  No falls or fx

## 2022-01-13 NOTE — Assessment & Plan Note (Signed)
Stable Lab Results  Component Value Date   HGBA1C 6.0 01/06/2022   disc imp of low glycemic diet and wt loss to prevent DM2

## 2022-03-06 ENCOUNTER — Telehealth: Payer: Self-pay | Admitting: Family Medicine

## 2022-03-06 NOTE — Telephone Encounter (Signed)
[facility] - Client TELEPHONE ADVICE RECORD [facility] Patient Name: Brianna Espinoza Gender: Female DOB: 03/16/1951 Age: 71 Y 9 M 15 D Return Phone Number: [phone] (Primary), [phone] (Secondary) Address: City/ State/ Zip: [place] [address] Client [facility] - Client Client Site [facility] Provider Glori Bickers, Roque Lias - MD Contact Type Call Who Is Calling Patient / Member / Family / Caregiver Call Type Triage / Clinical Relationship To Patient Self Return Phone Number [phone] (Primary) Chief Complaint Abdominal Pain Reason for Call Symptomatic / Request for [place] states has pain on the right side of her abdomen, coffee aggravates the pain. Translation No Nurse Assessment Nurse: Hardin Negus, RN, Mardene Celeste Date/Time Eilene Ghazi Time): 03/06/2022 1:45:55 PM Confirm and document reason for call. If symptomatic, describe symptoms. ---She has been having abdomen pain for about 6 weeks. The pain is worse when she drinks coffee. When she has the pain it is not severe. Does the patient have any new or worsening symptoms? ---Yes Will a triage be completed? ---Yes Related visit to physician within the last 2 weeks? ---No Does the PT have any chronic conditions? (i.e. diabetes, asthma, this includes High risk factors for pregnancy, etc.) ---Yes List chronic conditions. ---reflux, high cholesterol. ASA every day Is this a behavioral health or substance abuse call? ---No Guidelines Guideline Title Affirmed Question Affirmed Notes Nurse Date/Time Eilene Ghazi Time) Abdominal Pain - Female [1] MILD pain (e.g., does not interfere with normal activities) AND [2] pain comes and goes (cramps) AND [3] present > 48 hours (Exception: This same abdominal pain is a chronic symptom recurrent or ongoing Oren Bracket 03/06/2022 1:49:31 PM PLEASE NOTE: All  timestamps contained within this report are represented as [place] Standard Time. CONFIDENTIALTY NOTICE: This fax transmission is intended only for the addressee. It contains information that is legally privileged, confidential or otherwise protected from use or disclosure. If you are not the intended recipient, you are strictly prohibited from reviewing, disclosing, copying using or disseminating any of this information or taking any action in reliance on or regarding this information. If you have received this fax in error, please notify us immediately by telephone so that we can arrange for its return to [place]. Phone: [phone], Toll-Free: [phone], Fax: [phone] Page: 2 of 2 Call Id: [id number] Guidelines Guideline Title Affirmed Question Affirmed Notes Nurse Date/Time Eilene Ghazi Time) AND present > 4 weeks.) Disp. Time Eilene Ghazi Time) Disposition Final User 03/06/2022 1:27:19 PM Attempt made - message left Oren Bracket 03/06/2022 1:52:40 PM See PCP within 24 Hours Yes Hardin Negus RN, Mardene Celeste Final Disposition 03/06/2022 1:52:40 PM See PCP within 24 Hours Yes Hardin Negus, RN, [place] Disagree/Comply Disagree Caller Understands No PreDisposition Call Doctor Care Advice Given Per Guideline SEE PCP WITHIN 24 HOURS: DIET: CALL BACK IF: * You become worse CARE ADVICE given per Abdominal Pain - Female (Adult) guideline. Comments User: Ledora Bottcher, RN Date/Time Eilene Ghazi Time): 03/06/2022 1:52:38 PM She has an appointment on Tuesday at [place]

## 2022-03-06 NOTE — Telephone Encounter (Signed)
Pt does already have appt with Dr Glori Bickers on 03/10/22.Aaron Edelman at front desk said that pt is out of town.sending note to Dr Glori Bickers.

## 2022-03-06 NOTE — Telephone Encounter (Signed)
Patient called back in stating that she was 5-6 hrs away out of town. Stated that if pain gets worse she will go to ER before her appointment on Tuesday (03/10/22). Thank you!

## 2022-03-06 NOTE — Telephone Encounter (Signed)
Call patient about her appointment she made on 03/10/2022 for right side occurring pain. She stated that she is not in a lot of pain but it comes and goes. Patient was sent to access nurse.

## 2022-03-10 ENCOUNTER — Ambulatory Visit (INDEPENDENT_AMBULATORY_CARE_PROVIDER_SITE_OTHER): Payer: Medicare HMO | Admitting: Family Medicine

## 2022-03-10 ENCOUNTER — Encounter: Payer: Self-pay | Admitting: Family Medicine

## 2022-03-10 VITALS — BP 132/80 | HR 95 | Ht 60.0 in | Wt 163.0 lb

## 2022-03-10 DIAGNOSIS — R1010 Upper abdominal pain, unspecified: Secondary | ICD-10-CM | POA: Insufficient documentation

## 2022-03-10 DIAGNOSIS — K219 Gastro-esophageal reflux disease without esophagitis: Secondary | ICD-10-CM

## 2022-03-10 DIAGNOSIS — E876 Hypokalemia: Secondary | ICD-10-CM | POA: Diagnosis not present

## 2022-03-10 LAB — COMPREHENSIVE METABOLIC PANEL
ALT: 15 U/L (ref 0–35)
AST: 19 U/L (ref 0–37)
Albumin: 4.3 g/dL (ref 3.5–5.2)
Alkaline Phosphatase: 70 U/L (ref 39–117)
BUN: 22 mg/dL (ref 6–23)
CO2: 26 mEq/L (ref 19–32)
Calcium: 9.6 mg/dL (ref 8.4–10.5)
Chloride: 99 mEq/L (ref 96–112)
Creatinine, Ser: 0.91 mg/dL (ref 0.40–1.20)
GFR: 62.61 mL/min (ref >60.00)
Glucose, Bld: 143 mg/dL — ABNORMAL HIGH (ref 70–99)
Potassium: 3.4 mEq/L — ABNORMAL LOW (ref 3.5–5.1)
Sodium: 141 mEq/L (ref 135–145)
Total Bilirubin: 0.4 mg/dL (ref 0.2–1.2)
Total Protein: 7.1 g/dL (ref 6.0–8.3)

## 2022-03-10 LAB — CBC WITH DIFFERENTIAL/PLATELET
Basophils Absolute: 0.1 10*3/uL (ref 0.0–0.1)
Basophils Relative: 0.9 % (ref 0.0–3.0)
Eosinophils Absolute: 0.1 10*3/uL (ref 0.0–0.7)
Eosinophils Relative: 1.7 % (ref 0.0–5.0)
HCT: 41.5 % (ref 36.0–46.0)
Hemoglobin: 14 g/dL (ref 12.0–15.0)
Lymphocytes Relative: 23.6 % (ref 12.0–46.0)
Lymphs Abs: 1.9 10*3/uL (ref 0.7–4.0)
MCHC: 33.8 g/dL (ref 30.0–36.0)
MCV: 90 fl (ref 78.0–100.0)
Monocytes Absolute: 0.5 10*3/uL (ref 0.1–1.0)
Monocytes Relative: 5.7 % (ref 3.0–12.0)
Neutro Abs: 5.5 10*3/uL (ref 1.4–7.7)
Neutrophils Relative %: 68.1 % (ref 43.0–77.0)
Platelets: 270 10*3/uL (ref 150.0–400.0)
RBC: 4.61 Mil/uL (ref 3.87–5.11)
RDW: 13.7 % (ref 11.5–15.5)
WBC: 8 10*3/uL (ref 4.0–10.5)

## 2022-03-10 LAB — LIPASE: Lipase: 42 U/L (ref 11.0–59.0)

## 2022-03-10 MED ORDER — OMEPRAZOLE 20 MG PO CPDR: 20.0000 mg | DELAYED_RELEASE_CAPSULE | Freq: Every day | ORAL | 3 refills | Status: DC

## 2022-03-10 MED ORDER — POTASSIUM CHLORIDE ER 10 MEQ PO TBCR: 10.0000 meq | EXTENDED_RELEASE_TABLET | Freq: Every day | ORAL | 1 refills | Status: DC

## 2022-03-10 NOTE — Assessment & Plan Note (Addendum)
K incl in todays labs Trying to get more dietary K   Addendum Lab Results  Component Value Date   K 3.4 (L) 03/10/2022   Sent in klor con 10 meq to take daily  Re check planned

## 2022-03-10 NOTE — Assessment & Plan Note (Signed)
Upper abd pain, usually R to middle with gas  6 weeks on and off  Reviewed GI notes from 2015-16 with EGD and [place]  She never had a planned hida at that time  Noted fam h/o panc cancer in grandparent  325 mg asa daily for stroke/with food , no other nsaids , no etoh nad minimal carbonation   Diff incl gastritis, gallstones , less likely pancreas problem  inst to inc omeprazole 20 mg to daily  Avoid nsaid Watch caffeine and avoid carbonation  Keep food diary if needed-adv her fiber more slowly (recent inc in produce)  Labs ordered abd [place] ordered  Will make plan based on results

## 2022-03-10 NOTE — Patient Instructions (Addendum)
You may have to advance your dietary fiber more slowly  Pay attention for possible food triggers   Drink lots of water   Go back up on the omeprazole to daily   Labs today   I want to check an ultrasound of your abdomen  If you don't get a call in 1-2 weeks please call us   If symptoms suddenly worsen let us know

## 2022-03-10 NOTE — Assessment & Plan Note (Signed)
Omeprazole qod  No heartburn but having some epigastric pain and belching   Will inc to daily 20 mg  Update if not improving

## 2022-03-10 NOTE — Progress Notes (Signed)
Subjective:    Patient ID: Brianna Espinoza, female    DOB: 05/27/1951, 71 y.o.   MRN: [id number]  HPI Pr presents for abdominal pain   Wt Readings from Last 3 Encounters:  03/10/22 163 lb (73.9 kg)  01/13/22 162 lb (73.5 kg)  01/05/22 163 lb (73.9 kg)   31.83 kg/m  Eating tons of veggies recently- out of the garden    Upper abdominal pain (right to middle) - 6 weeks ago on/off  Worse with eating  On and off  Gas for 20 minutes after eating -belching and flatus  Feels better after that  Wakes up in am with gas    No n/v/d Unsure of particular food triggers   Carbonation - once per wk has ginger ale  Caffeine - coffee in am (bothers her)- 1/2 caff /none for rest of the day  Nsaid 325 mg asa daily with food  Alcohol -a glass of wine twice a week  Abx- none lately   EGD in 2016 showed gastritis  Bx were neg No H pylori  Nl abd [place] in 2015  HIDA- has planned it and then had a stroke so did not do it   This feels different from back then   She takes omeprazole for GERD 20 mg every other day  Otc:   occ tums/not usually   Little to no heartburn   MGF had pancreatic cancer   Cologuard neg 06/2021  Hypokalemia Lab Results  Component Value Date   CREATININE 0.84 01/06/2022   BUN 20 01/06/2022   NA 140 01/06/2022   K 3.3 (L) 01/06/2022   CL 100 01/06/2022   CO2 31 01/06/2022   Takes hctz  Trying to inc K in diet  Was due for lab in sept   Patient Active Problem List   Diagnosis Date Noted   Upper abdominal pain 03/10/2022   Hypokalemia 01/13/2022   Colon cancer screening 06/02/2021   Grief reaction 12/13/2015   Routine general medical examination at a health care facility 11/19/2015   Estrogen deficiency 04/01/2015   Encounter for Medicare annual wellness exam 11/16/2014   Prediabetes 11/16/2014   History of cerebral infarction 10/03/2014   Essential hypertension 10/03/2014   Obesity 09/21/2011   Osteopenia 03/12/2009   Hyperlipidemia 12/13/2006    ALLERGIC RHINITIS 12/13/2006   GERD 12/13/2006   OSTEOARTHRITIS 12/13/2006   Past Medical History:  Diagnosis Date   Allergy    allergic rhinitis   Arthritis    OA   Fibrocystic breast    GERD (gastroesophageal reflux disease)    Hyperlipidemia    Obesity    Osteopenia    Stroke ([place])    cerebral infarction   Tibia fracture 2012   playing golf    Past Surgical History:  Procedure Laterality Date   BREAST CYST ASPIRATION  1980s   aspirated breast lump   COLONOSCOPY  2012   ESOPHAGOGASTRODUODENOSCOPY ENDOSCOPY  09/18/14   INGUINAL HERNIA REPAIR Right 1956   TUBAL LIGATION  1976   lap   Social History   Tobacco Use   Smoking status: Never   Smokeless tobacco: Never  Vaping Use   Vaping Use: Never used  Substance Use Topics   Alcohol use: Yes    Alcohol/week: 3.0 standard drinks of alcohol    Types: 3 Glasses of wine per week    Comment: occ-wine   Drug use: No   Family History  Problem Relation Age of Onset   Arthritis Mother  Hyperlipidemia Mother    Hypertension Mother    COPD Mother    Asthma Mother    Stroke Father    Heart disease Father    Pancreatic cancer Maternal Grandfather    Heart disease Maternal Grandfather    Arthritis Maternal Grandmother    Hypertension Maternal Grandmother    Stroke Maternal Grandmother    Colon cancer Neg Hx    Esophageal cancer Neg Hx    Rectal cancer Neg Hx    Stomach cancer Neg Hx    Breast cancer Neg Hx    Allergies  Allergen Reactions   Buspar [Buspirone]     nausea   Lipitor [Atorvastatin] Other (See Comments)    Muscle pain    Tetanus Toxoid Hives   Wellbutrin [Bupropion] Hives   Acetaminophen Rash   Septra [Sulfamethoxazole-Trimethoprim] Rash    Body rash   Current Outpatient Medications on File Prior to Visit  Medication Sig Dispense Refill   aspirin EC 325 MG tablet Take 1 tablet (325 mg total) by mouth daily. 90 tablet 3   Calcium Carbonate-Vit D-Min 600-400 MG-UNIT TABS Take 2 tablets by  mouth daily.     fexofenadine (ALLEGRA) 180 MG tablet Take 180 mg by mouth daily as needed (during allergy season).      fluticasone (FLONASE) 50 MCG/ACT nasal spray USE 2 SPRAYS INTO EACH NOSTRIL ONCE DAILY AS NEEDED ALLERGIES/RHINITIS 48 g 3   hydrochlorothiazide (HYDRODIURIL) 25 MG tablet TAKE 1 TABLET BY MOUTH ONCE DAILY 90 tablet 3   lovastatin (MEVACOR) 20 MG tablet TAKE 1 TABLET BY MOUTH EVERY OTHER DAY 45 tablet 3   Multiple Vitamin (MULTIVITAMIN) tablet Take 1 tablet by mouth daily.     Probiotic Product (ALIGN) 4 MG CAPS Take by mouth.     No current facility-administered medications on file prior to visit.    Review of Systems  Constitutional:  Negative for activity change, appetite change, fatigue, fever and unexpected weight change.  HENT:  Negative for congestion, ear pain, rhinorrhea, sinus pressure and sore throat.   Eyes:  Negative for pain, redness and visual disturbance.  Respiratory:  Negative for cough, shortness of breath and wheezing.   Cardiovascular:  Negative for chest pain and palpitations.  Gastrointestinal:  Positive for abdominal distention and abdominal pain. Negative for anal bleeding, blood in stool, constipation, diarrhea, nausea, rectal pain and vomiting.  Endocrine: Negative for polydipsia and polyuria.  Genitourinary:  Negative for dysuria, frequency and urgency.  Musculoskeletal:  Negative for arthralgias, back pain and myalgias.  Skin:  Negative for pallor and rash.  Allergic/Immunologic: Negative for environmental allergies.  Neurological:  Negative for dizziness, syncope and headaches.  Hematological:  Negative for adenopathy. Does not bruise/bleed easily.  Psychiatric/Behavioral:  Negative for decreased concentration and dysphoric mood. The patient is not nervous/anxious.        Objective:   Physical Exam Constitutional:      General: She is not in acute distress.    Appearance: She is well-developed. She is obese. She is not ill-appearing or  diaphoretic.  HENT:     Head: Normocephalic and atraumatic.  Eyes:     Conjunctiva/sclera: Conjunctivae normal.     Pupils: Pupils are equal, round, and reactive to light.  Neck:     Thyroid: No thyromegaly.     Vascular: No carotid bruit or JVD.  Cardiovascular:     Rate and Rhythm: Normal rate and regular rhythm.     Heart sounds: Normal heart sounds.  No gallop.  Pulmonary:     Effort: Pulmonary effort is normal. No respiratory distress.     Breath sounds: Normal breath sounds. No wheezing or rales.  Abdominal:     General: Abdomen is protuberant. Bowel sounds are normal. There is no distension or abdominal bruit.     Palpations: Abdomen is soft. There is no fluid wave, hepatomegaly, splenomegaly, mass or pulsatile mass.     Tenderness: There is abdominal tenderness in the right upper quadrant and epigastric area. There is no right CVA tenderness, left CVA tenderness, guarding or rebound. Negative signs include Murphy's sign and McBurney's sign.     Comments: Mild tenderness to deep palp epigastric and RUQ  Musculoskeletal:     Cervical back: Normal range of motion and neck supple.     Right lower leg: No edema.     Left lower leg: No edema.  Lymphadenopathy:     Cervical: No cervical adenopathy.  Skin:    General: Skin is warm and dry.     Coloration: Skin is not jaundiced or pale.     Findings: No bruising, erythema or rash.  Neurological:     Mental Status: She is alert.     Coordination: Coordination normal.     Deep Tendon Reflexes: Reflexes are normal and symmetric. Reflexes normal.  Psychiatric:        Mood and Affect: Mood normal.           Assessment & Plan:   Problem List Items Addressed This Visit       Digestive   GERD    Omeprazole qod  No heartburn but having some epigastric pain and belching   Will inc to daily 20 mg  Update if not improving        Relevant Medications   omeprazole (PRILOSEC) 20 MG capsule     Other   Hypokalemia     K incl in todays labs Trying to get more dietary K       Upper abdominal pain - Primary    Upper abd pain, usually R to middle with gas  6 weeks on and off  Reviewed GI notes from 2015-16 with EGD and [place]  She never had a planned hida at that time  Noted fam h/o panc cancer in grandparent  325 mg asa daily for stroke/with food , no other nsaids , no etoh nad minimal carbonation   Diff incl gastritis, gallstones , less likely pancreas problem  inst to inc omeprazole 20 mg to daily  Avoid nsaid Watch caffeine and avoid carbonation  Keep food diary if needed-adv her fiber more slowly (recent inc in produce)  Labs ordered abd [place] ordered  Will make plan based on results       Relevant Orders   US Abdomen Complete   CBC with Differential/Platelet   Comprehensive metabolic panel   Lipase

## 2022-03-10 NOTE — Addendum Note (Signed)
Addended by: Loura Pardon A on: 03/10/2022 05:01 PM   Modules accepted: Orders

## 2022-03-12 ENCOUNTER — Ambulatory Visit
Admission: RE | Admit: 2022-03-12 | Discharge: 2022-03-12 | Disposition: A | Discharge status: Home / Self Care | Payer: Medicare HMO | Source: Ambulatory Visit | Attending: Family Medicine | Admitting: Family Medicine

## 2022-03-12 DIAGNOSIS — R1010 Upper abdominal pain, unspecified: Secondary | ICD-10-CM

## 2022-03-17 ENCOUNTER — Encounter: Payer: Self-pay | Admitting: Family Medicine

## 2022-03-17 DIAGNOSIS — Q6102 Congenital multiple renal cysts: Secondary | ICD-10-CM

## 2022-03-25 ENCOUNTER — Telehealth: Payer: Self-pay | Admitting: Family Medicine

## 2022-03-25 DIAGNOSIS — E876 Hypokalemia: Secondary | ICD-10-CM

## 2022-03-25 NOTE — Telephone Encounter (Signed)
-----   Message from Ellamae Sia sent at 03/16/2022  2:37 PM EDT ----- Regarding: Lab orders for Thursday, 8.24.23 Lab orders, potassium??

## 2022-03-26 ENCOUNTER — Other Ambulatory Visit (INDEPENDENT_AMBULATORY_CARE_PROVIDER_SITE_OTHER): Payer: Medicare HMO

## 2022-03-26 DIAGNOSIS — E876 Hypokalemia: Secondary | ICD-10-CM

## 2022-03-26 LAB — BASIC METABOLIC PANEL
BUN: 23 mg/dL (ref 6–23)
CO2: 30 mEq/L (ref 19–32)
Calcium: 9.3 mg/dL (ref 8.4–10.5)
Chloride: 100 mEq/L (ref 96–112)
Creatinine, Ser: 0.99 mg/dL (ref 0.40–1.20)
GFR: 56.57 mL/min — ABNORMAL LOW (ref >60.00)
Glucose, Bld: 96 mg/dL (ref 70–99)
Potassium: 3.7 mEq/L (ref 3.5–5.1)
Sodium: 138 mEq/L (ref 135–145)

## 2022-03-27 ENCOUNTER — Other Ambulatory Visit: Payer: Self-pay

## 2022-03-27 MED ORDER — POTASSIUM CHLORIDE ER 10 MEQ PO TBCR: 10.0000 meq | EXTENDED_RELEASE_TABLET | Freq: Every day | ORAL | 11 refills | Status: DC

## 2022-04-10 ENCOUNTER — Encounter: Payer: Self-pay | Admitting: Family Medicine

## 2022-04-15 ENCOUNTER — Other Ambulatory Visit: Payer: Medicare HMO

## 2022-06-03 ENCOUNTER — Other Ambulatory Visit: Payer: Self-pay | Admitting: Family Medicine

## 2022-06-03 DIAGNOSIS — Z1231 Encounter for screening mammogram for malignant neoplasm of breast: Secondary | ICD-10-CM

## 2022-07-06 ENCOUNTER — Ambulatory Visit
Admission: RE | Admit: 2022-07-06 | Discharge: 2022-07-06 | Disposition: A | Discharge status: Home / Self Care | Payer: Medicare HMO | Source: Ambulatory Visit | Attending: Family Medicine | Admitting: Family Medicine

## 2022-07-06 DIAGNOSIS — Z1231 Encounter for screening mammogram for malignant neoplasm of breast: Secondary | ICD-10-CM

## 2022-10-17 ENCOUNTER — Encounter: Payer: Self-pay | Admitting: Family Medicine

## 2022-10-19 MED ORDER — FLUTICASONE PROPIONATE 50 MCG/ACT NA SUSP: NASAL | 0 refills | Status: DC

## 2022-11-06 ENCOUNTER — Other Ambulatory Visit: Payer: Self-pay | Admitting: Family Medicine

## 2022-11-30 ENCOUNTER — Other Ambulatory Visit: Payer: Self-pay | Admitting: Family Medicine

## 2023-01-07 ENCOUNTER — Ambulatory Visit (INDEPENDENT_AMBULATORY_CARE_PROVIDER_SITE_OTHER): Payer: Medicare HMO

## 2023-01-07 VITALS — Ht 61.5 in | Wt 157.0 lb

## 2023-01-07 DIAGNOSIS — Z780 Asymptomatic menopausal state: Secondary | ICD-10-CM

## 2023-01-07 DIAGNOSIS — Z1231 Encounter for screening mammogram for malignant neoplasm of breast: Secondary | ICD-10-CM | POA: Diagnosis not present

## 2023-01-07 DIAGNOSIS — Z0000 Encounter for general adult medical examination without abnormal findings: Secondary | ICD-10-CM | POA: Diagnosis not present

## 2023-01-07 NOTE — Patient Instructions (Signed)
Brianna Espinoza , Thank you for taking time to come for your Medicare Wellness Visit. I appreciate your ongoing commitment to your health goals. Please review the following plan we discussed and let me know if I can assist you in the future.   These are the goals we discussed:  Goals      DIET - EAT MORE FRUITS AND VEGETABLES     Increase physical activity     Starting 12/02/2018, I will continue to exercise for 45 minutes daily.      Patient Stated     01/03/2020, I will continue to play golf 2 days a week for over 1 hour.      Patient Stated     01/03/2021, I will continue to walk daily for about 1 hour and play golf during the week.      Patient Stated     Exercise everyday.        This is a list of the screening recommended for you and due dates:  Health Maintenance  Topic Date Due   DTaP/Tdap/Td vaccine (1 - Tdap) Never done   Zoster (Shingles) Vaccine (1 of 2) Never done   COVID-19 Vaccine (4 - 2023-24 season) 04/03/2022   Colon Cancer Screening  01/14/2023*   Flu Shot  03/04/2023   Medicare Annual Wellness Visit  01/07/2024   Mammogram  07/06/2024   Pneumonia Vaccine  Completed   DEXA scan (bone density measurement)  Completed   Hepatitis C Screening  Completed   HPV Vaccine  Aged Out  *Topic was postponed. The date shown is not the original due date.    Advanced directives: in chart  Conditions/risks identified: none  Next appointment: Follow up in one year for your annual wellness visit 01/11/24 @ 8:45 telephone   Preventive Care 65 Years and Older, Female Preventive care refers to lifestyle choices and visits with your health care provider that can promote health and wellness. What does preventive care include? A yearly physical exam. This is also called an annual well check. Dental exams once or twice a year. Routine eye exams. Ask your health care provider how often you should have your eyes checked. Personal lifestyle choices, including: Daily care of your teeth  and gums. Regular physical activity. Eating a healthy diet. Avoiding tobacco and drug use. Limiting alcohol use. Practicing safe sex. Taking low-dose aspirin every day. Taking vitamin and mineral supplements as recommended by your health care provider. What happens during an annual well check? The services and screenings done by your health care provider during your annual well check will depend on your age, overall health, lifestyle risk factors, and family history of disease. Counseling  Your health care provider may ask you questions about your: Alcohol use. Tobacco use. Drug use. Emotional well-being. Home and relationship well-being. Sexual activity. Eating habits. History of falls. Memory and ability to understand (cognition). Work and work Astronomer. Reproductive health. Screening  You may have the following tests or measurements: Height, weight, and BMI. Blood pressure. Lipid and cholesterol levels. These may be checked every 5 years, or more frequently if you are over 52 years old. Skin check. Lung cancer screening. You may have this screening every year starting at age 105 if you have a 30-pack-year history of smoking and currently smoke or have quit within the past 15 years. Fecal occult blood test (FOBT) of the stool. You may have this test every year starting at age 74. Flexible sigmoidoscopy or colonoscopy. You may have a sigmoidoscopy  every 5 years or a colonoscopy every 10 years starting at age 83. Hepatitis C blood test. Hepatitis B blood test. Sexually transmitted disease (STD) testing. Diabetes screening. This is done by checking your blood sugar (glucose) after you have not eaten for a while (fasting). You may have this done every 1-3 years. Bone density scan. This is done to screen for osteoporosis. You may have this done starting at age 40. Mammogram. This may be done every 1-2 years. Talk to your health care provider about how often you should have regular  mammograms. Talk with your health care provider about your test results, treatment options, and if necessary, the need for more tests. Vaccines  Your health care provider may recommend certain vaccines, such as: Influenza vaccine. This is recommended every year. Tetanus, diphtheria, and acellular pertussis (Tdap, Td) vaccine. You may need a Td booster every 10 years. Zoster vaccine. You may need this after age 6. Pneumococcal 13-valent conjugate (PCV13) vaccine. One dose is recommended after age 42. Pneumococcal polysaccharide (PPSV23) vaccine. One dose is recommended after age 72. Talk to your health care provider about which screenings and vaccines you need and how often you need them. This information is not intended to replace advice given to you by your health care provider. Make sure you discuss any questions you have with your health care provider. Document Released: 08/16/2015 Document Revised: 04/08/2016 Document Reviewed: 05/21/2015 Elsevier Interactive Patient Education  2017 [employer].  Fall Prevention in the Home Falls can cause injuries. They can happen to people of all ages. There are many things you can do to make your home safe and to help prevent falls. What can I do on the outside of my home? Regularly fix the edges of walkways and driveways and fix any cracks. Remove anything that might make you trip as you walk through a door, such as a raised step or threshold. Trim any bushes or trees on the path to your home. Use bright outdoor lighting. Clear any walking paths of anything that might make someone trip, such as rocks or tools. Regularly check to see if handrails are loose or broken. Make sure that both sides of any steps have handrails. Any raised decks and porches should have guardrails on the edges. Have any leaves, snow, or ice cleared regularly. Use sand or salt on walking paths during winter. Clean up any spills in your garage right away. This includes oil  or grease spills. What can I do in the bathroom? Use night lights. Install grab bars by the toilet and in the tub and shower. Do not use towel bars as grab bars. Use non-skid mats or decals in the tub or shower. If you need to sit down in the shower, use a plastic, non-slip stool. Keep the floor dry. Clean up any water that spills on the floor as soon as it happens. Remove soap buildup in the tub or shower regularly. Attach bath mats securely with double-sided non-slip rug tape. Do not have throw rugs and other things on the floor that can make you trip. What can I do in the bedroom? Use night lights. Make sure that you have a light by your bed that is easy to reach. Do not use any sheets or blankets that are too big for your bed. They should not hang down onto the floor. Have a firm chair that has side arms. You can use this for support while you get dressed. Do not have throw rugs and other things  on the floor that can make you trip. What can I do in the kitchen? Clean up any spills right away. Avoid walking on wet floors. Keep items that you use a lot in easy-to-reach places. If you need to reach something above you, use a strong step stool that has a grab bar. Keep electrical cords out of the way. Do not use floor polish or wax that makes floors slippery. If you must use wax, use non-skid floor wax. Do not have throw rugs and other things on the floor that can make you trip. What can I do with my stairs? Do not leave any items on the stairs. Make sure that there are handrails on both sides of the stairs and use them. Fix handrails that are broken or loose. Make sure that handrails are as long as the stairways. Check any carpeting to make sure that it is firmly attached to the stairs. Fix any carpet that is loose or worn. Avoid having throw rugs at the top or bottom of the stairs. If you do have throw rugs, attach them to the floor with carpet tape. Make sure that you have a light  switch at the top of the stairs and the bottom of the stairs. If you do not have them, ask someone to add them for you. What else can I do to help prevent falls? Wear shoes that: Do not have high heels. Have rubber bottoms. Are comfortable and fit you well. Are closed at the toe. Do not wear sandals. If you use a stepladder: Make sure that it is fully opened. Do not climb a closed stepladder. Make sure that both sides of the stepladder are locked into place. Ask someone to hold it for you, if possible. Clearly mark and make sure that you can see: Any grab bars or handrails. First and last steps. Where the edge of each step is. Use tools that help you move around (mobility aids) if they are needed. These include: Canes. Walkers. Scooters. Crutches. Turn on the lights when you go into a dark area. Replace any light bulbs as soon as they burn out. Set up your furniture so you have a clear path. Avoid moving your furniture around. If any of your floors are uneven, fix them. If there are any pets around you, be aware of where they are. Review your medicines with your doctor. Some medicines can make you feel dizzy. This can increase your chance of falling. Ask your doctor what other things that you can do to help prevent falls. This information is not intended to replace advice given to you by your health care provider. Make sure you discuss any questions you have with your health care provider. Document Released: 05/16/2009 Document Revised: 12/26/2015 Document Reviewed: 08/24/2014 Elsevier Interactive Patient Education  2017 [employer].

## 2023-01-07 NOTE — Progress Notes (Signed)
I connected with  Brianna Espinoza on 01/07/23 by a audio enabled telemedicine application and verified that I am speaking with the correct person using two identifiers.  Patient Location: Home  Provider Location: Home Office  I discussed the limitations of evaluation and management by telemedicine. The patient expressed understanding and agreed to proceed.  Subjective:   Brianna Espinoza is a 72 y.o. female who presents for Medicare Annual (Subsequent) preventive examination.  Review of Systems      Cardiac Risk Factors include: advanced age (>31men, >92 women);hypertension;obesity (BMI >30kg/m2)     Objective:    Today's Vitals   01/07/23 1026  Weight: 157 lb (71.2 kg)  Height: 5' 1.5" (1.562 m)   Body mass index is 29.18 kg/m.     01/07/2023   10:37 AM 01/05/2022   11:11 AM 01/03/2021   11:22 AM 01/03/2020   11:19 AM 12/02/2018   11:41 AM 11/24/2017    8:35 AM 11/23/2016    8:36 AM  Advanced Directives  Does Patient Have a Medical Advance Directive? Yes Yes Yes Yes Yes Yes Yes  Type of Estate agent of [place];Living will Healthcare Power of [place];Living will Healthcare Power of [place];Living will Healthcare Power of [place];Living will Healthcare Power of [place];Living will Healthcare Power of [place];Living will Living will;Healthcare Power of Attorney  Does patient want to make changes to medical advance directive? No - Patient declined Yes (Inpatient - patient defers changing a medical advance directive and declines information at this time)    No - Patient declined   Copy of Healthcare Power of Attorney in Chart? Yes - validated most recent copy scanned in chart (See row information) Yes - validated most recent copy scanned in chart (See row information) No - copy requested No - copy requested No - copy requested No - copy requested No - copy requested    Current Medications (verified) Outpatient Encounter Medications as of 01/07/2023  Medication  Sig   aspirin EC 325 MG tablet Take 1 tablet (325 mg total) by mouth daily.   Calcium Carbonate-Vit D-Min 600-400 MG-UNIT TABS Take 2 tablets by mouth daily.   fexofenadine (ALLEGRA) 180 MG tablet Take 180 mg by mouth daily as needed (during allergy season).    fluticasone (FLONASE) 50 MCG/ACT nasal spray USE 2 SPRAYS INTO EACH NOSTRIL ONCE DAILY AS NEEDED ALLERGIES/RHINITIS   hydrochlorothiazide (HYDRODIURIL) 25 MG tablet TAKE 1 TABLET BY MOUTH ONCE DAILY   lovastatin (MEVACOR) 20 MG tablet TAKE 1 TABLET BY MOUTH EVERY OTHER DAY   Multiple Vitamin (MULTIVITAMIN) tablet Take 1 tablet by mouth daily.   omeprazole (PRILOSEC) 20 MG capsule Take 1 capsule (20 mg total) by mouth daily.   potassium chloride (KLOR-CON 10) 10 MEQ tablet Take 1 tablet (10 mEq total) by mouth daily.   Probiotic Product (ALIGN) 4 MG CAPS Take by mouth.   No facility-administered encounter medications on file as of 01/07/2023.    Allergies (verified) Buspar [buspirone], Lipitor [atorvastatin], Tetanus toxoid, Wellbutrin [bupropion], Acetaminophen, and Septra [sulfamethoxazole-trimethoprim]   History: Past Medical History:  Diagnosis Date   Allergy    allergic rhinitis   Arthritis    OA   Fibrocystic breast    GERD (gastroesophageal reflux disease)    Hyperlipidemia    Obesity    Osteopenia    Stroke [facility])    cerebral infarction   Tibia fracture 2012   playing golf    Past Surgical History:  Procedure Laterality Date   BREAST CYST ASPIRATION  1980s   aspirated breast lump   COLONOSCOPY  2012   ESOPHAGOGASTRODUODENOSCOPY ENDOSCOPY  09/18/14   INGUINAL HERNIA REPAIR Right 1956   TUBAL LIGATION  1976   lap   Family History  Problem Relation Age of Onset   Arthritis Mother    Hyperlipidemia Mother    Hypertension Mother    COPD Mother    Asthma Mother    Stroke Father    Heart disease Father    Pancreatic cancer Maternal Grandfather    Heart disease Maternal Grandfather    Arthritis Maternal  Grandmother    Hypertension Maternal Grandmother    Stroke Maternal Grandmother    Colon cancer Neg Hx    Esophageal cancer Neg Hx    Rectal cancer Neg Hx    Stomach cancer Neg Hx    Breast cancer Neg Hx    Social History   Socioeconomic History   Marital status: Married    Spouse name: Not on file   Number of children: 1   Years of education: 14   Highest education level: Not on file  Occupational History   Occupation: retired  Tobacco Use   Smoking status: Never   Smokeless tobacco: Never  Vaping Use   Vaping Use: Never used  Substance and Sexual Activity   Alcohol use: Yes    Alcohol/week: 3.0 standard drinks of alcohol    Types: 3 Glasses of wine per week    Comment: occ-wine   Drug use: No   Sexual activity: Not on file  Other Topics Concern   Not on file  Social History Narrative   Patient is married with one child.   Patient is left handed.   Patient has 14 yrs of education.   Patient drinks 1 and 1/2 cup daily.   Social Determinants of Health   Financial Resource Strain: Low Risk  (01/07/2023)   Overall Financial Resource Strain (CARDIA)    Difficulty of Paying Living Expenses: Not hard at all  Food Insecurity: No Food Insecurity (01/07/2023)   Hunger Vital Sign    Worried About Running Out of Food in the Last Year: Never true    Ran Out of Food in the Last Year: Never true  Transportation Needs: No Transportation Needs (01/07/2023)   PRAPARE - Administrator, Civil Service (Medical): No    Lack of Transportation (Non-Medical): No  Physical Activity: Sufficiently Active (01/07/2023)   Exercise Vital Sign    Days of Exercise per Week: 7 days    Minutes of Exercise per Session: 60 min  Stress: No Stress Concern Present (01/07/2023)   [employer] of Occupational Health - Occupational Stress Questionnaire    Feeling of Stress : Not at all  Social Connections: Socially Integrated (01/07/2023)   Social Connection and Isolation Panel [NHANES]     Frequency of Communication with Friends and Family: More than three times a week    Frequency of Social Gatherings with Friends and Family: More than three times a week    Attends Religious Services: More than 4 times per year    Active Member of [employer] or Organizations: Yes    Attends Engineer, structural: More than 4 times per year    Marital Status: Married    Tobacco Counseling Counseling given: Not Answered   Clinical Intake:  Pre-visit preparation completed: Yes  Pain : No/denies pain     Nutritional Risks: None Diabetes: No  How often do you need to have someone help  you when you read instructions, pamphlets, or other written materials from your doctor or pharmacy?: 1 - Never  Diabetic? no  Interpreter Needed?: No  Information entered by :: C.Darek Eifler LPN   Activities of Daily Living    01/07/2023   10:37 AM 01/05/2023    7:57 AM  In your present state of health, do you have any difficulty performing the following activities:  Hearing? 0 0  Vision? 0 0  Difficulty concentrating or making decisions? 0 0  Walking or climbing stairs? 0 0  Dressing or bathing? 0 0  Doing errands, shopping? 0 0  Preparing Food and eating ? N N  Using the Toilet? N N  In the past six months, have you accidently leaked urine? N N  Do you have problems with loss of bowel control? N N  Managing your Medications? N N  Managing your Finances? N N  Housekeeping or managing your Housekeeping? N N    Patient Care Team: Tower, Audrie Gallus, MD as PCP - General Brooke Dare, Earl Gala, MD as Consulting Physician (Ophthalmology) [facility]  Indicate any recent Medical Services you may have received from other than [facility] providers in the past year (date may be approximate).     Assessment:   This is a routine wellness examination for Brianna Espinoza.  Hearing/Vision screen Hearing Screening - Comments:: aids Vision Screening - Comments:: Readers- [facility]  Dietary  issues and exercise activities discussed: Current Exercise Habits: Home exercise routine, Type of exercise: walking;Other - see comments (golf), Time (Minutes): 60, Frequency (Times/Week): 7, Weekly Exercise (Minutes/Week): 420, Intensity: Moderate, Exercise limited by: None identified   Goals Addressed             This Visit's Progress    Patient Stated       Exercise everyday.       Depression Screen    01/07/2023   10:36 AM 03/10/2022   10:23 AM 01/05/2022   11:08 AM 01/03/2021   11:24 AM 01/03/2020   11:21 AM 12/02/2018   11:42 AM 11/24/2017    8:56 AM  PHQ 2/9 Scores  PHQ - 2 Score 0 0 0 0 0 0 2  PHQ- 9 Score   0 0 0 0 2    Fall Risk    01/07/2023   10:37 AM 01/05/2023    7:57 AM 03/10/2022   10:23 AM 01/05/2022   11:14 AM 01/03/2021   11:23 AM  Fall Risk   Falls in the past year? 0 0 0 0 0  Number falls in past yr: 0 0  0 0  Injury with Fall? 0 0  0 0  Risk for fall due to : No Fall Risks   No Fall Risks Medication side effect  Follow up Falls prevention discussed;Falls evaluation completed  Falls evaluation completed Falls evaluation completed Falls evaluation completed;Falls prevention discussed    FALL RISK PREVENTION PERTAINING TO THE HOME:  Any stairs in or around the home? No  If so, are there any without handrails? No  Home free of loose throw rugs in walkways, pet beds, electrical cords, etc? Yes  Adequate lighting in your home to reduce risk of falls? Yes   ASSISTIVE DEVICES UTILIZED TO PREVENT FALLS:  Life alert? No  Use of a cane, walker or w/c? No  Grab bars in the bathroom? Yes  Shower chair or bench in shower? Yes  Elevated toilet seat or a handicapped toilet? Yes    Cognitive Function:  01/03/2021   11:37 AM 01/03/2020   11:31 AM 12/02/2018    1:37 PM 11/24/2017    8:34 AM 11/23/2016    8:14 AM  MMSE - Mini Mental State Exam  Not completed: Refused      Orientation to time  5 5 5 5   Orientation to Place  5 5 5 5   Registration  3 3 3 3   Attention/  Calculation  5 0 0 0  Recall  3 3 3 3   Language- name 2 objects   0 0 0  Language- repeat  1 1 1 1   Language- follow 3 step command   0 3 3  Language- read & follow direction   0 0 0  Write a sentence   0 0 0  Copy design   0 0 0  Total score   17 20 20         01/07/2023   10:38 AM 01/05/2022   11:17 AM  6CIT Screen  What Year? 0 points 0 points  What month? 0 points 0 points  What time? 0 points 0 points  Count back from 20 0 points 0 points  Months in reverse 0 points 0 points  Repeat phrase 0 points 0 points  Total Score 0 points 0 points    Immunizations Immunization History  Administered Date(s) Administered   Influenza Split 04/29/2011, 07/20/2012   Influenza,inj,Quad PF,6+ Mos 05/30/2014, 05/24/2015, 06/02/2016, 06/08/2017, 06/09/2018, 04/25/2019, 04/18/2020   Influenza-Unspecified 05/25/2013   PFIZER(Purple Top)SARS-COV-2 Vaccination 09/12/2019, 10/03/2019, 05/15/2020   Pneumococcal Conjugate-13 11/16/2014   Pneumococcal Polysaccharide-23 11/19/2015   Zoster, Live 02/21/2014    TDAP status: Due, Education has been provided regarding the importance of this vaccine. Advised may receive this vaccine at local pharmacy or Health Dept. Aware to provide a copy of the vaccination record if obtained from local pharmacy or Health Dept. Verbalized acceptance and understanding.  Flu Vaccine status: Up to date  Pneumococcal vaccine status: Up to date  Covid-19 vaccine status: Information provided on how to obtain vaccines.   Qualifies for Shingles Vaccine? Yes   Zostavax completed Yes   Shingrix Completed?: No.    Education has been provided regarding the importance of this vaccine. Patient has been advised to call insurance company to determine out of pocket expense if they have not yet received this vaccine. Advised may also receive vaccine at local pharmacy or Health Dept. Verbalized acceptance and understanding.  Screening Tests Health Maintenance  Topic Date Due    DTaP/Tdap/Td (1 - Tdap) Never done   Zoster Vaccines- Shingrix (1 of 2) Never done   COVID-19 Vaccine (4 - 2023-24 season) 04/03/2022   Colonoscopy  01/14/2023 (Originally 06/21/2021)   INFLUENZA VACCINE  03/04/2023   Medicare Annual Wellness (AWV)  01/07/2024   MAMMOGRAM  07/06/2024   Pneumonia Vaccine 30+ Years old  Completed   DEXA SCAN  Completed   Hepatitis C Screening  Completed   HPV VACCINES  Aged Out    Health Maintenance  Health Maintenance Due  Topic Date Due   DTaP/Tdap/Td (1 - Tdap) Never done   Zoster Vaccines- Shingrix (1 of 2) Never done   COVID-19 Vaccine (4 - 2023-24 season) 04/03/2022    Colorectal cancer screening: No longer required. Pt declined.  Mammogram status: Completed 07/06/22. Repeat every year  Bone Density status: Completed 09/05/20. Results reflect: Bone density results: OSTEOPENIA. Repeat every 2 years.  Lung Cancer Screening: (Low Dose CT Chest recommended if Age 76-80 years, 30 pack-year currently smoking  OR have quit w/in 15years.) does not qualify.   Lung Cancer Screening Referral: no  Additional Screening:  Hepatitis C Screening: does qualify; Completed 11/23/20  Vision Screening: Recommended annual ophthalmology exams for early detection of glaucoma and other disorders of the eye. Is the patient up to date with their annual eye exam?  Yes  Who is the provider or what is the name of the office in which the patient attends annual eye exams? [facility]  If pt is not established with a provider, would they like to be referred to a provider to establish care? Yes .   Dental Screening: Recommended annual dental exams for proper oral hygiene  Community Resource Referral / Chronic Care Management: CRR required this visit?  No   CCM required this visit?  No      Plan:     I have personally reviewed and noted the following in the patient's chart:   Medical and social history Use of alcohol, tobacco or illicit drugs  Current  medications and supplements including opioid prescriptions. Patient is not currently taking opioid prescriptions. Functional ability and status Nutritional status Physical activity Advanced directives List of other physicians Hospitalizations, surgeries, and ER visits in previous 12 months Vitals Screenings to include cognitive, depression, and falls Referrals and appointments  In addition, I have reviewed and discussed with patient certain preventive protocols, quality metrics, and best practice recommendations. A written personalized care plan for preventive services as well as general preventive health recommendations were provided to patient.     Maryan Puls, LPN   08/08/1094   Nurse Notes: order placed for mammogram.

## 2023-01-17 ENCOUNTER — Telehealth: Payer: Self-pay | Admitting: Family Medicine

## 2023-01-17 DIAGNOSIS — I10 Essential (primary) hypertension: Secondary | ICD-10-CM

## 2023-01-17 DIAGNOSIS — E7800 Pure hypercholesterolemia, unspecified: Secondary | ICD-10-CM

## 2023-01-17 DIAGNOSIS — R7303 Prediabetes: Secondary | ICD-10-CM

## 2023-01-17 NOTE — Telephone Encounter (Signed)
-----   Message from Alvina Chou sent at 01/08/2023 11:59 AM EDT ----- Regarding: Lab orders for Monday, 6.17.24 Patient is scheduled for CPX labs, please order future labs, Thanks , Camelia Eng

## 2023-01-18 ENCOUNTER — Other Ambulatory Visit (INDEPENDENT_AMBULATORY_CARE_PROVIDER_SITE_OTHER): Payer: Medicare HMO

## 2023-01-18 DIAGNOSIS — E7800 Pure hypercholesterolemia, unspecified: Secondary | ICD-10-CM

## 2023-01-18 DIAGNOSIS — R7303 Prediabetes: Secondary | ICD-10-CM | POA: Diagnosis not present

## 2023-01-18 DIAGNOSIS — I10 Essential (primary) hypertension: Secondary | ICD-10-CM

## 2023-01-18 LAB — CBC WITH DIFFERENTIAL/PLATELET
Basophils Absolute: 0.1 10*3/uL (ref 0.0–0.1)
Basophils Relative: 1.1 % (ref 0.0–3.0)
Eosinophils Absolute: 0.2 10*3/uL (ref 0.0–0.7)
Eosinophils Relative: 2.5 % (ref 0.0–5.0)
HCT: 41.2 % (ref 36.0–46.0)
Hemoglobin: 13.7 g/dL (ref 12.0–15.0)
Lymphocytes Relative: 32.5 % (ref 12.0–46.0)
Lymphs Abs: 2.6 10*3/uL (ref 0.7–4.0)
MCHC: 33.2 g/dL (ref 30.0–36.0)
MCV: 92.1 fl (ref 78.0–100.0)
Monocytes Absolute: 0.6 10*3/uL (ref 0.1–1.0)
Monocytes Relative: 7 % (ref 3.0–12.0)
Neutro Abs: 4.5 10*3/uL (ref 1.4–7.7)
Neutrophils Relative %: 56.9 % (ref 43.0–77.0)
Platelets: 283 10*3/uL (ref 150.0–400.0)
RBC: 4.48 Mil/uL (ref 3.87–5.11)
RDW: 13.8 % (ref 11.5–15.5)
WBC: 7.9 10*3/uL (ref 4.0–10.5)

## 2023-01-18 LAB — COMPREHENSIVE METABOLIC PANEL
ALT: 12 U/L (ref 0–35)
AST: 18 U/L (ref 0–37)
Albumin: 4.1 g/dL (ref 3.5–5.2)
Alkaline Phosphatase: 67 U/L (ref 39–117)
BUN: 21 mg/dL (ref 6–23)
CO2: 29 mEq/L (ref 19–32)
Calcium: 9.3 mg/dL (ref 8.4–10.5)
Chloride: 101 mEq/L (ref 96–112)
Creatinine, Ser: 1 mg/dL (ref 0.40–1.20)
GFR: 55.57 mL/min — ABNORMAL LOW (ref >60.00)
Glucose, Bld: 103 mg/dL — ABNORMAL HIGH (ref 70–99)
Potassium: 4.1 mEq/L (ref 3.5–5.1)
Sodium: 139 mEq/L (ref 135–145)
Total Bilirubin: 0.5 mg/dL (ref 0.2–1.2)
Total Protein: 7 g/dL (ref 6.0–8.3)

## 2023-01-18 LAB — LIPID PANEL
Cholesterol: 168 mg/dL (ref 0–200)
HDL: 48.2 mg/dL (ref >39.00)
LDL Cholesterol: 107 mg/dL — ABNORMAL HIGH (ref 0–99)
NonHDL: 119.56
Total CHOL/HDL Ratio: 3
Triglycerides: 61 mg/dL (ref 0.0–149.0)
VLDL: 12.2 mg/dL (ref 0.0–40.0)

## 2023-01-18 LAB — HEMOGLOBIN A1C: Hgb A1c MFr Bld: 6 % (ref 4.6–6.5)

## 2023-01-18 LAB — TSH: TSH: 1.53 u[IU]/mL (ref 0.35–5.50)

## 2023-01-25 ENCOUNTER — Ambulatory Visit (INDEPENDENT_AMBULATORY_CARE_PROVIDER_SITE_OTHER): Payer: Medicare HMO | Admitting: Family Medicine

## 2023-01-25 ENCOUNTER — Encounter: Payer: Self-pay | Admitting: Family Medicine

## 2023-01-25 VITALS — BP 121/80 | HR 68 | Temp 97.8°F | Ht 59.75 in | Wt 159.0 lb

## 2023-01-25 DIAGNOSIS — E6609 Other obesity due to excess calories: Secondary | ICD-10-CM

## 2023-01-25 DIAGNOSIS — M8589 Other specified disorders of bone density and structure, multiple sites: Secondary | ICD-10-CM

## 2023-01-25 DIAGNOSIS — R7303 Prediabetes: Secondary | ICD-10-CM

## 2023-01-25 DIAGNOSIS — Z0000 Encounter for general adult medical examination without abnormal findings: Secondary | ICD-10-CM

## 2023-01-25 DIAGNOSIS — E7800 Pure hypercholesterolemia, unspecified: Secondary | ICD-10-CM

## 2023-01-25 DIAGNOSIS — I10 Essential (primary) hypertension: Secondary | ICD-10-CM

## 2023-01-25 DIAGNOSIS — K219 Gastro-esophageal reflux disease without esophagitis: Secondary | ICD-10-CM

## 2023-01-25 DIAGNOSIS — E876 Hypokalemia: Secondary | ICD-10-CM

## 2023-01-25 DIAGNOSIS — Z6831 Body mass index (BMI) 31.0-31.9, adult: Secondary | ICD-10-CM

## 2023-01-25 DIAGNOSIS — Z1211 Encounter for screening for malignant neoplasm of colon: Secondary | ICD-10-CM

## 2023-01-25 DIAGNOSIS — Z79899 Other long term (current) drug therapy: Secondary | ICD-10-CM

## 2023-01-25 MED ORDER — HYDROCHLOROTHIAZIDE 25 MG PO TABS: 25.0000 mg | ORAL_TABLET | Freq: Every day | ORAL | 3 refills | Status: DC

## 2023-01-25 MED ORDER — POTASSIUM CHLORIDE ER 10 MEQ PO TBCR: 10.0000 meq | EXTENDED_RELEASE_TABLET | Freq: Every day | ORAL | 3 refills | Status: DC

## 2023-01-25 MED ORDER — LOVASTATIN 20 MG PO TABS: 20.0000 mg | ORAL_TABLET | ORAL | 3 refills | Status: DC

## 2023-01-25 NOTE — Patient Instructions (Addendum)
Keep exercising  Add some strength training to your routine, this is important for bone and brain health and can reduce your risk of falls and help your body use insulin properly and regulate weight  Light weights, exercise bands , and internet videos are a good way to start  Yoga (chair or regular), machines , floor exercises or a gym with machines are also good options     Keep taking good care of yourself Wear sun protection   Try to get most of your carbohydrates from produce (with the exception of white potatoes)  Eat less bread/pasta/rice/snack foods/cereals/sweets and other items from the middle of the grocery store (processed carbs)

## 2023-01-25 NOTE — Assessment & Plan Note (Signed)
Reviewed health habits including diet and exercise and skin cancer prevention Reviewed appropriate screening tests for age  Also reviewed health mt list, fam hx and immunization status , as well as social and family history   See HPI Labs reviewed and ordered Neg cologaurd 06/2021 Mammogram utd 07/2022 Dexa 10/2020- scheduled / no falls or fractures PHQ score of 0 Utd dermatology care

## 2023-01-25 NOTE — Assessment & Plan Note (Signed)
Lab Results  Component Value Date   HGBA1C 6.0 01/18/2023   disc imp of low glycemic diet and wt loss to prevent DM2

## 2023-01-25 NOTE — Assessment & Plan Note (Signed)
On a drug holiday from alendrnate - she declined a re start yet  No falls or fs Taking ca and D Encouraged strength building exercise

## 2023-01-25 NOTE — Assessment & Plan Note (Signed)
Continues omeprazole 20 mg daily  Encouraged to watch diet

## 2023-01-25 NOTE — Assessment & Plan Note (Signed)
Cologuard neg 06/2021

## 2023-01-25 NOTE — Assessment & Plan Note (Signed)
Will check B12 next time  

## 2023-01-25 NOTE — Progress Notes (Signed)
Subjective:    Patient ID: Brianna Espinoza, female    DOB: 01-31-1951, 72 y.o.   MRN: [id number]  HPI  Here for health maintenance exam and to review chronic medical problems   Wt Readings from Last 3 Encounters:  01/25/23 159 lb (72.1 kg)  01/07/23 157 lb (71.2 kg)  03/10/22 163 lb (73.9 kg)   31.31 kg/m  Vitals:   01/25/23 1354 01/25/23 1422  BP: (!) 148/80 121/80  Pulse: 68   Temp: 97.8 F (36.6 C)   SpO2: 95%    Has been feeling great Really busy Some travel  Remodeling house also / doing a lot themselves     Immunization History  Administered Date(s) Administered   Influenza Split 04/29/2011, 07/20/2012   Influenza,inj,Quad PF,6+ Mos 05/30/2014, 05/24/2015, 06/02/2016, 06/08/2017, 06/09/2018, 04/25/2019, 04/18/2020   Influenza-Unspecified 05/25/2013, 05/11/2022   PFIZER(Purple Top)SARS-COV-2 Vaccination 09/12/2019, 10/03/2019, 05/15/2020   Pneumococcal Conjugate-13 11/16/2014   Pneumococcal Polysaccharide-23 11/19/2015   Zoster, Live 02/21/2014    Health Maintenance Due  Topic Date Due   Colonoscopy  06/21/2021   Colon cancer screening-colonoscopy 06/2011  Cologuard neg 06/2021   Mammogram 07/2022 -has next appt set  Self breast exam-no lumps   Gyn care/ pap    Dexa  10/2020 -osteopenia  Drug holiday from alendronate- declined a re start yet  Falls-none Fractures-none  Supplements - ca and D  Exercise - fair amount  Shoveling/outdoor work and some golf  Has 2 lb and 5 lb weights also  Lot of walking   More arthritis in hands with time    Mood    01/25/2023    1:58 PM 01/07/2023   10:36 AM 03/10/2022   10:23 AM 01/05/2022   11:08 AM 01/03/2021   11:24 AM  Depression screen PHQ 2/9  Decreased Interest 0 0 0 0 0  Down, Depressed, Hopeless 0 0 0 0 0  PHQ - 2 Score 0 0 0 0 0  Altered sleeping 0   0 0  Tired, decreased energy 0   0 0  Change in appetite 0   0 0  Feeling bad or failure about yourself  0   0 0  Trouble concentrating 0   0 0   Moving slowly or fidgety/restless 0   0 0  Suicidal thoughts 0   0 0  PHQ-9 Score 0   0 0  Difficult doing work/chores Not difficult at all   Not difficult at all Not difficult at all   Sees derm regularly  Had a squamous cell lesion right hand     HTN bp is stable today  No cp or palpitations or headaches or edema  No side effects to medicines  BP Readings from Last 3 Encounters:  01/25/23 121/80  03/10/22 132/80  01/13/22 124/72    Hydrochlorothiazide 25 mg daily   No problems at home  Is always very good   Lab Results  Component Value Date   NA 139 01/18/2023   K 4.1 01/18/2023   CO2 29 01/18/2023   GLUCOSE 103 (H) 01/18/2023   BUN 21 01/18/2023   CREATININE 1.00 01/18/2023   CALCIUM 9.3 01/18/2023   GFR 55.57 (L) 01/18/2023   Good water intake   Klor con 10 meq daily  Lab Results  Component Value Date   ALT 12 01/18/2023   AST 18 01/18/2023   ALKPHOS 67 01/18/2023   BILITOT 0.5 01/18/2023     GERD Omeprazole 20 mg daily  Working well and  watching her diet     Hyperlipidemia Lab Results  Component Value Date   CHOL 168 01/18/2023   CHOL 156 01/06/2022   CHOL 186 01/06/2021   Lab Results  Component Value Date   HDL 48.20 01/18/2023   HDL 48.80 01/06/2022   HDL 50.40 01/06/2021   Lab Results  Component Value Date   LDLCALC 107 (H) 01/18/2023   LDLCALC 96 01/06/2022   LDLCALC 120 (H) 01/06/2021   Lab Results  Component Value Date   TRIG 61.0 01/18/2023   TRIG 58.0 01/06/2022   TRIG 78.0 01/06/2021   Lab Results  Component Value Date   CHOLHDL 3 01/18/2023   CHOLHDL 3 01/06/2022   CHOLHDL 4 01/06/2021   No results found for: "LDLDIRECT"  Lovastatin 20 mg every other day  Stable   Eats healthy A lot from mediterranean diet    Prediabetes Lab Results  Component Value Date   HGBA1C 6.0 01/18/2023   Stable   Watches out for sugar   Lab Results  Component Value Date   WBC 7.9 01/18/2023   HGB 13.7 01/18/2023   HCT  41.2 01/18/2023   MCV 92.1 01/18/2023   PLT 283.0 01/18/2023   Lab Results  Component Value Date   TSH 1.53 01/18/2023      Patient Active Problem List   Diagnosis Date Noted   Current use of proton pump inhibitor 01/25/2023   Multiple renal cysts 03/17/2022   Hypokalemia 01/13/2022   Colon cancer screening 06/02/2021   Grief reaction 12/13/2015   Routine general medical examination at a health care facility 11/19/2015   Estrogen deficiency 04/01/2015   Encounter for Medicare annual wellness exam 11/16/2014   Prediabetes 11/16/2014   History of cerebral infarction 10/03/2014   Essential hypertension 10/03/2014   Obesity 09/21/2011   Osteopenia 03/12/2009   Hyperlipidemia 12/13/2006   ALLERGIC RHINITIS 12/13/2006   GERD 12/13/2006   OSTEOARTHRITIS 12/13/2006   Past Medical History:  Diagnosis Date   Allergy    allergic rhinitis   Anxiety    Son passed/2017   Arthritis    OA   Depression    Grief/son passed 11/27/2015   Fibrocystic breast    GERD (gastroesophageal reflux disease)    Hyperlipidemia    Obesity    Osteopenia    Stroke (HCC)    cerebral infarction   Tibia fracture 08/03/2010   playing golf    Past Surgical History:  Procedure Laterality Date   BREAST CYST ASPIRATION  1980s   aspirated breast lump   COLONOSCOPY  08/03/2010   ESOPHAGOGASTRODUODENOSCOPY ENDOSCOPY  09/18/2014   INGUINAL HERNIA REPAIR Right 08/03/1954   TUBAL LIGATION  08/03/1974   lap   Social History   Tobacco Use   Smoking status: Never   Smokeless tobacco: Never  Vaping Use   Vaping Use: Never used  Substance Use Topics   Alcohol use: Yes    Comment: Occasional wine   Drug use: No   Family History  Problem Relation Age of Onset   Arthritis Mother    Hyperlipidemia Mother    Hypertension Mother    COPD Mother    Asthma Mother    Stroke Father    Heart disease Father    Pancreatic cancer Maternal Grandfather    Heart disease Maternal Grandfather    Arthritis  Maternal Grandmother    Hypertension Maternal Grandmother    Stroke Maternal Grandmother    Colon cancer Neg Hx    Esophageal cancer Neg Hx  Rectal cancer Neg Hx    Stomach cancer Neg Hx    Breast cancer Neg Hx    Allergies  Allergen Reactions   Buspar [Buspirone]     nausea   Lipitor [Atorvastatin] Other (See Comments)    Muscle pain    Sulfa Antibiotics    Tetanus Toxoid Hives   Wellbutrin [Bupropion] Hives   Acetaminophen Rash   Septra [Sulfamethoxazole-Trimethoprim] Rash    Body rash   Current Outpatient Medications on File Prior to Visit  Medication Sig Dispense Refill   aspirin EC 325 MG tablet Take 1 tablet (325 mg total) by mouth daily. 90 tablet 3   Calcium Carbonate-Vit D-Min 600-400 MG-UNIT TABS Take 2 tablets by mouth daily.     fexofenadine (ALLEGRA) 180 MG tablet Take 180 mg by mouth daily as needed (during allergy season).      fluticasone (FLONASE) 50 MCG/ACT nasal spray USE 2 SPRAYS INTO EACH NOSTRIL ONCE DAILY AS NEEDED ALLERGIES/RHINITIS 48 g 0   Multiple Vitamin (MULTIVITAMIN) tablet Take 1 tablet by mouth daily.     omeprazole (PRILOSEC) 20 MG capsule Take 1 capsule (20 mg total) by mouth daily. 90 capsule 3   Probiotic Product (ALIGN) 4 MG CAPS Take by mouth.     No current facility-administered medications on file prior to visit.    Review of Systems  Constitutional:  Negative for activity change, appetite change, fatigue, fever and unexpected weight change.  HENT:  Negative for congestion, ear pain, rhinorrhea, sinus pressure and sore throat.   Eyes:  Negative for pain, redness and visual disturbance.  Respiratory:  Negative for cough, shortness of breath and wheezing.   Cardiovascular:  Negative for chest pain and palpitations.  Gastrointestinal:  Negative for abdominal pain, blood in stool, constipation and diarrhea.  Endocrine: Negative for polydipsia and polyuria.  Genitourinary:  Negative for dysuria, frequency and urgency.  Musculoskeletal:   Positive for arthralgias. Negative for back pain and myalgias.       Hand joints hurt  Skin:  Negative for pallor and rash.  Allergic/Immunologic: Negative for environmental allergies.  Neurological:  Negative for dizziness, syncope and headaches.  Hematological:  Negative for adenopathy. Does not bruise/bleed easily.  Psychiatric/Behavioral:  Negative for decreased concentration and dysphoric mood. The patient is not nervous/anxious.        Objective:   Physical Exam Constitutional:      General: She is not in acute distress.    Appearance: Normal appearance. She is well-developed. She is obese. She is not ill-appearing or diaphoretic.  HENT:     Head: Normocephalic and atraumatic.     Right Ear: Tympanic membrane, ear canal and external ear normal.     Left Ear: Tympanic membrane, ear canal and external ear normal.     Nose: Nose normal. No congestion.     Mouth/Throat:     Mouth: Mucous membranes are moist.     Pharynx: Oropharynx is clear. No posterior oropharyngeal erythema.  Eyes:     General: No scleral icterus.    Extraocular Movements: Extraocular movements intact.     Conjunctiva/sclera: Conjunctivae normal.     Pupils: Pupils are equal, round, and reactive to light.  Neck:     Thyroid: No thyromegaly.     Vascular: No carotid bruit or JVD.  Cardiovascular:     Rate and Rhythm: Normal rate and regular rhythm.     Pulses: Normal pulses.     Heart sounds: Normal heart sounds.     No  gallop.  Pulmonary:     Effort: Pulmonary effort is normal. No respiratory distress.     Breath sounds: Normal breath sounds. No wheezing.     Comments: Good air exch Chest:     Chest wall: No tenderness.  Abdominal:     General: Bowel sounds are normal. There is no distension or abdominal bruit.     Palpations: Abdomen is soft. There is no mass.     Tenderness: There is no abdominal tenderness.     Hernia: No hernia is present.  Genitourinary:    Comments: Breast exam: No mass,  nodules, thickening, tenderness, bulging, retraction, inflamation, nipple discharge or skin changes noted.  No axillary or clavicular LA.     Musculoskeletal:        General: No tenderness. Normal range of motion.     Cervical back: Normal range of motion and neck supple. No rigidity. No muscular tenderness.     Right lower leg: No edema.     Left lower leg: No edema.     Comments: No kyphosis   Lymphadenopathy:     Cervical: No cervical adenopathy.  Skin:    General: Skin is warm and dry.     Coloration: Skin is not pale.     Findings: No erythema or rash.     Comments: Solar lentigines diffusely   Neurological:     Mental Status: She is alert. Mental status is at baseline.     Cranial Nerves: No cranial nerve deficit.     Motor: No abnormal muscle tone.     Coordination: Coordination normal.     Gait: Gait normal.     Deep Tendon Reflexes: Reflexes are normal and symmetric. Reflexes normal.  Psychiatric:        Mood and Affect: Mood normal.        Cognition and Memory: Cognition and memory normal.           Assessment & Plan:   Problem List Items Addressed This Visit       Cardiovascular and Mediastinum   Essential hypertension    bp in fair control at this time  BP Readings from Last 1 Encounters:  01/25/23 121/80  No changes needed Most recent labs reviewed  Disc lifstyle change with low sodium diet and exercise  hctz 25 mg daily       Relevant Medications   hydrochlorothiazide (HYDRODIURIL) 25 MG tablet   lovastatin (MEVACOR) 20 MG tablet     Digestive   GERD    Continues omeprazole 20 mg daily  Encouraged to watch diet         Musculoskeletal and Integument   Osteopenia    On a drug holiday from alendrnate - she declined a re start yet  No falls or fs Taking ca and D Encouraged strength building exercise         Other   Routine general medical examination at a health care facility - Primary    Reviewed health habits including diet and  exercise and skin cancer prevention Reviewed appropriate screening tests for age  Also reviewed health mt list, fam hx and immunization status , as well as social and family history   See HPI Labs reviewed and ordered Neg cologaurd 06/2021 Mammogram utd 07/2022 Dexa 10/2020- scheduled / no falls or fractures PHQ score of 0 Utd dermatology care       Prediabetes    Lab Results  Component Value Date   HGBA1C 6.0 01/18/2023  disc  imp of low glycemic diet and wt loss to prevent DM2       Obesity    Discussed how this problem influences overall health and the risks it imposes  Reviewed plan for weight loss with lower calorie diet (via better food choices and also portion control or program like weight watchers) and exercise building up to or more than 30 minutes 5 days per week including some aerobic activity        Hypokalemia    Lab Results  Component Value Date   K 4.1 01/18/2023  Continues klor con 10 mew daily      Hyperlipidemia    Disc goals for lipids and reasons to control them Rev last labs with pt Rev low sat fat diet in detail  Plan to continue lovastatin 20 mg every other day  LDL up slt to 107      Relevant Medications   hydrochlorothiazide (HYDRODIURIL) 25 MG tablet   lovastatin (MEVACOR) 20 MG tablet   Current use of proton pump inhibitor    Will check B12 next time      Colon cancer screening    Cologuard neg 06/2021

## 2023-01-25 NOTE — Assessment & Plan Note (Signed)
Disc goals for lipids and reasons to control them Rev last labs with pt Rev low sat fat diet in detail  Plan to continue lovastatin 20 mg every other day  LDL up slt to 107

## 2023-01-25 NOTE — Assessment & Plan Note (Signed)
bp in fair control at this time  BP Readings from Last 1 Encounters:  01/25/23 121/80   No changes needed Most recent labs reviewed  Disc lifstyle change with low sodium diet and exercise  hctz 25 mg daily

## 2023-01-25 NOTE — Assessment & Plan Note (Signed)
Lab Results  Component Value Date   K 4.1 01/18/2023   Continues klor con 10 mew daily

## 2023-01-25 NOTE — Assessment & Plan Note (Signed)
Discussed how this problem influences overall health and the risks it imposes  Reviewed plan for weight loss with lower calorie diet (via better food choices and also portion control or program like weight watchers) and exercise building up to or more than 30 minutes 5 days per week including some aerobic activity    

## 2023-03-08 ENCOUNTER — Other Ambulatory Visit: Payer: Self-pay | Admitting: Family Medicine

## 2023-04-15 ENCOUNTER — Encounter: Payer: Self-pay | Admitting: Family Medicine

## 2023-04-15 ENCOUNTER — Ambulatory Visit (INDEPENDENT_AMBULATORY_CARE_PROVIDER_SITE_OTHER): Payer: Medicare HMO | Admitting: Family Medicine

## 2023-04-15 VITALS — BP 137/77 | HR 87 | Temp 98.3°F | Resp 16 | Ht 59.75 in | Wt 159.2 lb

## 2023-04-15 DIAGNOSIS — H6990 Unspecified Eustachian tube disorder, unspecified ear: Secondary | ICD-10-CM | POA: Insufficient documentation

## 2023-04-15 DIAGNOSIS — H6992 Unspecified Eustachian tube disorder, left ear: Secondary | ICD-10-CM

## 2023-04-15 DIAGNOSIS — I10 Essential (primary) hypertension: Secondary | ICD-10-CM

## 2023-04-15 NOTE — Assessment & Plan Note (Signed)
bp in fair control at this time  BP Readings from Last 1 Encounters:  04/15/23 137/77   No changes needed Most recent labs reviewed  Disc lifstyle change with low sodium diet and exercise  hctz 25 mg daily   Pt will continue to check at home Her cuff was a little higher than ours today Likely some white coat phenomonon

## 2023-04-15 NOTE — Progress Notes (Signed)
Subjective:    Patient ID: Brianna Espinoza, female    DOB: Dec 01, 1950, 72 y.o.   MRN: [id number]  HPI  Wt Readings from Last 3 Encounters:  04/15/23 159 lb 4 oz (72.2 kg)  01/25/23 159 lb (72.1 kg)  01/07/23 157 lb (71.2 kg)   31.36 kg/m  Vitals:   04/15/23 1157 04/15/23 1159  BP: (!) 142/82 137/77  Pulse:    Resp:    Temp:    SpO2:       Pt presents with a popping sensation in ear when talking and drinking  Also HTN    Was seen on 8/25 at [facility] for OM of left ear  Noted TM effusion and redness on exam and also some maxillary sinus tenderness  Notes reviewed Was prescription augmentin bid for 10 d   It did help Finished it last Wednesday   2 d ago started some symptoms  Is holding her hearing aides (has new ones)   Hears and feels clicking - when moving her jaw  Feels stopped up   Some congestion  Runny nose    Over the counter Flonase - not taking  Allegra (held while on antibiotic)  Lots of water    HTN bp is stable today  No cp or palpitations or headaches or edema  No side effects to medicines  BP Readings from Last 3 Encounters:  04/15/23 137/77  01/25/23 121/80  03/10/22 132/80     At home her blood pressure is avg 130s/70s Last 136/77  Lab Results  Component Value Date   NA 139 01/18/2023   K 4.1 01/18/2023   CO2 29 01/18/2023   GLUCOSE 103 (H) 01/18/2023   BUN 21 01/18/2023   CREATININE 1.00 01/18/2023   CALCIUM 9.3 01/18/2023   GFR 55.57 (L) 01/18/2023      Patient Active Problem List   Diagnosis Date Noted   ETD (eustachian tube dysfunction) 04/15/2023   Current use of proton pump inhibitor 01/25/2023   Multiple renal cysts 03/17/2022   Hypokalemia 01/13/2022   Colon cancer screening 06/02/2021   Grief reaction 12/13/2015   Routine general medical examination at a health care facility 11/19/2015   Estrogen deficiency 04/01/2015   Encounter for Medicare annual wellness exam 11/16/2014   Prediabetes 11/16/2014   History  of cerebral infarction 10/03/2014   Essential hypertension 10/03/2014   Obesity 09/21/2011   Osteopenia 03/12/2009   Hyperlipidemia 12/13/2006   ALLERGIC RHINITIS 12/13/2006   GERD 12/13/2006   OSTEOARTHRITIS 12/13/2006   Past Medical History:  Diagnosis Date   Allergy    allergic rhinitis   Anxiety    Son passed/2017   Arthritis    OA   Depression    Grief/son passed 11/27/2015   Fibrocystic breast    GERD (gastroesophageal reflux disease)    Hyperlipidemia    Obesity    Osteopenia    Stroke (HCC)    cerebral infarction   Tibia fracture 08/03/2010   playing golf    Past Surgical History:  Procedure Laterality Date   BREAST CYST ASPIRATION  1980s   aspirated breast lump   COLONOSCOPY  08/03/2010   ESOPHAGOGASTRODUODENOSCOPY ENDOSCOPY  09/18/2014   INGUINAL HERNIA REPAIR Right 08/03/1954   TUBAL LIGATION  08/03/1974   lap   Social History   Tobacco Use   Smoking status: Never   Smokeless tobacco: Never  Vaping Use   Vaping status: Never Used  Substance Use Topics   Alcohol use: Yes  Comment: Occasional wine   Drug use: No   Family History  Problem Relation Age of Onset   Arthritis Mother    Hyperlipidemia Mother    Hypertension Mother    COPD Mother    Asthma Mother    Stroke Father    Heart disease Father    Pancreatic cancer Maternal Grandfather    Heart disease Maternal Grandfather    Arthritis Maternal Grandmother    Hypertension Maternal Grandmother    Stroke Maternal Grandmother    Colon cancer Neg Hx    Esophageal cancer Neg Hx    Rectal cancer Neg Hx    Stomach cancer Neg Hx    Breast cancer Neg Hx    Allergies  Allergen Reactions   Buspar [Buspirone]     nausea   Lipitor [Atorvastatin] Other (See Comments)    Muscle pain    Sulfa Antibiotics    Tetanus Toxoid Hives   Wellbutrin [Bupropion] Hives   Acetaminophen Rash   Septra [Sulfamethoxazole-Trimethoprim] Rash    Body rash   Current Outpatient Medications on File Prior to  Visit  Medication Sig Dispense Refill   aspirin EC 325 MG tablet Take 1 tablet (325 mg total) by mouth daily. 90 tablet 3   Calcium Carbonate-Vit D-Min 600-400 MG-UNIT TABS Take 2 tablets by mouth daily.     fexofenadine (ALLEGRA) 180 MG tablet Take 180 mg by mouth daily as needed (during allergy season).      fluticasone (FLONASE) 50 MCG/ACT nasal spray USE 2 SPRAYS INTO EACH NOSTRIL ONCE DAILY AS NEEDED ALLERGIES/RHINITIS 48 g 0   hydrochlorothiazide (HYDRODIURIL) 25 MG tablet Take 1 tablet (25 mg total) by mouth daily. 90 tablet 3   lovastatin (MEVACOR) 20 MG tablet Take 1 tablet (20 mg total) by mouth every other day. 45 tablet 3   Multiple Vitamin (MULTIVITAMIN) tablet Take 1 tablet by mouth daily.     omeprazole (PRILOSEC) 20 MG capsule TAKE 1 CAPSULE BY MOUTH ONCE DAILY 90 capsule 2   potassium chloride (KLOR-CON 10) 10 MEQ tablet Take 1 tablet (10 mEq total) by mouth daily. 90 tablet 3   Probiotic Product (ALIGN) 4 MG CAPS Take by mouth.     No current facility-administered medications on file prior to visit.    Review of Systems  Constitutional:  Negative for activity change, appetite change, fatigue, fever and unexpected weight change.  HENT:  Positive for hearing loss. Negative for congestion, ear discharge, ear pain, rhinorrhea, sinus pressure and sore throat.        Ear fullness and clicking  Eyes:  Negative for pain, redness and visual disturbance.  Respiratory:  Negative for cough, shortness of breath and wheezing.   Cardiovascular:  Negative for chest pain and palpitations.  Gastrointestinal:  Negative for abdominal pain, blood in stool, constipation and diarrhea.  Endocrine: Negative for polydipsia and polyuria.  Genitourinary:  Negative for dysuria, frequency and urgency.  Musculoskeletal:  Negative for arthralgias, back pain and myalgias.  Skin:  Negative for pallor and rash.  Allergic/Immunologic: Negative for environmental allergies.  Neurological:  Negative for  dizziness, syncope and headaches.  Hematological:  Negative for adenopathy. Does not bruise/bleed easily.  Psychiatric/Behavioral:  Negative for decreased concentration and dysphoric mood. The patient is not nervous/anxious.        Objective:   Physical Exam Constitutional:      General: She is not in acute distress.    Appearance: Normal appearance. She is well-developed. She is obese. She is not ill-appearing  or diaphoretic.  HENT:     Head: Normocephalic and atraumatic.     Right Ear: Tympanic membrane, ear canal and external ear normal.     Left Ear: Ear canal and external ear normal.     Ears:     Comments: Left TM is dull No erythema or bulging  Scant cerumen     Nose:     Comments: Boggy nares     Mouth/Throat:     Mouth: Mucous membranes are moist.     Pharynx: Oropharynx is clear.  Eyes:     Conjunctiva/sclera: Conjunctivae normal.     Pupils: Pupils are equal, round, and reactive to light.  Neck:     Thyroid: No thyromegaly.     Vascular: No carotid bruit or JVD.  Cardiovascular:     Rate and Rhythm: Normal rate and regular rhythm.     Heart sounds: Normal heart sounds.     No gallop.  Pulmonary:     Effort: Pulmonary effort is normal. No respiratory distress.     Breath sounds: Normal breath sounds. No wheezing or rales.  Abdominal:     General: There is no distension or abdominal bruit.     Palpations: Abdomen is soft.  Musculoskeletal:     Cervical back: Normal range of motion and neck supple.     Right lower leg: No edema.     Left lower leg: No edema.  Lymphadenopathy:     Cervical: No cervical adenopathy.  Skin:    General: Skin is warm and dry.     Coloration: Skin is not pale.     Findings: No rash.  Neurological:     Mental Status: She is alert.     Coordination: Coordination normal.     Deep Tendon Reflexes: Reflexes are normal and symmetric. Reflexes normal.  Psychiatric:        Mood and Affect: Mood normal.           Assessment &  Plan:   Problem List Items Addressed This Visit       Cardiovascular and Mediastinum   Essential hypertension    bp in fair control at this time  BP Readings from Last 1 Encounters:  04/15/23 137/77   No changes needed Most recent labs reviewed  Disc lifstyle change with low sodium diet and exercise  hctz 25 mg daily   Pt will continue to check at home Her cuff was a little higher than ours today Likely some white coat phenomonon        Nervous and Auditory   ETD (eustachian tube dysfunction) - Primary    S/p OM (treatment with antibiotic) Infection is better   Will treatment effusion/etd Flonase bid for 3 d  Then daily through allergy season Consider flonase if no improvement  Update if not starting to improve in a week or if worsening  .Call back and Er precautions noted in detail today

## 2023-04-15 NOTE — Assessment & Plan Note (Signed)
S/p OM (treatment with antibiotic) Infection is better   Will treatment effusion/etd Flonase bid for 3 d  Then daily through allergy season Consider flonase if no improvement  Update if not starting to improve in a week or if worsening  .Call back and Er precautions noted in detail today

## 2023-04-15 NOTE — Patient Instructions (Addendum)
Use your flonase nasal spray twice daily for 3 days then return to once daily and continue through the allergy season   Update if not starting to improve in a week or if worsening   If you develop ear pain or fever or any other new symptoms let us know    Keep bringing your cuff to your visits   Continue current medicine

## 2023-05-03 ENCOUNTER — Encounter: Payer: Self-pay | Admitting: Family Medicine

## 2023-05-03 MED ORDER — PREDNISONE 10 MG PO TABS
ORAL_TABLET | ORAL | 0 refills | Status: DC
Start: 2023-05-03 — End: 2023-06-10

## 2023-05-31 ENCOUNTER — Telehealth: Payer: Self-pay

## 2023-05-31 NOTE — Transitions of Care (Post Inpatient/ED Visit) (Signed)
Tamara at front desk said pt cb and I was with another pt. I tried to call pt back and left v/m since I could not reach pt on phone. Will try again another time.      05/31/2023  Name: Brianna Espinoza MRN: [id number] DOB: [birth year]  Today's TOC FU Call Status: Today's TOC FU Call Status:: Unsuccessful Call (2nd Attempt) Unsuccessful Call (1st Attempt) Date: 05/31/23 Unsuccessful Call (2nd Attempt) Date: 05/31/23  Attempted to reach the patient regarding the most recent Inpatient/ED visit.  Follow Up Plan: Additional outreach attempts will be made to reach the patient to complete the Transitions of Care (Post Inpatient/ED visit) call.   Signature Lewanda Rife, LPN

## 2023-05-31 NOTE — Transitions of Care (Post Inpatient/ED Visit) (Signed)
Unable to reach patient by phone and left v/m requesting call back at [phone].        05/31/2023  Name: Brianna Espinoza MRN: [id number] DOB: October 02, 1950  Today's TOC FU Call Status: Today's TOC FU Call Status:: Unsuccessful Call (1st Attempt) Unsuccessful Call (1st Attempt) Date: 05/31/23  Attempted to reach the patient regarding the most recent Inpatient/ED visit.  Follow Up Plan: Additional outreach attempts will be made to reach the patient to complete the Transitions of Care (Post Inpatient/ED visit) call.   Signature Lewanda Rife, LPN

## 2023-06-01 NOTE — Transitions of Care (Post Inpatient/ED Visit) (Signed)
pt was on vacation seen [facility] in [place] on 05/27/23 and pt was having burning upon urination with blood in urine.. pt was given abx and has 3 more days of abx and pt is feeling alot better and no blood in urine. Pt is taking cephalexin 500 mg tid and cranberry tablets. Pt said she feels so much better and when pt finishes abx if still any symptoms pt will call [facility] for appt. Sending note to Dr Milinda Antis.     06/01/2023  Name: Brianna Espinoza MRN: [id number] DOB: Sep 20, 1950  Today's TOC FU Call Status: Today's TOC FU Call Status:: Successful TOC FU Call Completed Unsuccessful Call (1st Attempt) Date: 05/31/23 Unsuccessful Call (2nd Attempt) Date: 05/31/23 [facility] FU Call Complete Date: 06/01/23 Patient's Name and Date of Birth confirmed.  Transition Care Management Follow-up Telephone Call Date of Discharge: 05/27/23 Discharge Facility: Other ([facility]) Name of Other ([facility]) Discharge Facility: [facility] [place] Type of Discharge: Emergency Department Reason for ED Visit: Other: (pt was on vacation seen [facility] on 05/27/23 and pt was having burning upon urination with blood in urine.. pt was given abx and has 2 more days of abx and pt is feeling alot better and no blood in urine.) How have you been since you were released from the hospital?: Better Any questions or concerns?: No  Items Reviewed: Did you receive and understand the discharge instructions provided?: Yes Medications obtained,verified, and reconciled?: Partial Review Completed Reason for Partial Mediation Review: pt gave me what ED gave pt cephalexin 500mg  taking tid; pt has 3 more days of med to take and pt is taking cranberry tablests also. Any new allergies since your discharge?: No Dietary orders reviewed?: NA Do you have support at home?: Yes People in Home: spouse Name of Support/Comfort Primary Source: Cliff  Medications Reviewed Today: Medications Reviewed Today    Medications were not reviewed in this encounter     Home Care and Equipment/Supplies: Were Home Health Services Ordered?: NA Any new equipment or medical supplies ordered?: NA  Functional Questionnaire: Do you need assistance with bathing/showering or dressing?: No Do you need assistance with meal preparation?: No Do you need assistance with eating?: No Do you have difficulty maintaining continence: No Do you need assistance with getting out of bed/getting out of a chair/moving?: No Do you have difficulty managing or taking your medications?: No  Follow up appointments reviewed: PCP Follow-up appointment confirmed?: NA Specialist Hospital Follow-up appointment confirmed?: NA Do you need transportation to your follow-up appointment?: No Do you understand care options if your condition(s) worsen?: Yes-patient verbalized understanding    SIGNATURE Lewanda Rife, LPN

## 2023-06-10 ENCOUNTER — Encounter: Payer: Self-pay | Admitting: *Deleted

## 2023-06-10 ENCOUNTER — Encounter: Payer: Self-pay | Admitting: Family Medicine

## 2023-06-10 ENCOUNTER — Ambulatory Visit: Payer: Medicare HMO | Admitting: Family Medicine

## 2023-06-10 VITALS — BP 120/62 | HR 88 | Temp 98.9°F | Ht 59.75 in | Wt 163.0 lb

## 2023-06-10 DIAGNOSIS — H60502 Unspecified acute noninfective otitis externa, left ear: Secondary | ICD-10-CM | POA: Diagnosis not present

## 2023-06-10 DIAGNOSIS — H7392 Unspecified disorder of tympanic membrane, left ear: Secondary | ICD-10-CM | POA: Diagnosis not present

## 2023-06-10 DIAGNOSIS — H9312 Tinnitus, left ear: Secondary | ICD-10-CM | POA: Diagnosis not present

## 2023-06-10 MED ORDER — HYDROCORTISONE-ACETIC ACID 1-2 % OT SOLN: 4.0000 [drp] | Freq: Two times a day (BID) | OTIC | 0 refills | Status: DC

## 2023-06-10 NOTE — Assessment & Plan Note (Signed)
Left ear canal with slight erythema dryness and flaky skin in ear canal.  Symptoms may be secondary to noninfectious otitis externa Treat with combination topical steroid acetic acid drops.

## 2023-06-10 NOTE — Assessment & Plan Note (Signed)
Eustachian tube dysfunction appears resolved.  No current signs of infection.

## 2023-06-10 NOTE — Progress Notes (Signed)
Patient ID: Brianna Espinoza, female    DOB: February 12, 1951, 72 y.o.   MRN: [id number]  This visit was conducted in person.  BP 120/62 (BP Location: Right Arm, Patient Position: Sitting, Cuff Size: Large)   Pulse 88   Temp 98.9 F (37.2 C) (Temporal)   Ht 4' 11.75" (1.518 m)   Wt 163 lb (73.9 kg)   SpO2 98%   BMI 32.10 kg/m    CC:  Chief Complaint  Patient presents with   Ear Pain    Left    Subjective:   HPI: Brianna Espinoza is a 72 y.o. female presenting on 06/10/2023 for Ear Pain (Left)   She has had recent middle ear infection 03/2023 s/p amox,  9/12  PCP saw ETD, used flonase... no better.. treated with prednisone taper.  Treated  10/11 Augmentin for sinus infection.   Treated with cephalexin 500 mg TID x 7 days for urinary tract infection   Symptoms resolved until last night.. crackling and drumming in left  returned.  No pain.   Wears hearing aids.        Relevant past medical, surgical, family and social history reviewed and updated as indicated. Interim medical history since our last visit reviewed. Allergies and medications reviewed and updated. Outpatient Medications Prior to Visit  Medication Sig Dispense Refill   aspirin EC 325 MG tablet Take 1 tablet (325 mg total) by mouth daily. 90 tablet 3   Calcium Carbonate-Vit D-Min 600-400 MG-UNIT TABS Take 2 tablets by mouth daily.     Cranberry 500 MG TABS Take 1 tablet by mouth daily.     fexofenadine (ALLEGRA) 180 MG tablet Take 180 mg by mouth daily as needed (during allergy season).      fluticasone (FLONASE) 50 MCG/ACT nasal spray USE 2 SPRAYS INTO EACH NOSTRIL ONCE DAILY AS NEEDED ALLERGIES/RHINITIS 48 g 0   hydrochlorothiazide (HYDRODIURIL) 25 MG tablet Take 1 tablet (25 mg total) by mouth daily. 90 tablet 3   lovastatin (MEVACOR) 20 MG tablet Take 1 tablet (20 mg total) by mouth every other day. 45 tablet 3   Multiple Vitamin (MULTIVITAMIN) tablet Take 1 tablet by mouth daily.     omeprazole (PRILOSEC) 20  MG capsule TAKE 1 CAPSULE BY MOUTH ONCE DAILY 90 capsule 2   potassium chloride (KLOR-CON 10) 10 MEQ tablet Take 1 tablet (10 mEq total) by mouth daily. 90 tablet 3   Probiotic Product (ALIGN) 4 MG CAPS Take by mouth.     predniSONE (DELTASONE) 10 MG tablet Take 4 pills once daily by mouth for 3 days, then 3 pills daily for 3 days, then 2 pills daily for 3 days then 1 pill daily for 3 days then stop 30 tablet 0   No facility-administered medications prior to visit.     Per HPI unless specifically indicated in ROS section below Review of Systems  Constitutional:  Negative for fatigue and fever.  HENT:  Negative for ear pain.   Eyes:  Negative for pain.  Respiratory:  Negative for chest tightness and shortness of breath.   Cardiovascular:  Negative for chest pain, palpitations and leg swelling.  Gastrointestinal:  Negative for abdominal pain.  Genitourinary:  Negative for dysuria.   Objective:  BP 120/62 (BP Location: Right Arm, Patient Position: Sitting, Cuff Size: Large)   Pulse 88   Temp 98.9 F (37.2 C) (Temporal)   Ht 4' 11.75" (1.518 m)   Wt 163 lb (73.9 kg)   SpO2 98%  BMI 32.10 kg/m   Wt Readings from Last 3 Encounters:  06/10/23 163 lb (73.9 kg)  04/15/23 159 lb 4 oz (72.2 kg)  01/25/23 159 lb (72.1 kg)      Physical Exam Constitutional:      General: She is not in acute distress.    Appearance: Normal appearance. She is well-developed. She is not ill-appearing or toxic-appearing.  HENT:     Head: Normocephalic.     Right Ear: Hearing, tympanic membrane, ear canal and external ear normal. No middle ear effusion. Tympanic membrane is not erythematous, retracted or bulging.     Left Ear: Hearing, tympanic membrane, ear canal and external ear normal.  No middle ear effusion. Tympanic membrane is not erythematous, retracted or bulging.     Ears:      Comments: Small yellow white mass noted on tympanic membrane, possible cholesteatoma    Nose: No mucosal edema or  rhinorrhea.     Right Sinus: No maxillary sinus tenderness or frontal sinus tenderness.     Left Sinus: No maxillary sinus tenderness or frontal sinus tenderness.     Mouth/Throat:     Mouth: Oropharynx is clear and moist and mucous membranes are normal.     Pharynx: Uvula midline.  Eyes:     General: Lids are normal. Lids are everted, no foreign bodies appreciated.     Extraocular Movements: EOM normal.     Conjunctiva/sclera: Conjunctivae normal.     Pupils: Pupils are equal, round, and reactive to light.  Neck:     Thyroid: No thyroid mass or thyromegaly.     Vascular: No carotid bruit.     Trachea: Trachea normal.  Cardiovascular:     Rate and Rhythm: Normal rate and regular rhythm.     Pulses: Normal pulses.     Heart sounds: Normal heart sounds, S1 normal and S2 normal. No murmur heard.    No friction rub. No gallop.  Pulmonary:     Effort: Pulmonary effort is normal. No tachypnea or respiratory distress.     Breath sounds: Normal breath sounds. No decreased breath sounds, wheezing, rhonchi or rales.  Abdominal:     General: Bowel sounds are normal.     Palpations: Abdomen is soft.     Tenderness: There is no abdominal tenderness.  Musculoskeletal:     Cervical back: Normal range of motion and neck supple.  Skin:    General: Skin is warm, dry and intact.     Findings: No rash.  Neurological:     Mental Status: She is alert.  Psychiatric:        Mood and Affect: Mood is not anxious or depressed.        Speech: Speech normal.        Behavior: Behavior normal. Behavior is cooperative.        Thought Content: Thought content normal.        Cognition and Memory: Cognition and memory normal.        Judgment: Judgment normal.       Results for orders placed or performed in visit on 01/18/23  Hemoglobin A1c  Result Value Ref Range   Hgb A1c MFr Bld 6.0 4.6 - 6.5 %  CBC with Differential/Platelet  Result Value Ref Range   WBC 7.9 4.0 - 10.5 K/uL   RBC 4.48 3.87 - 5.11  Mil/uL   Hemoglobin 13.7 12.0 - 15.0 g/dL   HCT 86.5 78.4 - 69.6 %   MCV 92.1 78.0 -  100.0 fl   MCHC 33.2 30.0 - 36.0 g/dL   RDW 62.9 52.8 - 41.3 %   Platelets 283.0 150.0 - 400.0 K/uL   Neutrophils Relative % 56.9 43.0 - 77.0 %   Lymphocytes Relative 32.5 12.0 - 46.0 %   Monocytes Relative 7.0 3.0 - 12.0 %   Eosinophils Relative 2.5 0.0 - 5.0 %   Basophils Relative 1.1 0.0 - 3.0 %   Neutro Abs 4.5 1.4 - 7.7 K/uL   Lymphs Abs 2.6 0.7 - 4.0 K/uL   Monocytes Absolute 0.6 0.1 - 1.0 K/uL   Eosinophils Absolute 0.2 0.0 - 0.7 K/uL   Basophils Absolute 0.1 0.0 - 0.1 K/uL  Comprehensive metabolic panel  Result Value Ref Range   Sodium 139 135 - 145 mEq/L   Potassium 4.1 3.5 - 5.1 mEq/L   Chloride 101 96 - 112 mEq/L   CO2 29 19 - 32 mEq/L   Glucose, Bld 103 (H) 70 - 99 mg/dL   BUN 21 6 - 23 mg/dL   Creatinine, Ser 2.44 0.40 - 1.20 mg/dL   Total Bilirubin 0.5 0.2 - 1.2 mg/dL   Alkaline Phosphatase 67 39 - 117 U/L   AST 18 0 - 37 U/L   ALT 12 0 - 35 U/L   Total Protein 7.0 6.0 - 8.3 g/dL   Albumin 4.1 3.5 - 5.2 g/dL   GFR 01.02 (L) >72.53 mL/min   Calcium 9.3 8.4 - 10.5 mg/dL  Lipid panel  Result Value Ref Range   Cholesterol 168 0 - 200 mg/dL   Triglycerides 66.4 0.0 - 149.0 mg/dL   HDL 40.34 >74.25 mg/dL   VLDL 95.6 0.0 - 38.7 mg/dL   LDL Cholesterol 564 (H) 0 - 99 mg/dL   Total CHOL/HDL Ratio 3    NonHDL 119.56   TSH  Result Value Ref Range   TSH 1.53 0.35 - 5.50 uIU/mL    Assessment and Plan  Tympanic membrane disorder, left Assessment & Plan: Acute, abnormal appearance of left tympanic membrane.  Concern for cholesteatoma versus debris on tympanic membrane.  Orders: -     Ambulatory referral to ENT  Acute noninfective otitis externa of left ear, unspecified type Assessment & Plan: Left ear canal with slight erythema dryness and flaky skin in ear canal.  Symptoms may be secondary to noninfectious otitis externa Treat with combination topical steroid acetic acid  drops.   Tinnitus of left ear Assessment & Plan: Eustachian tube dysfunction appears resolved.  No current signs of infection.  Orders: -     Ambulatory referral to ENT  Other orders -     Hydrocortisone-Acetic Acid; Place 4 drops into the left ear 2 (two) times daily.  Dispense: 10 mL; Refill: 0    No follow-ups on file.   Kerby Nora, MD

## 2023-06-10 NOTE — Assessment & Plan Note (Signed)
Acute, abnormal appearance of left tympanic membrane.  Concern for cholesteatoma versus debris on tympanic membrane.

## 2023-06-30 ENCOUNTER — Other Ambulatory Visit: Payer: Self-pay | Admitting: Family Medicine

## 2023-07-09 ENCOUNTER — Ambulatory Visit
Admission: RE | Admit: 2023-07-09 | Discharge: 2023-07-09 | Disposition: A | Discharge status: Home / Self Care | Payer: Medicare HMO | Source: Ambulatory Visit | Attending: Family Medicine | Admitting: Family Medicine

## 2023-07-09 DIAGNOSIS — Z1231 Encounter for screening mammogram for malignant neoplasm of breast: Secondary | ICD-10-CM

## 2023-07-14 ENCOUNTER — Other Ambulatory Visit: Payer: Self-pay | Admitting: Family Medicine

## 2023-07-14 DIAGNOSIS — R928 Other abnormal and inconclusive findings on diagnostic imaging of breast: Secondary | ICD-10-CM

## 2023-07-24 NOTE — Telephone Encounter (Signed)
Pt seen by ENT 07/14/2023 - Geanie Logan, MD

## 2023-08-02 ENCOUNTER — Ambulatory Visit
Admission: RE | Admit: 2023-08-02 | Discharge: 2023-08-02 | Disposition: A | Discharge status: Home / Self Care | Payer: Medicare HMO | Source: Ambulatory Visit | Attending: Family Medicine | Admitting: Family Medicine

## 2023-08-02 ENCOUNTER — Encounter: Payer: Self-pay | Admitting: Family Medicine

## 2023-08-02 DIAGNOSIS — Z780 Asymptomatic menopausal state: Secondary | ICD-10-CM

## 2023-08-06 MED ORDER — ALENDRONATE SODIUM 70 MG PO TABS: 70.0000 mg | ORAL_TABLET | ORAL | 3 refills | Status: DC

## 2023-08-12 ENCOUNTER — Ambulatory Visit
Admission: RE | Admit: 2023-08-12 | Discharge: 2023-08-12 | Disposition: A | Discharge status: Home / Self Care | Payer: Medicare HMO | Source: Ambulatory Visit | Attending: Family Medicine | Admitting: Family Medicine

## 2023-08-12 DIAGNOSIS — R928 Other abnormal and inconclusive findings on diagnostic imaging of breast: Secondary | ICD-10-CM

## 2023-11-09 ENCOUNTER — Other Ambulatory Visit: Payer: Self-pay | Admitting: Family Medicine

## 2023-11-22 ENCOUNTER — Other Ambulatory Visit: Payer: Self-pay | Admitting: Family Medicine

## 2024-01-07 ENCOUNTER — Encounter

## 2024-01-16 ENCOUNTER — Telehealth: Payer: Self-pay | Admitting: Family Medicine

## 2024-01-16 DIAGNOSIS — Z79899 Other long term (current) drug therapy: Secondary | ICD-10-CM

## 2024-01-16 DIAGNOSIS — E66811 Other obesity due to excess calories: Secondary | ICD-10-CM

## 2024-01-16 DIAGNOSIS — R7303 Prediabetes: Secondary | ICD-10-CM

## 2024-01-16 DIAGNOSIS — I10 Essential (primary) hypertension: Secondary | ICD-10-CM

## 2024-01-16 DIAGNOSIS — E7800 Pure hypercholesterolemia, unspecified: Secondary | ICD-10-CM

## 2024-01-16 NOTE — Telephone Encounter (Signed)
-----   Message from Gerry Krone sent at 01/10/2024 10:06 AM EDT ----- Regarding: Lab orders for [facility], 6.19.25 Patient is scheduled for CPX labs, please order future labs, Thanks , Anselmo Kings

## 2024-01-20 ENCOUNTER — Ambulatory Visit: Payer: Self-pay | Admitting: Family Medicine

## 2024-01-20 ENCOUNTER — Other Ambulatory Visit (INDEPENDENT_AMBULATORY_CARE_PROVIDER_SITE_OTHER)

## 2024-01-20 DIAGNOSIS — E7800 Pure hypercholesterolemia, unspecified: Secondary | ICD-10-CM | POA: Diagnosis not present

## 2024-01-20 DIAGNOSIS — R7303 Prediabetes: Secondary | ICD-10-CM | POA: Diagnosis not present

## 2024-01-20 DIAGNOSIS — I10 Essential (primary) hypertension: Secondary | ICD-10-CM | POA: Diagnosis not present

## 2024-01-20 DIAGNOSIS — Z79899 Other long term (current) drug therapy: Secondary | ICD-10-CM

## 2024-01-20 LAB — CBC WITH DIFFERENTIAL/PLATELET
Basophils Absolute: 0.1 10*3/uL (ref 0.0–0.1)
Basophils Relative: 0.9 % (ref 0.0–3.0)
Eosinophils Absolute: 0.1 10*3/uL (ref 0.0–0.7)
Eosinophils Relative: 2.2 % (ref 0.0–5.0)
HCT: 41.8 % (ref 36.0–46.0)
Hemoglobin: 14.1 g/dL (ref 12.0–15.0)
Lymphocytes Relative: 31.4 % (ref 12.0–46.0)
Lymphs Abs: 2 10*3/uL (ref 0.7–4.0)
MCHC: 33.7 g/dL (ref 30.0–36.0)
MCV: 87.9 fl (ref 78.0–100.0)
Monocytes Absolute: 0.4 10*3/uL (ref 0.1–1.0)
Monocytes Relative: 6.3 % (ref 3.0–12.0)
Neutro Abs: 3.8 10*3/uL (ref 1.4–7.7)
Neutrophils Relative %: 59.2 % (ref 43.0–77.0)
Platelets: 270 10*3/uL (ref 150.0–400.0)
RBC: 4.76 Mil/uL (ref 3.87–5.11)
RDW: 13.7 % (ref 11.5–15.5)
WBC: 6.4 10*3/uL (ref 4.0–10.5)

## 2024-01-20 LAB — COMPREHENSIVE METABOLIC PANEL WITH GFR
ALT: 13 U/L (ref 0–35)
AST: 18 U/L (ref 0–37)
Albumin: 4.1 g/dL (ref 3.5–5.2)
Alkaline Phosphatase: 52 U/L (ref 39–117)
BUN: 22 mg/dL (ref 6–23)
CO2: 33 meq/L — ABNORMAL HIGH (ref 19–32)
Calcium: 9.1 mg/dL (ref 8.4–10.5)
Chloride: 103 meq/L (ref 96–112)
Creatinine, Ser: 0.91 mg/dL (ref 0.40–1.20)
GFR: 61.79 mL/min (ref >60.00)
Glucose, Bld: 96 mg/dL (ref 70–99)
Potassium: 3.2 meq/L — ABNORMAL LOW (ref 3.5–5.1)
Sodium: 142 meq/L (ref 135–145)
Total Bilirubin: 0.5 mg/dL (ref 0.2–1.2)
Total Protein: 6.6 g/dL (ref 6.0–8.3)

## 2024-01-20 LAB — LIPID PANEL
Cholesterol: 157 mg/dL (ref 0–200)
HDL: 47.8 mg/dL (ref >39.00)
LDL Cholesterol: 98 mg/dL (ref 0–99)
NonHDL: 108.98
Total CHOL/HDL Ratio: 3
Triglycerides: 56 mg/dL (ref 0.0–149.0)
VLDL: 11.2 mg/dL (ref 0.0–40.0)

## 2024-01-20 LAB — HEMOGLOBIN A1C: Hgb A1c MFr Bld: 6 % (ref 4.6–6.5)

## 2024-01-20 LAB — TSH: TSH: 1.65 u[IU]/mL (ref 0.35–5.50)

## 2024-01-20 LAB — VITAMIN B12: Vitamin B-12: 168 pg/mL — ABNORMAL LOW (ref 211–911)

## 2024-01-22 ENCOUNTER — Other Ambulatory Visit: Payer: Self-pay | Admitting: Family Medicine

## 2024-01-27 ENCOUNTER — Encounter: Payer: Self-pay | Admitting: Family Medicine

## 2024-01-27 ENCOUNTER — Ambulatory Visit: Admitting: Family Medicine

## 2024-01-27 VITALS — BP 131/65 | HR 74 | Temp 98.5°F | Ht 59.75 in | Wt 157.4 lb

## 2024-01-27 DIAGNOSIS — E876 Hypokalemia: Secondary | ICD-10-CM

## 2024-01-27 DIAGNOSIS — Z0001 Encounter for general adult medical examination with abnormal findings: Secondary | ICD-10-CM

## 2024-01-27 DIAGNOSIS — Z6830 Body mass index (BMI) 30.0-30.9, adult: Secondary | ICD-10-CM

## 2024-01-27 DIAGNOSIS — E7800 Pure hypercholesterolemia, unspecified: Secondary | ICD-10-CM

## 2024-01-27 DIAGNOSIS — R7303 Prediabetes: Secondary | ICD-10-CM

## 2024-01-27 DIAGNOSIS — Z79899 Other long term (current) drug therapy: Secondary | ICD-10-CM

## 2024-01-27 DIAGNOSIS — M8589 Other specified disorders of bone density and structure, multiple sites: Secondary | ICD-10-CM

## 2024-01-27 DIAGNOSIS — Z1211 Encounter for screening for malignant neoplasm of colon: Secondary | ICD-10-CM

## 2024-01-27 DIAGNOSIS — S99921A Unspecified injury of right foot, initial encounter: Secondary | ICD-10-CM

## 2024-01-27 DIAGNOSIS — Z0000 Encounter for general adult medical examination without abnormal findings: Secondary | ICD-10-CM

## 2024-01-27 DIAGNOSIS — E6609 Other obesity due to excess calories: Secondary | ICD-10-CM

## 2024-01-27 DIAGNOSIS — S99929A Unspecified injury of unspecified foot, initial encounter: Secondary | ICD-10-CM | POA: Insufficient documentation

## 2024-01-27 DIAGNOSIS — E66811 Obesity, class 1: Secondary | ICD-10-CM

## 2024-01-27 DIAGNOSIS — K219 Gastro-esophageal reflux disease without esophagitis: Secondary | ICD-10-CM

## 2024-01-27 DIAGNOSIS — E538 Deficiency of other specified B group vitamins: Secondary | ICD-10-CM

## 2024-01-27 DIAGNOSIS — I10 Essential (primary) hypertension: Secondary | ICD-10-CM | POA: Diagnosis not present

## 2024-01-27 MED ORDER — FAMOTIDINE 20 MG PO TABS: 20.0000 mg | ORAL_TABLET | Freq: Two times a day (BID) | ORAL | 2 refills | Status: AC

## 2024-01-27 MED ORDER — POTASSIUM CHLORIDE ER 10 MEQ PO TBCR: 20.0000 meq | EXTENDED_RELEASE_TABLET | Freq: Every day | ORAL | Status: DC

## 2024-01-27 MED ORDER — CYANOCOBALAMIN 1000 MCG/ML IJ SOLN
1000.0000 ug | Freq: Once | INTRAMUSCULAR | Status: AC
  Administered 2024-01-27: 1000 ug via INTRAMUSCULAR

## 2024-01-27 NOTE — Assessment & Plan Note (Signed)
 Cologuard neg 06/2021  Pt will call in the fall if she wants us  to re order

## 2024-01-27 NOTE — Assessment & Plan Note (Signed)
 bp in fair control at this time  BP Readings from Last 1 Encounters:  01/27/24 131/65   No changes needed Most recent labs reviewed  Disc lifstyle change with low sodium diet and exercise  hctz 25 mg daily   Good readings at home   K is down-wll increase her klor con to 20 meq daily

## 2024-01-27 NOTE — Assessment & Plan Note (Signed)
 Lab Results  Component Value Date   K 3.2 (L) 01/20/2024   Takes hydrochlorothiazide  for blood pressure   Will increase klor con to 20 meq daily  Re check in 2 weeks

## 2024-01-27 NOTE — Assessment & Plan Note (Signed)
 New Lab Results  Component Value Date   VITAMINB12 168 (L) 01/20/2024   Possible from ppi (is changing to H2)   Shot today  Instructed to get B12 1000 mcg over the counter and take daily  Re check 3 mo

## 2024-01-27 NOTE — Assessment & Plan Note (Signed)
 Bruising of right 2nd toe distally after stubbing it  Normal rom  Can bear weight  Unlikely fractured  Will monitor  Ice if needed   Update if not starting to improve in a week or if worsening  Call back and Er precautions noted in detail today

## 2024-01-27 NOTE — Assessment & Plan Note (Signed)
 Stable Lab Results  Component Value Date   HGBA1C 6.0 01/20/2024   HGBA1C 6.0 01/18/2023   HGBA1C 6.0 01/06/2022    disc imp of low glycemic diet and wt loss to prevent DM2

## 2024-01-27 NOTE — Patient Instructions (Addendum)
 Try stopping omeprazole  and change to famotidine (pepcid) 20 mg twice daily  If you have rebound symptoms that last more than 2 weeks let us  know    Your b12 is low  Shot today  Get vitamin B12 over the counter and take 1000 mcg every day  Let's check your level again in 3 months    Stay active Add some strength training to your routine, this is important for bone and brain health and can reduce your risk of falls and help your body use insulin properly and regulate weight  Light weights, exercise bands , and internet videos are a good way to start  Yoga (chair or regular), machines , floor exercises or a gym with machines are also good options    We need to go up on your klor con to 2 pills daily  Re check labs in 2 weeks

## 2024-01-27 NOTE — Assessment & Plan Note (Signed)
 Disc goals for lipids and reasons to control them Rev last labs with pt Rev low sat fat diet in detail  Lovastatin  20 mg daily  Good diet  LDL of 98- improved

## 2024-01-27 NOTE — Assessment & Plan Note (Signed)
 Is back on alendronate  weekly and tolerating better this time   Discussed fall prevention, supplements and exercise for bone density   No falls or fracture

## 2024-01-27 NOTE — Progress Notes (Signed)
 Subjective:    Patient ID: Brianna Espinoza, female    DOB: Mar 25, 1951, 73 y.o.   MRN: [id number]  HPI  Here for health maintenance exam and to review chronic medical problems  Stubbed toe  B12 def Low K  Wt Readings from Last 3 Encounters:  01/27/24 157 lb 6 oz (71.4 kg)  06/10/23 163 lb (73.9 kg)  04/15/23 159 lb 4 oz (72.2 kg)   30.99 kg/m  Vitals:   01/27/24 1055 01/27/24 1135  BP: (!) 146/88 131/65  Pulse: 74   Temp: 98.5 F (36.9 C)   SpO2: 98%     Immunization History  Administered Date(s) Administered   Influenza Split 04/29/2011, 07/20/2012   Influenza,inj,Quad PF,6+ Mos 05/30/2014, 05/24/2015, 06/02/2016, 06/08/2017, 06/09/2018, 04/25/2019, 04/18/2020   Influenza-Unspecified 05/25/2013, 05/11/2022, 04/22/2023   PFIZER(Purple Top)SARS-COV-2 Vaccination 09/12/2019, 10/03/2019, 05/15/2020   Pneumococcal Conjugate-13 11/16/2014   Pneumococcal Polysaccharide-23 11/19/2015   Zoster, Live 02/21/2014    There are no preventive care reminders to display for this patient.  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     Shingrix-declines  Had zostavax   Mammogram 07/2023 -mass/ had a call back and it was fine Self breast exam  - no lumps   Gyn health No problems     Colon cancer screening -neg cologuard 06/2021  May want to do it again   Bone health  Dexa  07/2023  osteopenia  Alendronate  - doing well this time (in past had GI upset)  Pleased with it    Falls-none  Fractures--stubbed her toe right 2nd this week - does not think it is broken /not very painful Supplements - ca and D  Last vitamin D  Lab Results  Component Value Date   VD25OH 53 11/09/2013    Exercise :  Walking  Videos  Lot of painting - large things/ on floor a lot  Also large garden    Mood    01/27/2024   10:58 AM 04/15/2023   11:44 AM 01/25/2023    1:58 PM 01/07/2023   10:36 AM 03/10/2022   10:23 AM  Depression screen PHQ 2/9   Decreased Interest 0 0 0 0 0  Down, Depressed, Hopeless 0 0 0 0 0  PHQ - 2 Score 0 0 0 0 0  Altered sleeping 0 0 0    Tired, decreased energy 0 0 0    Change in appetite 0 0 0    Feeling bad or failure about yourself  0 0 0    Trouble concentrating 0 0 0    Moving slowly or fidgety/restless 0 0 0    Suicidal thoughts 0 0 0    PHQ-9 Score 0 0 0    Difficult doing work/chores Not difficult at all Not difficult at all Not difficult at all     HTN bp is stable today  No cp or palpitations or headaches or edema  No side effects to medicines  BP Readings from Last 3 Encounters:  01/27/24 131/65  06/10/23 120/62  04/15/23 137/77    Hydrochlorothiazide  25 mg daily   Lab Results  Component Value Date   NA 142 01/20/2024   K 3.2 (L) 01/20/2024   CO2 33 (H) 01/20/2024   GLUCOSE 96 01/20/2024   BUN 22 01/20/2024   CREATININE 0.91 01/20/2024   CALCIUM  9.1 01/20/2024   GFR 61.79 01/20/2024   Klor con 10 meq daily for hypokalemia  GERD Omeprazole  20 mg daily   Wants to step down to pepcid   Lab Results  Component Value Date   VITAMINB12 168 (L) 01/20/2024    Hyperlipidemia Lab Results  Component Value Date   CHOL 157 01/20/2024   CHOL 168 01/18/2023   CHOL 156 01/06/2022   Lab Results  Component Value Date   HDL 47.80 01/20/2024   HDL 48.20 01/18/2023   HDL 48.80 01/06/2022   Lab Results  Component Value Date   LDLCALC 98 01/20/2024   LDLCALC 107 (H) 01/18/2023   LDLCALC 96 01/06/2022   Lab Results  Component Value Date   TRIG 56.0 01/20/2024   TRIG 61.0 01/18/2023   TRIG 58.0 01/06/2022   Lab Results  Component Value Date   CHOLHDL 3 01/20/2024   CHOLHDL 3 01/18/2023   CHOLHDL 3 01/06/2022   No results found for: LDLDIRECT  Lovastatin  20 mg daily  Watching diet   Prediabetes Lab Results  Component Value Date   HGBA1C 6.0 01/20/2024   HGBA1C 6.0 01/18/2023   HGBA1C 6.0 01/06/2022    Lab Results  Component Value Date   WBC 6.4  01/20/2024   HGB 14.1 01/20/2024   HCT 41.8 01/20/2024   MCV 87.9 01/20/2024   PLT 270.0 01/20/2024   Lab Results  Component Value Date   TSH 1.65 01/20/2024   Lab Results  Component Value Date   ALT 13 01/20/2024   AST 18 01/20/2024   ALKPHOS 52 01/20/2024   BILITOT 0.5 01/20/2024     Patient Active Problem List   Diagnosis Date Noted   Vitamin B12 deficiency 01/27/2024   Toe injury 01/27/2024   Tympanic membrane disorder, left 06/10/2023   Tinnitus of left ear 06/10/2023   Current use of proton pump inhibitor 01/25/2023   Multiple renal cysts 03/17/2022   Hypokalemia 01/13/2022   Colon cancer screening 06/02/2021   Grief reaction 12/13/2015   Routine general medical examination at a health care facility 11/19/2015   Estrogen deficiency 04/01/2015   Prediabetes 11/16/2014   History of cerebral infarction 10/03/2014   Essential hypertension 10/03/2014   Obesity 09/21/2011   Osteopenia 03/12/2009   Hyperlipidemia 12/13/2006   Allergic rhinitis 12/13/2006   GERD 12/13/2006   Osteoarthritis 12/13/2006   Past Medical History:  Diagnosis Date   Allergy    allergic rhinitis   Anxiety    Son passed/2017   Arthritis    OA   Depression    Grief/son passed 11/27/2015   Fibrocystic breast    GERD (gastroesophageal reflux disease)    Hyperlipidemia    Obesity    Osteopenia    Stroke (HCC)    cerebral infarction   Tibia fracture 08/03/2010   playing golf    Past Surgical History:  Procedure Laterality Date   BREAST CYST ASPIRATION  1980s   aspirated breast lump   COLONOSCOPY  08/03/2010   ESOPHAGOGASTRODUODENOSCOPY ENDOSCOPY  09/18/2014   INGUINAL HERNIA REPAIR Right 08/03/1954   TUBAL LIGATION  08/1974   lap   Social History   Tobacco Use   Smoking status: Never   Smokeless tobacco: Never  Vaping Use   Vaping status: Never Used  Substance Use Topics   Alcohol use: Yes    Alcohol/week: 3.0 standard drinks of alcohol    Types: 3 Glasses of wine per  week    Comment: Occasional wine   Drug use: No   Family History  Problem Relation Age of Onset   Arthritis Mother  Hyperlipidemia Mother    Hypertension Mother    COPD Mother    Asthma Mother    Stroke Father    Heart disease Father    Pancreatic cancer Maternal Grandfather    Heart disease Maternal Grandfather    Arthritis Maternal Grandmother    Hypertension Maternal Grandmother    Stroke Maternal Grandmother    Colon cancer Neg Hx    Esophageal cancer Neg Hx    Rectal cancer Neg Hx    Stomach cancer Neg Hx    Breast cancer Neg Hx    Allergies  Allergen Reactions   Buspar  [Buspirone ]     nausea   Lipitor [Atorvastatin ] Other (See Comments)    Muscle pain    Sulfa Antibiotics    Tetanus Toxoid Hives   Wellbutrin  [Bupropion ] Hives   Acetaminophen Rash   Septra [Sulfamethoxazole-Trimethoprim] Rash    Body rash   Current Outpatient Medications on File Prior to Visit  Medication Sig Dispense Refill   alendronate  (FOSAMAX ) 70 MG tablet Take 1 tablet (70 mg total) by mouth every 7 (seven) days. Take with a full glass of water on an empty stomach. 12 tablet 3   aspirin  EC 325 MG tablet Take 1 tablet (325 mg total) by mouth daily. 90 tablet 3   azelastine (ASTELIN) 0.1 % nasal spray Place 1-2 sprays into both nostrils 2 (two) times daily. Use in each nostril as directed     Calcium  Carbonate-Vit D-Min 600-400 MG-UNIT TABS Take 2 tablets by mouth daily.     Cranberry 500 MG TABS Take 1 tablet by mouth daily.     cyanocobalamin  (VITAMIN B12) 1000 MCG tablet Take 1,000 mcg by mouth daily.     fexofenadine (ALLEGRA) 180 MG tablet Take 180 mg by mouth daily as needed (during allergy season).      fluticasone  (FLONASE ) 50 MCG/ACT nasal spray USE 2 SPRAYS IN EACH NOSTRIL ONCE DAILY AS NEEDED FOR ALLERGIES/RHINTIS 48 g 1   hydrochlorothiazide  (HYDRODIURIL ) 25 MG tablet TAKE 1 TABLET BY MOUTH ONCE DAILY 90 tablet 0   lovastatin  (MEVACOR ) 20 MG tablet Take 1 tablet (20 mg total) by  mouth every other day. 45 tablet 3   Multiple Vitamin (MULTIVITAMIN) tablet Take 1 tablet by mouth daily.     No current facility-administered medications on file prior to visit.    Review of Systems  Constitutional:  Negative for activity change, appetite change, fatigue, fever and unexpected weight change.  HENT:  Negative for congestion, ear pain, rhinorrhea, sinus pressure and sore throat.   Eyes:  Negative for pain, redness and visual disturbance.  Respiratory:  Negative for cough, shortness of breath and wheezing.   Cardiovascular:  Negative for chest pain and palpitations.  Gastrointestinal:  Negative for abdominal pain, blood in stool, constipation and diarrhea.  Endocrine: Negative for polydipsia and polyuria.  Genitourinary:  Negative for dysuria, frequency and urgency.  Musculoskeletal:  Negative for arthralgias, back pain and myalgias.       Injured toe -can still walk on it    Skin:  Negative for pallor and rash.  Allergic/Immunologic: Negative for environmental allergies.  Neurological:  Negative for dizziness, syncope and headaches.  Hematological:  Negative for adenopathy. Does not bruise/bleed easily.  Psychiatric/Behavioral:  Negative for decreased concentration and dysphoric mood. The patient is not nervous/anxious.        Objective:   Physical Exam Constitutional:      General: She is not in acute distress.    Appearance: Normal appearance.  She is well-developed. She is obese. She is not ill-appearing or diaphoretic.  HENT:     Head: Normocephalic and atraumatic.     Right Ear: Tympanic membrane, ear canal and external ear normal.     Left Ear: Tympanic membrane, ear canal and external ear normal.     Nose: Nose normal. No congestion.     Mouth/Throat:     Mouth: Mucous membranes are moist.     Pharynx: Oropharynx is clear. No posterior oropharyngeal erythema.   Eyes:     General: No scleral icterus.    Extraocular Movements: Extraocular movements intact.      Conjunctiva/sclera: Conjunctivae normal.     Pupils: Pupils are equal, round, and reactive to light.   Neck:     Thyroid : No thyromegaly.     Vascular: No carotid bruit or JVD.   Cardiovascular:     Rate and Rhythm: Normal rate and regular rhythm.     Pulses: Normal pulses.     Heart sounds: Normal heart sounds.     No gallop.  Pulmonary:     Effort: Pulmonary effort is normal. No respiratory distress.     Breath sounds: Normal breath sounds. No wheezing.     Comments: Good air exch Chest:     Chest wall: No tenderness.  Abdominal:     General: Bowel sounds are normal. There is no distension or abdominal bruit.     Palpations: Abdomen is soft. There is no mass.     Tenderness: There is no abdominal tenderness.     Hernia: No hernia is present.  Genitourinary:    Comments: Breast exam: No mass, nodules, thickening, tenderness, bulging, retraction, inflamation, nipple discharge or skin changes noted.  No axillary or clavicular LA.      Musculoskeletal:        General: No tenderness. Normal range of motion.     Cervical back: Normal range of motion and neck supple. No rigidity. No muscular tenderness.     Right lower leg: No edema.     Left lower leg: No edema.     Comments: No kyphosis   Normal rom of right 2nd toe  Able to bear weight without problem  Lymphadenopathy:     Cervical: No cervical adenopathy.   Skin:    General: Skin is warm and dry.     Coloration: Skin is not pale.     Findings: No erythema or rash.     Comments: Solar lentigines diffusely  Some scabs on lower legs   Bruising just proximal to right 2nd toe nail with mild swelling    Neurological:     Mental Status: She is alert. Mental status is at baseline.     Cranial Nerves: No cranial nerve deficit.     Motor: No abnormal muscle tone.     Coordination: Coordination normal.     Gait: Gait normal.     Deep Tendon Reflexes: Reflexes are normal and symmetric. Reflexes normal.   Psychiatric:         Mood and Affect: Mood normal.        Cognition and Memory: Cognition and memory normal.           Assessment & Plan:   Problem List Items Addressed This Visit       Cardiovascular and Mediastinum   Essential hypertension   bp in fair control at this time  BP Readings from Last 1 Encounters:  01/27/24 131/65   No changes needed Most  recent labs reviewed  Disc lifstyle change with low sodium diet and exercise  hctz 25 mg daily   Good readings at home   K is down-wll increase her klor con to 20 meq daily         Digestive   GERD   Well controlled and interested in change from ppi to H2 Will stop omeprazole  20 and start pepcid 20 mg bid   Update if any issues or worse symptoms      Relevant Medications   famotidine (PEPCID) 20 MG tablet     Musculoskeletal and Integument   Osteopenia   Is back on alendronate  weekly and tolerating better this time   Discussed fall prevention, supplements and exercise for bone density   No falls or fracture         Other   Vitamin B12 deficiency   New Lab Results  Component Value Date   VITAMINB12 168 (L) 01/20/2024   Possible from ppi (is changing to H2)   Shot today  Instructed to get B12 1000 mcg over the counter and take daily  Re check 3 mo       Relevant Orders   Vitamin B12   Toe injury   Bruising of right 2nd toe distally after stubbing it  Normal rom  Can bear weight  Unlikely fractured  Will monitor  Ice if needed   Update if not starting to improve in a week or if worsening  Call back and Er precautions noted in detail today        Routine general medical examination at a health care facility - Primary   Reviewed health habits including diet and exercise and skin cancer prevention Reviewed appropriate screening tests for age  Also reviewed health mt list, fam hx and immunization status , as well as social and family history   See HPI Labs reviewed and ordered Health Maintenance  Topic  Date Due   COVID-19 Vaccine (4 - 2024-25 season) 02/11/2025*   Zoster (Shingles) Vaccine (1 of 2) 04/28/2025*   Flu Shot  03/03/2024   Cologuard (Stool DNA test)  06/12/2024   Mammogram  07/08/2024   Medicare Annual Wellness Visit  01/26/2025   Pneumococcal Vaccine for age over 57  Completed   DEXA scan (bone density measurement)  Completed   Hepatitis C Screening  Completed   Hepatitis B Vaccine  Aged Out   HPV Vaccine  Aged Out   Meningitis B Vaccine  Aged Out   DTaP/Tdap/Td vaccine  Discontinued   Colon Cancer Screening  Discontinued  *Topic was postponed. The date shown is not the original due date.   Declines shingrix vaccine  May renew cologuard in nov 2025 No falls or fracture  Discussed fall prevention, supplements and exercise for bone density  PHQ 0         Prediabetes   Stable Lab Results  Component Value Date   HGBA1C 6.0 01/20/2024   HGBA1C 6.0 01/18/2023   HGBA1C 6.0 01/06/2022    disc imp of low glycemic diet and wt loss to prevent DM2       Obesity   Discussed how this problem influences overall health and the risks it imposes  Reviewed plan for weight loss with lower calorie diet (via better food choices (lower glycemic and portion control) along with exercise building up to or more than 30 minutes 5 days per week including some aerobic activity and strength training    Encouraged more strength training as tolerated  Hypokalemia   Lab Results  Component Value Date   K 3.2 (L) 01/20/2024   Takes hydrochlorothiazide  for blood pressure   Will increase klor con to 20 meq daily  Re check in 2 weeks       Relevant Orders   Basic metabolic panel with GFR   Hyperlipidemia   Disc goals for lipids and reasons to control them Rev last labs with pt Rev low sat fat diet in detail  Lovastatin  20 mg daily  Good diet  LDL of 98- improved       Current use of proton pump inhibitor   B12 is low Will supplement Pt is interested in change to  H2 Will do this  See a/p for gerd       Colon cancer screening   Cologuard neg 06/2021  Pt will call in the fall if she wants us  to re order

## 2024-01-27 NOTE — Assessment & Plan Note (Signed)
 Discussed how this problem influences overall health and the risks it imposes  Reviewed plan for weight loss with lower calorie diet (via better food choices (lower glycemic and portion control) along with exercise building up to or more than 30 minutes 5 days per week including some aerobic activity and strength training    Encouraged more strength training as tolerated

## 2024-01-27 NOTE — Assessment & Plan Note (Signed)
 Well controlled and interested in change from ppi to H2 Will stop omeprazole  20 and start pepcid 20 mg bid   Update if any issues or worse symptoms

## 2024-01-27 NOTE — Assessment & Plan Note (Signed)
 B12 is low Will supplement Pt is interested in change to H2 Will do this  See a/p for gerd

## 2024-01-27 NOTE — Assessment & Plan Note (Signed)
 Reviewed health habits including diet and exercise and skin cancer prevention Reviewed appropriate screening tests for age  Also reviewed health mt list, fam hx and immunization status , as well as social and family history   See HPI Labs reviewed and ordered Health Maintenance  Topic Date Due   COVID-19 Vaccine (4 - 2024-25 season) 02/11/2025*   Zoster (Shingles) Vaccine (1 of 2) 04/28/2025*   Flu Shot  03/03/2024   Cologuard (Stool DNA test)  06/12/2024   Mammogram  07/08/2024   Medicare Annual Wellness Visit  01/26/2025   Pneumococcal Vaccine for age over 6  Completed   DEXA scan (bone density measurement)  Completed   Hepatitis C Screening  Completed   Hepatitis B Vaccine  Aged Out   HPV Vaccine  Aged Out   Meningitis B Vaccine  Aged Out   DTaP/Tdap/Td vaccine  Discontinued   Colon Cancer Screening  Discontinued  *Topic was postponed. The date shown is not the original due date.   Declines shingrix vaccine  May renew cologuard in nov 2025 No falls or fracture  Discussed fall prevention, supplements and exercise for bone density  PHQ 0

## 2024-02-08 ENCOUNTER — Ambulatory Visit (INDEPENDENT_AMBULATORY_CARE_PROVIDER_SITE_OTHER)

## 2024-02-08 VITALS — Ht 59.75 in | Wt 157.0 lb

## 2024-02-08 DIAGNOSIS — Z0000 Encounter for general adult medical examination without abnormal findings: Secondary | ICD-10-CM

## 2024-02-08 NOTE — Patient Instructions (Signed)
 Ms. Whitmoyer , Thank you for taking time out of your busy schedule to complete your Annual Wellness Visit with me. I enjoyed our conversation and look forward to speaking with you again next year. I, as well as your care team,  appreciate your ongoing commitment to your health goals. Please review the following plan we discussed and let me know if I can assist you in the future. Your Game plan/ To Do List     Follow up Visits: Next Medicare AWV with our clinical staff: 02/08/25 @ 1pm televisit   Have you seen your provider in the last 6 months (3 months if uncontrolled diabetes)? Yes Next Office Visit with your provider: not scheduled yet:due June 2026 for next physical  Clinician Recommendations:  Aim for 30 minutes of exercise or brisk walking, 6-8 glasses of water, and 5 servings of fruits and vegetables each day.       This is a list of the screening recommended for you and due dates:  Health Maintenance  Topic Date Due   COVID-19 Vaccine (4 - 2024-25 season) 02/11/2025*   Zoster (Shingles) Vaccine (1 of 2) 04/28/2025*   Flu Shot  03/03/2024   Cologuard (Stool DNA test)  06/12/2024   Mammogram  07/08/2024   Medicare Annual Wellness Visit  02/07/2025   Pneumococcal Vaccine for age over 62  Completed   DEXA scan (bone density measurement)  Completed   Hepatitis C Screening  Completed   Hepatitis B Vaccine  Aged Out   HPV Vaccine  Aged Out   Meningitis B Vaccine  Aged Out   DTaP/Tdap/Td vaccine  Discontinued   Colon Cancer Screening  Discontinued  *Topic was postponed. The date shown is not the original due date.    Advanced directives: (In Chart) A copy of your advanced directives are scanned into your chart should your provider ever need it. Advance Care Planning is important because it:  [x]  Makes sure you receive the medical care that is consistent with your values, goals, and preferences  [x]  It provides guidance to your family and loved ones and reduces their decisional burden  about whether or not they are making the right decisions based on your wishes.  Follow the link provided in your after visit summary or read over the paperwork we have mailed to you to help you started getting your Advance Directives in place. If you need assistance in completing these, please reach out to us  so that we can help you!

## 2024-02-08 NOTE — Progress Notes (Addendum)
 Please attest and cosign this visit due to patients primary care provider not being in the office at the time the visit was completed.    Subjective:   Brianna Espinoza is a 73 y.o. who presents for a Medicare Wellness preventive visit.  As a reminder, Annual Wellness Visits don't include a physical exam, and some assessments may be limited, especially if this visit is performed virtually. We may recommend an in-person follow-up visit with your provider if needed.  Visit Complete: Virtual I connected with  Brianna Espinoza on 02/08/24 by a audio enabled telemedicine application and verified that I am speaking with the correct person using two identifiers.  Patient Location: Home  Provider Location: Office/Clinic  I discussed the limitations of evaluation and management by telemedicine. The patient expressed understanding and agreed to proceed.  Vital Signs: Because this visit was a virtual/telehealth visit, some criteria may be missing or patient reported. Any vitals not documented were not able to be obtained and vitals that have been documented are patient reported.  VideoDeclined- This patient declined Librarian, academic. Therefore the visit was completed with audio only.  Persons Participating in Visit: Patient.  AWV Questionnaire: Yes: Patient Medicare AWV questionnaire was completed by the patient on 02/01/24; I have confirmed that all information answered by patient is correct and no changes since this date.  Cardiac Risk Factors include: advanced age (>44men, >23 women);dyslipidemia;hypertension;obesity (BMI >30kg/m2)     Objective:    Today's Vitals   02/08/24 1306  Weight: 157 lb (71.2 kg)  Height: 4' 11.75 (1.518 m)   Body mass index is 30.92 kg/m.     02/08/2024    1:12 PM 01/07/2023   10:37 AM 01/05/2022   11:11 AM 01/03/2021   11:22 AM 01/03/2020   11:19 AM 12/02/2018   11:41 AM 11/24/2017    8:35 AM  Advanced Directives  Does Patient Have a  Medical Advance Directive? Yes Yes Yes Yes Yes Yes Yes   Type of Estate agent of [place];Living will Healthcare Power of [place];Living will Healthcare Power of [place];Living will Healthcare Power of [place];Living will Healthcare Power of [place];Living will Healthcare Power of [place];Living will Healthcare Power of [place];Living will  Does patient want to make changes to medical advance directive?  No - Patient declined Yes (Inpatient - patient defers changing a medical advance directive and declines information at this time)    No - Patient declined   Copy of Healthcare Power of Attorney in Chart? Yes - validated most recent copy scanned in chart (See row information) Yes - validated most recent copy scanned in chart (See row information) Yes - validated most recent copy scanned in chart (See row information) No - copy requested No - copy requested No - copy requested  No - copy requested      Data saved with a previous flowsheet row definition    Current Medications (verified) Outpatient Encounter Medications as of 02/08/2024  Medication Sig   alendronate  (FOSAMAX ) 70 MG tablet Take 1 tablet (70 mg total) by mouth every 7 (seven) days. Take with a full glass of water on an empty stomach.   aspirin  EC 325 MG tablet Take 1 tablet (325 mg total) by mouth daily.   azelastine (ASTELIN) 0.1 % nasal spray Place 1-2 sprays into both nostrils 2 (two) times daily. Use in each nostril as directed   Calcium  Carbonate-Vit D-Min 600-400 MG-UNIT TABS Take 2 tablets by mouth daily.   Cranberry 500  MG TABS Take 1 tablet by mouth daily.   cyanocobalamin  (VITAMIN B12) 1000 MCG tablet Take 1,000 mcg by mouth daily.   famotidine  (PEPCID ) 20 MG tablet Take 1 tablet (20 mg total) by mouth 2 (two) times daily.   fexofenadine (ALLEGRA) 180 MG tablet Take 180 mg by mouth daily as needed (during allergy season).    fluticasone  (FLONASE ) 50 MCG/ACT nasal spray USE 2 SPRAYS IN EACH NOSTRIL  ONCE DAILY AS NEEDED FOR ALLERGIES/RHINTIS   hydrochlorothiazide  (HYDRODIURIL ) 25 MG tablet TAKE 1 TABLET BY MOUTH ONCE DAILY   lovastatin  (MEVACOR ) 20 MG tablet Take 1 tablet (20 mg total) by mouth every other day.   Multiple Vitamin (MULTIVITAMIN) tablet Take 1 tablet by mouth daily.   potassium chloride  (KLOR-CON ) 10 MEQ tablet Take 2 tablets (20 mEq total) by mouth daily.   No facility-administered encounter medications on file as of 02/08/2024.    Allergies (verified) Buspar  [buspirone ], Lipitor [atorvastatin ], Sulfa antibiotics, Tetanus toxoid, Wellbutrin  [bupropion ], Acetaminophen, and Septra [sulfamethoxazole-trimethoprim]   History: Past Medical History:  Diagnosis Date   Allergy    allergic rhinitis   Anxiety    Son passed/2017   Arthritis    OA   Depression    Grief/son passed 11/27/2015   Fibrocystic breast    GERD (gastroesophageal reflux disease)    Hyperlipidemia    Obesity    Osteopenia    Stroke (HCC)    cerebral infarction   Tibia fracture 08/03/2010   playing golf    Past Surgical History:  Procedure Laterality Date   BREAST CYST ASPIRATION  1980s   aspirated breast lump   COLONOSCOPY  08/03/2010   ESOPHAGOGASTRODUODENOSCOPY ENDOSCOPY  09/18/2014   INGUINAL HERNIA REPAIR Right 08/03/1954   TUBAL LIGATION  08/1974   lap   Family History  Problem Relation Age of Onset   Arthritis Mother    Hyperlipidemia Mother    Hypertension Mother    COPD Mother    Asthma Mother    Stroke Father    Heart disease Father    Pancreatic cancer Maternal Grandfather    Heart disease Maternal Grandfather    Arthritis Maternal Grandmother    Hypertension Maternal Grandmother    Stroke Maternal Grandmother    Colon cancer Neg Hx    Esophageal cancer Neg Hx    Rectal cancer Neg Hx    Stomach cancer Neg Hx    Breast cancer Neg Hx    Social History   Socioeconomic History   Marital status: Married    Spouse name: Not on file   Number of children: 1   Years of  education: 14   Highest education level: Some college, no degree  Occupational History   Occupation: retired  Tobacco Use   Smoking status: Never   Smokeless tobacco: Never  Vaping Use   Vaping status: Never Used  Substance and Sexual Activity   Alcohol use: Yes    Alcohol/week: 3.0 standard drinks of alcohol    Types: 3 Glasses of wine per week    Comment: Occasional wine   Drug use: No   Sexual activity: Not Currently    Birth control/protection: Post-menopausal    Comment: One partner/husband  Other Topics Concern   Not on file  Social History Narrative   Patient is married with one child.   Patient is left handed.   Patient has 14 yrs of education.   Patient drinks 1 and 1/2 cup daily.   Social Drivers of Dispensing optician  Resource Strain: Low Risk  (02/08/2024)   Overall Financial Resource Strain (CARDIA)    Difficulty of Paying Living Expenses: Not hard at all  Food Insecurity: No Food Insecurity (02/08/2024)   Hunger Vital Sign    Worried About Running Out of Food in the Last Year: Never true    Ran Out of Food in the Last Year: Never true  Transportation Needs: No Transportation Needs (02/08/2024)   PRAPARE - Administrator, Civil Service (Medical): No    Lack of Transportation (Non-Medical): No  Physical Activity: Insufficiently Active (02/08/2024)   Exercise Vital Sign    Days of Exercise per Week: 4 days    Minutes of Exercise per Session: 30 min  Stress: No Stress Concern Present (02/08/2024)   [employer] of Occupational Health - Occupational Stress Questionnaire    Feeling of Stress: Not at all  Social Connections: Socially Integrated (02/08/2024)   Social Connection and Isolation Panel    Frequency of Communication with Friends and Family: More than three times a week    Frequency of Social Gatherings with Friends and Family: More than three times a week    Attends Religious Services: More than 4 times per year    Active Member of [employer] or  Organizations: Yes    Attends Engineer, structural: More than 4 times per year    Marital Status: Married    Tobacco Counseling Counseling given: Not Answered    Clinical Intake:  Pre-visit preparation completed: Yes  Pain : No/denies pain     BMI - recorded: 30.92 Nutritional Status: BMI > 30  Obese Nutritional Risks: None Diabetes: No  Lab Results  Component Value Date   HGBA1C 6.0 01/20/2024   HGBA1C 6.0 01/18/2023   HGBA1C 6.0 01/06/2022     How often do you need to have someone help you when you read instructions, pamphlets, or other written materials from your doctor or pharmacy?: 1 - Never  Interpreter Needed?: No  Information entered by :: B.Janaki Exley,LPN   Activities of Daily Living     02/01/2024    8:36 AM 01/06/2024   11:39 AM  In your present state of health, do you have any difficulty performing the following activities:  Hearing? 1 1  Vision? 0 0  Difficulty concentrating or making decisions? 0 0  Walking or climbing stairs? 0 0  Dressing or bathing? 0 0  Doing errands, shopping? 0 0  Preparing Food and eating ? N N  Using the Toilet? N N  In the past six months, have you accidently leaked urine? N N  Do you have problems with loss of bowel control? N N  Managing your Medications? N N  Managing your Finances? N N  Housekeeping or managing your Housekeeping? N N    Patient Care Team: Tower, Laine LABOR, MD as PCP - General Myrna, Adine Anes, MD as Consulting Physician (Ophthalmology) [facility]  I have updated your Care Teams any recent Medical Services you may have received from other providers in the past year.     Assessment:   This is a routine wellness examination for [place].  Hearing/Vision screen Hearing Screening - Comments:: Pt says her hearing is good w/hearing aids Vision Screening - Comments:: Pt says her vision is good;readers only Dr Nona w/visits   Goals Addressed              This Visit's Progress    DIET - EAT MORE FRUITS AND VEGETABLES  On track    COMPLETED: Increase physical activity       Starting 12/02/2018, I will continue to exercise for 45 minutes daily.      COMPLETED: Patient Stated       01/03/2020, I will continue to play golf 2 days a week for over 1 hour.      COMPLETED: Patient Stated       01/03/2021, I will continue to walk daily for about 1 hour and play golf during the week.      COMPLETED: Patient Stated       Exercise everyday.       Depression Screen     02/08/2024    1:10 PM 01/27/2024   10:58 AM 04/15/2023   11:44 AM 01/25/2023    1:58 PM 01/07/2023   10:36 AM 03/10/2022   10:23 AM 01/05/2022   11:08 AM  PHQ 2/9 Scores  PHQ - 2 Score 0 0 0 0 0 0 0  PHQ- 9 Score  0 0 0   0    Fall Risk     02/01/2024    8:36 AM 01/27/2024   10:58 AM 01/06/2024   11:39 AM 01/04/2024   10:03 AM 04/15/2023   11:44 AM  Fall Risk   Falls in the past year? 0 0 0 0 0  Number falls in past yr: 0 0 0 0 0  Injury with Fall? 0 0 0 0 0  Risk for fall due to : No Fall Risks No Fall Risks   No Fall Risks  Follow up Education provided;Falls prevention discussed Falls evaluation completed   Falls evaluation completed    MEDICARE RISK AT HOME:  Medicare Risk at Home Any stairs in or around the home?: (Patient-Rptd) No If so, are there any without handrails?: (Patient-Rptd) No Home free of loose throw rugs in walkways, pet beds, electrical cords, etc?: (Patient-Rptd) Yes Adequate lighting in your home to reduce risk of falls?: (Patient-Rptd) Yes Life alert?: (Patient-Rptd) No Use of a cane, walker or w/c?: (Patient-Rptd) No Grab bars in the bathroom?: (Patient-Rptd) Yes Shower chair or bench in shower?: (Patient-Rptd) Yes Elevated toilet seat or a handicapped toilet?: (Patient-Rptd) Yes  TIMED UP AND GO:  Was the test performed?  No  Cognitive Function: 6CIT completed    01/03/2021   11:37 AM 01/03/2020   11:31 AM 12/02/2018    1:37 PM 11/24/2017    8:34 AM  11/23/2016    8:14 AM  MMSE - Mini Mental State Exam  Not completed: Refused      Orientation to time  5 5 5 5    Orientation to Place  5 5 5 5    Registration  3 3 3 3    Attention/ Calculation  5 0 0 0   Recall  3 3 3 3    Language- name 2 objects   0 0 0   Language- repeat  1 1 1 1   Language- follow 3 step command   0 3 3   Language- read & follow direction   0 0 0   Write a sentence   0 0 0   Copy design   0 0 0   Total score   17 20 20       Data saved with a previous flowsheet row definition        02/08/2024    1:13 PM 01/07/2023   10:38 AM 01/05/2022   11:17 AM  6CIT Screen  What Year? 0 points 0 points  0 points  What month? 0 points 0 points 0 points  What time? 0 points 0 points 0 points  Count back from 20 0 points 0 points 0 points  Months in reverse 0 points 0 points 0 points  Repeat phrase 0 points 0 points 0 points  Total Score 0 points 0 points 0 points    Immunizations Immunization History  Administered Date(s) Administered   Influenza Split 04/29/2011, 07/20/2012   Influenza,inj,Quad PF,6+ Mos 05/30/2014, 05/24/2015, 06/02/2016, 06/08/2017, 06/09/2018, 04/25/2019, 04/18/2020   Influenza-Unspecified 05/25/2013, 05/11/2022, 04/22/2023   PFIZER(Purple Top)SARS-COV-2 Vaccination 09/12/2019, 10/03/2019, 05/15/2020   Pneumococcal Conjugate-13 11/16/2014   Pneumococcal Polysaccharide-23 11/19/2015   Zoster, Live 02/21/2014    Screening Tests Health Maintenance  Topic Date Due   COVID-19 Vaccine (4 - 2024-25 season) 02/11/2025 (Originally 04/04/2023)   Zoster Vaccines- Shingrix (1 of 2) 04/28/2025 (Originally 09/23/1969)   INFLUENZA VACCINE  03/03/2024   Fecal DNA (Cologuard)  06/12/2024   MAMMOGRAM  07/08/2024   Medicare Annual Wellness (AWV)  02/07/2025   Pneumococcal Vaccine: 50+ Years  Completed   DEXA SCAN  Completed   Hepatitis C Screening  Completed   Hepatitis B Vaccines  Aged Out   HPV VACCINES  Aged Out   Meningococcal B Vaccine  Aged Out    DTaP/Tdap/Td  Discontinued   Colonoscopy  Discontinued    Health Maintenance  There are no preventive care reminders to display for this patient. Health Maintenance Items Addressed: None at this time  Additional Screening:  Vision Screening: Recommended annual ophthalmology exams for early detection of glaucoma and other disorders of the eye. Would you like a referral to an eye doctor? No    Dental Screening: Recommended annual dental exams for proper oral hygiene  Community Resource Referral / Chronic Care Management: CRR required this visit?  No   CCM required this visit?  No   Plan:    I have personally reviewed and noted the following in the patient's chart:   Medical and social history Use of alcohol, tobacco or illicit drugs  Current medications and supplements including opioid prescriptions. Patient is not currently taking opioid prescriptions. Functional ability and status Nutritional status Physical activity Advanced directives List of other physicians Hospitalizations, surgeries, and ER visits in previous 12 months Vitals Screenings to include cognitive, depression, and falls Referrals and appointments  In addition, I have reviewed and discussed with patient certain preventive protocols, quality metrics, and best practice recommendations. A written personalized care plan for preventive services as well as general preventive health recommendations were provided to patient.   Erminio LITTIE Saris, LPN   09/03/7972   After Visit Summary: (MyChart) Due to this being a telephonic visit, the after visit summary with patients personalized plan was offered to patient via MyChart   Notes: Nothing significant to report at this time.

## 2024-02-14 ENCOUNTER — Other Ambulatory Visit: Payer: Self-pay | Admitting: Family Medicine

## 2024-02-14 ENCOUNTER — Ambulatory Visit: Payer: Self-pay | Admitting: Family Medicine

## 2024-02-14 ENCOUNTER — Other Ambulatory Visit (INDEPENDENT_AMBULATORY_CARE_PROVIDER_SITE_OTHER)

## 2024-02-14 DIAGNOSIS — E538 Deficiency of other specified B group vitamins: Secondary | ICD-10-CM | POA: Diagnosis not present

## 2024-02-14 DIAGNOSIS — E876 Hypokalemia: Secondary | ICD-10-CM | POA: Diagnosis not present

## 2024-02-14 LAB — BASIC METABOLIC PANEL WITH GFR
BUN: 21 mg/dL (ref 6–23)
CO2: 30 meq/L (ref 19–32)
Calcium: 9.4 mg/dL (ref 8.4–10.5)
Chloride: 100 meq/L (ref 96–112)
Creatinine, Ser: 0.98 mg/dL (ref 0.40–1.20)
GFR: 56.51 mL/min — ABNORMAL LOW (ref >60.00)
Glucose, Bld: 79 mg/dL (ref 70–99)
Potassium: 4 meq/L (ref 3.5–5.1)
Sodium: 140 meq/L (ref 135–145)

## 2024-02-14 LAB — VITAMIN B12: Vitamin B-12: 621 pg/mL (ref 211–911)

## 2024-03-24 ENCOUNTER — Other Ambulatory Visit: Payer: Self-pay | Admitting: Family Medicine

## 2024-04-15 ENCOUNTER — Other Ambulatory Visit: Payer: Self-pay | Admitting: Family Medicine

## 2024-04-17 NOTE — Telephone Encounter (Signed)
 Noted from pharmacy:  SHE IS TAKING 2 PER DAY!!   Potassium Last filled:  03/08/24 Last OV:  01/27/24, annual visit Next OV:  none

## 2024-05-01 ENCOUNTER — Telehealth: Payer: Self-pay | Admitting: Family Medicine

## 2024-05-01 DIAGNOSIS — I10 Essential (primary) hypertension: Secondary | ICD-10-CM

## 2024-05-01 DIAGNOSIS — E7800 Pure hypercholesterolemia, unspecified: Secondary | ICD-10-CM

## 2024-05-01 DIAGNOSIS — R7303 Prediabetes: Secondary | ICD-10-CM

## 2024-05-01 DIAGNOSIS — E538 Deficiency of other specified B group vitamins: Secondary | ICD-10-CM

## 2024-05-01 NOTE — Telephone Encounter (Signed)
-----   Message from Veva JINNY Ferrari sent at 04/17/2024  4:12 PM EDT ----- Regarding: Lab orders for Wed, 10.1.25 Lab orders for a 3 month follow up appt.

## 2024-05-04 ENCOUNTER — Other Ambulatory Visit (INDEPENDENT_AMBULATORY_CARE_PROVIDER_SITE_OTHER)

## 2024-05-04 ENCOUNTER — Ambulatory Visit: Payer: Self-pay | Admitting: Family Medicine

## 2024-05-04 DIAGNOSIS — R7303 Prediabetes: Secondary | ICD-10-CM

## 2024-05-04 DIAGNOSIS — E538 Deficiency of other specified B group vitamins: Secondary | ICD-10-CM | POA: Diagnosis not present

## 2024-05-04 DIAGNOSIS — I10 Essential (primary) hypertension: Secondary | ICD-10-CM | POA: Diagnosis not present

## 2024-05-04 LAB — BASIC METABOLIC PANEL WITH GFR
BUN: 20 mg/dL (ref 6–23)
CO2: 29 meq/L (ref 19–32)
Calcium: 9.3 mg/dL (ref 8.4–10.5)
Chloride: 103 meq/L (ref 96–112)
Creatinine, Ser: 0.84 mg/dL (ref 0.40–1.20)
GFR: 67.88 mL/min (ref >60.00)
Glucose, Bld: 79 mg/dL (ref 70–99)
Potassium: 3.8 meq/L (ref 3.5–5.1)
Sodium: 140 meq/L (ref 135–145)

## 2024-05-04 LAB — HEMOGLOBIN A1C: Hgb A1c MFr Bld: 5.8 % (ref 4.6–6.5)

## 2024-05-04 LAB — VITAMIN B12: Vitamin B-12: 590 pg/mL (ref 211–911)

## 2024-05-24 ENCOUNTER — Other Ambulatory Visit: Payer: Self-pay | Admitting: Family Medicine

## 2024-05-24 DIAGNOSIS — Z1231 Encounter for screening mammogram for malignant neoplasm of breast: Secondary | ICD-10-CM

## 2024-07-01 ENCOUNTER — Other Ambulatory Visit: Payer: Self-pay | Admitting: Family Medicine

## 2024-07-12 ENCOUNTER — Ambulatory Visit
Admission: RE | Admit: 2024-07-12 | Discharge: 2024-07-12 | Disposition: A | Discharge status: Home / Self Care | Source: Ambulatory Visit | Attending: Family Medicine | Admitting: Family Medicine

## 2024-07-12 DIAGNOSIS — Z1231 Encounter for screening mammogram for malignant neoplasm of breast: Secondary | ICD-10-CM

## 2024-07-18 ENCOUNTER — Ambulatory Visit: Payer: Self-pay | Admitting: Family Medicine

## 2024-07-22 ENCOUNTER — Other Ambulatory Visit: Payer: Self-pay | Admitting: Family Medicine

## 2025-02-08 ENCOUNTER — Ambulatory Visit
